# Patient Record
Sex: Male | Born: 1950 | Hispanic: No | Marital: Married | Smoking: Never smoker
Health system: Southern US, Community
[De-identification: ages and names within clinical notes are randomized; demographics above are authoritative.]

## PROBLEM LIST (undated history)

## (undated) DIAGNOSIS — Z86718 Personal history of other venous thrombosis and embolism: Secondary | ICD-10-CM

## (undated) DIAGNOSIS — M24561 Contracture, right knee: Secondary | ICD-10-CM

## (undated) DIAGNOSIS — R002 Palpitations: Secondary | ICD-10-CM

## (undated) DIAGNOSIS — M1990 Unspecified osteoarthritis, unspecified site: Secondary | ICD-10-CM

## (undated) DIAGNOSIS — K219 Gastro-esophageal reflux disease without esophagitis: Secondary | ICD-10-CM

## (undated) DIAGNOSIS — I10 Essential (primary) hypertension: Secondary | ICD-10-CM

## (undated) DIAGNOSIS — Z860100 Personal history of colon polyps, unspecified: Secondary | ICD-10-CM

## (undated) DIAGNOSIS — I739 Peripheral vascular disease, unspecified: Secondary | ICD-10-CM

## (undated) DIAGNOSIS — T8482XA Fibrosis due to internal orthopedic prosthetic devices, implants and grafts, initial encounter: Secondary | ICD-10-CM

## (undated) HISTORY — DX: Gastro-esophageal reflux disease without esophagitis: K21.9

## (undated) HISTORY — DX: Essential (primary) hypertension: I10

## (undated) HISTORY — PX: INGUINAL HERNIA REPAIR: SUR1180

## (undated) HISTORY — DX: Palpitations: R00.2

---

## 2014-07-09 ENCOUNTER — Ambulatory Visit (INDEPENDENT_AMBULATORY_CARE_PROVIDER_SITE_OTHER): Payer: BC Managed Care – PPO | Admitting: Internal Medicine

## 2014-07-09 ENCOUNTER — Encounter: Payer: Self-pay | Admitting: Internal Medicine

## 2014-07-09 ENCOUNTER — Other Ambulatory Visit (HOSPITAL_COMMUNITY): Payer: Self-pay | Admitting: *Deleted

## 2014-07-09 VITALS — BP 110/80 | HR 60 | Ht 68.0 in | Wt 213.0 lb

## 2014-07-09 DIAGNOSIS — Z139 Encounter for screening, unspecified: Secondary | ICD-10-CM

## 2014-07-09 DIAGNOSIS — I493 Ventricular premature depolarization: Secondary | ICD-10-CM

## 2014-07-09 NOTE — Patient Instructions (Signed)
**Note De-Identified Bryan Carroll Obfuscation** Your physician recommends that you continue on your current medications as directed. Please refer to the Current Medication list given to you today.  Your physician has recommended that you wear a 24 hour holter monitor. Holter monitors are medical devices that record the heart's electrical activity. Doctors most often use these monitors to diagnose arrhythmias. Arrhythmias are problems with the speed or rhythm of the heartbeat. The monitor is a small, portable device. You can wear one while you do your normal daily activities. This is usually used to diagnose what is causing palpitations/syncope (passing out).  Dr Tenny Crawoss recommends that you have a Vascuscreen, we will arrange.  Your physician recommends that you schedule a follow-up appointment in: depending on test results

## 2014-07-09 NOTE — Progress Notes (Signed)
HPI Patient is a 63 yo who is referred for evaluation of PVCs Pt has history of HTN  [facility]  Seen recently Pt feels palpitations.  Lately has been drinking 4 cups coffee per day Eating habits have changed No dizziness  Breathing OK   DOesn't exercise   No Known Allergies  Current Outpatient Prescriptions  Medication Sig Dispense Refill  . aspirin 81 MG tablet Take 81 mg by mouth daily.    Marland Kitchen. GARLIC PO Take by mouth 2 (two) times daily.    Marland Kitchen. lisinopril (PRINIVIL,ZESTRIL) 20 MG tablet Take 20 mg by mouth daily.    . Omega-3 Fatty Acids (FISH OIL) 500 MG CAPS Take by mouth.    . Red Yeast Rice 600 MG CAPS Take by mouth.     No current facility-administered medications for this visit.    Past Medical History  Diagnosis Date  . Hypertension   . Palpitations   . Trigeminy   . PVC (premature ventricular contraction)   . GERD (gastroesophageal reflux disease)   . Colon polyp     No past surgical history on file.  Family History  Problem Relation Age of Onset  . Hypertension Mother   . Heart disease Brother   . Heart disease Brother     History   Social History  . Marital Status: Married    Spouse Name: N/A    Number of Children: N/A  . Years of Education: N/A   Occupational History  . Not on file.   Social History Main Topics  . Smoking status: Never Smoker   . Smokeless tobacco: Not on file  . Alcohol Use: 0.6 - 1.2 oz/week    0 Not specified, 1-2 Glasses of wine per week  . Drug Use: No  . Sexual Activity: Not on file   Other Topics Concern  . Not on file   Social History Narrative    Review of Systems:  All systems reviewed.  They are negative to the above problem except as previously stated.  Vital Signs: BP 110/80 mmHg  Pulse 60  Ht 5\' 8"  (1.727 m)  Wt 213 lb (96.616 kg)  BMI 32.39 kg/m2  Physical Exam  HEENT:  Normocephalic, atraumatic. EOMI, PERRLA.  Neck: JVP is normal.  No bruits.  Lungs: clear to auscultation. No rales no  wheezes.  Heart: Regular rate and rhythm. Normal S1, S2. No S3.   No significant murmurs. PMI not displaced.  Abdomen:  Supple, nontender. Normal bowel sounds. No masses. No hepatomegaly.  Extremities:   Good distal pulses throughout. No lower extremity edema.  Musculoskeletal :moving all extremities.  Neuro:   alert and oriented x3.  CN II-XII grossly intact.  EKG 64 pbm  Occasional PAC/PVC  07/05/14 Assessment and Plan:  Palpitations.   Would set up for holter monitor to evaluate PVC burden  If high would set up echo  Dyslipidemia.  On red yeast rice.  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

## 2014-07-18 ENCOUNTER — Encounter: Payer: Self-pay | Admitting: *Deleted

## 2014-07-18 ENCOUNTER — Encounter (INDEPENDENT_AMBULATORY_CARE_PROVIDER_SITE_OTHER): Payer: BC Managed Care – PPO

## 2014-07-18 ENCOUNTER — Ambulatory Visit (HOSPITAL_COMMUNITY): Payer: BC Managed Care – PPO | Attending: Cardiology | Admitting: *Deleted

## 2014-07-18 DIAGNOSIS — Z139 Encounter for screening, unspecified: Secondary | ICD-10-CM

## 2014-07-18 DIAGNOSIS — Z0000 Encounter for general adult medical examination without abnormal findings: Secondary | ICD-10-CM

## 2014-07-18 DIAGNOSIS — I493 Ventricular premature depolarization: Secondary | ICD-10-CM

## 2014-07-18 NOTE — Progress Notes (Signed)
vascuscreen (aorta carotid abi) performed

## 2014-07-18 NOTE — Progress Notes (Signed)
Patient ID: Bryan Carroll, male   DOB: 08-23-1950, 63 y.o.   MRN: [id number] [employer] 24 hour holter monitor applied to patient.

## 2014-08-19 ENCOUNTER — Telehealth: Payer: Self-pay | Admitting: *Deleted

## 2014-08-19 DIAGNOSIS — I6522 Occlusion and stenosis of left carotid artery: Secondary | ICD-10-CM

## 2014-08-19 NOTE — Telephone Encounter (Signed)
Message     Patinet seen in December    I just now saw results of vascular screen he had     There appears to be mild cholesterol plaquing of L internal carotid artery    With this, patinet needs tighter control of cholesterol so it doesn't worsen    He is on red yeast rice I would stop that since not clear how much he is getting of active agent. Needs more Controlled amount    WOuld recomm lipitor 10 mg F/U lipid panel in 8 weeks with AST

## 2014-08-19 NOTE — Telephone Encounter (Signed)
24 hr holter reviewed by Dr. Tenny Crawoss: " Sinus rhythm - rates 47 to 108 bpm; average HR 61 bpm Rare PAC & rare PVC No sig pauses No diary entries Rec: no change; holter is normal."

## 2014-08-19 NOTE — Telephone Encounter (Signed)
Left message for patient to call back  

## 2014-09-19 MED ORDER — ATORVASTATIN CALCIUM 10 MG PO TABS: 10.0000 mg | ORAL_TABLET | Freq: Every day | ORAL | Status: DC

## 2014-09-19 NOTE — Telephone Encounter (Signed)
Spoke with patient who is out of the country until 10/15/14. He will pick up Lipitor when he returns. He will discontinue the red yeast rice at that time.  Setup up for blood work for May.  Pt unable to schedule the appointment at this time. Pt is appreciative for the call.

## 2016-04-08 DIAGNOSIS — M25561 Pain in right knee: Secondary | ICD-10-CM | POA: Diagnosis not present

## 2016-04-08 DIAGNOSIS — Z23 Encounter for immunization: Secondary | ICD-10-CM | POA: Diagnosis not present

## 2016-04-08 DIAGNOSIS — L309 Dermatitis, unspecified: Secondary | ICD-10-CM | POA: Diagnosis not present

## 2016-04-09 ENCOUNTER — Other Ambulatory Visit: Payer: Self-pay | Admitting: Family Medicine

## 2016-04-09 ENCOUNTER — Ambulatory Visit
Admission: RE | Admit: 2016-04-09 | Discharge: 2016-04-09 | Disposition: A | Discharge status: Home / Self Care | Payer: BLUE CROSS/BLUE SHIELD | Source: Ambulatory Visit | Attending: Family Medicine | Admitting: Family Medicine

## 2016-04-09 DIAGNOSIS — M179 Osteoarthritis of knee, unspecified: Secondary | ICD-10-CM | POA: Diagnosis not present

## 2016-04-09 DIAGNOSIS — M25561 Pain in right knee: Secondary | ICD-10-CM

## 2016-07-02 DIAGNOSIS — H1045 Other chronic allergic conjunctivitis: Secondary | ICD-10-CM | POA: Diagnosis not present

## 2016-11-18 DIAGNOSIS — M25561 Pain in right knee: Secondary | ICD-10-CM | POA: Diagnosis not present

## 2016-11-18 DIAGNOSIS — E785 Hyperlipidemia, unspecified: Secondary | ICD-10-CM | POA: Diagnosis not present

## 2016-11-25 DIAGNOSIS — M170 Bilateral primary osteoarthritis of knee: Secondary | ICD-10-CM | POA: Diagnosis not present

## 2016-11-25 DIAGNOSIS — M25561 Pain in right knee: Secondary | ICD-10-CM | POA: Diagnosis not present

## 2017-02-08 DIAGNOSIS — M25561 Pain in right knee: Secondary | ICD-10-CM | POA: Diagnosis not present

## 2017-03-30 DIAGNOSIS — R6882 Decreased libido: Secondary | ICD-10-CM | POA: Diagnosis not present

## 2017-03-30 DIAGNOSIS — E785 Hyperlipidemia, unspecified: Secondary | ICD-10-CM | POA: Diagnosis not present

## 2017-03-30 DIAGNOSIS — Z23 Encounter for immunization: Secondary | ICD-10-CM | POA: Diagnosis not present

## 2017-03-30 DIAGNOSIS — M179 Osteoarthritis of knee, unspecified: Secondary | ICD-10-CM | POA: Diagnosis not present

## 2017-03-30 DIAGNOSIS — Z01818 Encounter for other preprocedural examination: Secondary | ICD-10-CM | POA: Diagnosis not present

## 2017-05-02 ENCOUNTER — Ambulatory Visit: Payer: Self-pay | Admitting: Orthopedic Surgery

## 2017-05-17 ENCOUNTER — Other Ambulatory Visit (HOSPITAL_COMMUNITY): Payer: Self-pay | Admitting: Emergency Medicine

## 2017-05-17 NOTE — Patient Instructions (Addendum)
Gianlucca Szymborski  05/17/2017   Your procedure is scheduled on: 05-26-17  Report to [facility] Main  Entrance Take [place]  elevators to 3rd floor to  Short Stay Center at [phone].   Call this number if you have problems the morning of surgery [phone]    Remember: ONLY 1 PERSON MAY GO WITH YOU TO SHORT STAY TO GET  READY MORNING OF YOUR SURGERY.  Do not eat food or drink liquids :After Midnight.     Take these medicines the morning of surgery with A SIP OF WATER: NONE                                You may not have any metal on your body including hair pins and              piercings  Do not wear jewelry, make-up, lotions, powders or perfumes, deodorant                         Men may shave face and neck.   Do not bring valuables to the hospital. [facility] IS NOT             RESPONSIBLE   FOR VALUABLES.  Contacts, dentures or bridgework may not be worn into surgery.  Leave suitcase in the car. After surgery it may be brought to your room.                Please read over the following fact sheets you were given: _____________________________________________________________________            [facility] - Preparing for Surgery Before surgery, you can play an important role.  Because skin is not sterile, your skin needs to be as free of germs as possible.  You can reduce the number of germs on your skin by washing with CHG (chlorahexidine gluconate) soap before surgery.  CHG is an antiseptic cleaner which kills germs and bonds with the skin to continue killing germs even after washing. Please DO NOT use if you have an allergy to CHG or antibacterial soaps.  If your skin becomes reddened/irritated stop using the CHG and inform your nurse when you arrive at Short Stay. Do not shave (including legs and underarms) for at least 48 hours prior to the first CHG shower.  You may shave your face/neck. Please follow these instructions carefully:  1.  Shower  with CHG Soap the night before surgery and the  morning of Surgery.  2.  If you choose to wash your hair, wash your hair first as usual with your  normal  shampoo.  3.  After you shampoo, rinse your hair and body thoroughly to remove the  shampoo.                           4.  Use CHG as you would any other liquid soap.  You can apply chg directly  to the skin and wash                       Gently with a scrungie or clean washcloth.  5.  Apply the CHG Soap to your body ONLY FROM THE NECK DOWN.   Do not use on face/ open  Wound or open sores. Avoid contact with eyes, ears mouth and genitals (private parts).                       Wash face,  Genitals (private parts) with your normal soap.             6.  Wash thoroughly, paying special attention to the area where your surgery  will be performed.  7.  Thoroughly rinse your body with warm water from the neck down.  8.  DO NOT shower/wash with your normal soap after using and rinsing off  the CHG Soap.                9.  Pat yourself dry with a clean towel.            10.  Wear clean pajamas.            11.  Place clean sheets on your bed the night of your first shower and do not  sleep with pets. Day of Surgery : Do not apply any lotions/deodorants the morning of surgery.  Please wear clean clothes to the hospital/surgery center.  FAILURE TO FOLLOW THESE INSTRUCTIONS MAY RESULT IN THE CANCELLATION OF YOUR SURGERY PATIENT SIGNATURE_________________________________  NURSE SIGNATURE__________________________________  ________________________________________________________________________   Adam Phenix  An incentive spirometer is a tool that can help keep your lungs clear and active. This tool measures how well you are filling your lungs with each breath. Taking long deep breaths may help reverse or decrease the chance of developing breathing (pulmonary) problems (especially infection) following:  A long period  of time when you are unable to move or be active. BEFORE THE PROCEDURE   If the spirometer includes an indicator to show your best effort, your nurse or respiratory therapist will set it to a desired goal.  If possible, sit up straight or lean slightly forward. Try not to slouch.  Hold the incentive spirometer in an upright position. INSTRUCTIONS FOR USE  1. Sit on the edge of your bed if possible, or sit up as far as you can in bed or on a chair. 2. Hold the incentive spirometer in an upright position. 3. Breathe out normally. 4. Place the mouthpiece in your mouth and seal your lips tightly around it. 5. Breathe in slowly and as deeply as possible, raising the piston or the ball toward the top of the column. 6. Hold your breath for 3-5 seconds or for as long as possible. Allow the piston or ball to fall to the bottom of the column. 7. Remove the mouthpiece from your mouth and breathe out normally. 8. Rest for a few seconds and repeat Steps 1 through 7 at least 10 times every 1-2 hours when you are awake. Take your time and take a few normal breaths between deep breaths. 9. The spirometer may include an indicator to show your best effort. Use the indicator as a goal to work toward during each repetition. 10. After each set of 10 deep breaths, practice coughing to be sure your lungs are clear. If you have an incision (the cut made at the time of surgery), support your incision when coughing by placing a pillow or rolled up towels firmly against it. Once you are able to get out of bed, walk around indoors and cough well. You may stop using the incentive spirometer when instructed by your caregiver.  RISKS AND COMPLICATIONS  Take your time so you do not get  dizzy or light-headed.  If you are in pain, you may need to take or ask for pain medication before doing incentive spirometry. It is harder to take a deep breath if you are having pain. AFTER USE  Rest and breathe slowly and easily.  It  can be helpful to keep track of a log of your progress. Your caregiver can provide you with a simple table to help with this. If you are using the spirometer at home, follow these instructions: SEEK MEDICAL CARE IF:   You are having difficultly using the spirometer.  You have trouble using the spirometer as often as instructed.  Your pain medication is not giving enough relief while using the spirometer.  You develop fever of 100.5 F (38.1 C) or higher. SEEK IMMEDIATE MEDICAL CARE IF:   You cough up bloody sputum that had not been present before.  You develop fever of 102 F (38.9 C) or greater.  You develop worsening pain at or near the incision site. MAKE SURE YOU:   Understand these instructions.  Will watch your condition.  Will get help right away if you are not doing well or get worse. Document Released: 11/29/2006 Document Revised: 10/11/2011 Document Reviewed: 01/30/2007 [facility] Patient Information 2014 [place], [place].

## 2017-05-17 NOTE — Progress Notes (Signed)
LOV cardiology Dr Dietrich Pates 07-09-14 epic

## 2017-05-18 ENCOUNTER — Ambulatory Visit: Payer: Self-pay | Admitting: Orthopedic Surgery

## 2017-05-18 NOTE — H&P (Signed)
Bryan Carroll DOB: 06/06/1951 Married / Language: English / Race: White Male  H&P Date: 05/18/17  Chief Complaint: Right knee pain  History of Present Illness The patient is a 66 year old male who comes in today for a preoperative History and Physical. The patient is scheduled for a right total knee arthroplasty to be performed by Dr. Javier Docker, MD at [facility] on 05/26/2017. Bryan Carroll reports chronic progressively worsening R knee pain refractory to injection therapy, bracing, activity modifications, quad strengthening, medications, and relative rest. Pain is limiting ADLs and quality of life at this point and he desires to proceed with surgery.  Dr. Shelle Iron and the patient mutually agreed to proceed with a total knee replacement. Risks and benefits of the procedure were discussed including stiffness, suboptimal range of motion, persistent pain, infection requiring removal of prosthesis and reinsertion, need for prophylactic antibiotics in the future, for example, dental procedures, possible need for manipulation, revision in the future and also anesthetic complications including DVT, PE, etc. We discussed the perioperative course, time in the hospital, postoperative recovery and the need for elevation to control swelling. We also discussed the predicted range of motion and the probability that squatting and kneeling would be unobtainable in the future. In addition, postoperative anticoagulation was discussed. We have obtained preoperative medical clearance as necessary. Provided illustrated handout and discussed it in detail. They will enroll in the total joint replacement educational forum at the hospital.  Bryan Carroll.  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 Post-Surgical Plans  Home with HHPT. wife as caregiver  Medication History Fish Oil (1000MG  Capsule, Oral) Active. Lisinopril (20MG  Tablet, Oral) Active. Aspirin (81MG  Tablet, Oral) Active.  Past Surgical History Hernia Repair   Physical Exam General Mental Status -Alert, cooperative and good historian. General Appearance-pleasant, Not in acute distress. Orientation-Oriented X3. Build & Nutrition-Well nourished and Well developed.  Head and Neck Head-normocephalic, atraumatic . Neck Global Assessment - supple, no bruit auscultated on the right, no bruit auscultated on the left.  Eye Pupil - Bilateral-Regular and Round. Motion - Bilateral-EOMI.  Chest and Lung Exam Auscultation Breath sounds - clear at anterior chest wall and clear at posterior chest wall. Adventitious sounds - No Adventitious sounds.  Cardiovascular Auscultation Rhythm - Regular rate and rhythm. Heart Sounds - S1 WNL and S2 WNL. Murmurs & Other Heart Sounds - Auscultation of the heart reveals - No Murmurs.  Abdomen Palpation/Percussion Tenderness - Abdomen is non-tender to palpation. Rigidity (guarding) - Abdomen is soft. Auscultation Auscultation of the abdomen reveals - Bowel sounds normal.  Male Genitourinary Not done, not pertinent to present illness   Musculoskeletal On exam, he walks with a slightly antalgic gait. Mood and affect is appropriate. Slight varus thrust. Tender in the medial joint line. Patellofemoral pain with compression. Trace effusion. Patellofemoral crepitus.  Knee exam on  inspection reveals no evidence of soft tissue swelling, ecchymosis, deformity or erythema.  On palpation there is no tenderness in the lateral joint line. Nontender over the fibular head or the peroneal nerve. Nontender over the quadriceps insertion of the patellar ligament insertion. The range of motion was full. Provocative maneuvers revealed a negative Lachman, negative anterior and posterior drawer and a negative McMurray. No instability was noted with varus and valgus stressing at 0 or 30 degrees. On manual motor test the quadriceps and hamstrings were 5/5. Sensory exam was intact to light touch.  Imaging X-rays demonstrate end-stage osteoarthrosis, medial compartment, particularly of the knee. Slight varus deformity.  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 Shelle IronBeane on 05/26/17. We again discussed the procedure itself as well as risks, complications and alternatives, including but not limited to DVT, PE, infx, bleeding, failure of procedure, need for secondary procedure including manipulation, nerve injury, ongoing pain/symptoms, anesthesia risk, even stroke or death. Also discussed typical post-op protocols, activity restrictions, need for PT, flexion/extension exercises, time out of work. Discussed need for DVT ppx post-op per protocol. Discussed dental ppx and infx prevention. Also discussed limitations post-operatively such as kneeling and squatting. All questions were answered. Patient desires to proceed with surgery as scheduled. Will hold supplements, ASA and NSAIDs accordingly. Will remain NPO after MN night before surgery. [facility] as scheduled. Anticipate hospital stay to include at least 2 midnights given medical history and to ensure proper pain control. Plan ASA 325mg  BID for DVT ppx post-op. Plan pain medication,  Robaxin, Colace, Miralax. Plan home with HHPT post-op with family members at home for assistance. Will follow up 10-14 days post-op for staple removal and xrays.  Plan right total knee replacement  Signed electronically by Dorothy SparkJaclyn M Desirea Mizrahi, PA-C for Dr. Shelle IronBeane

## 2017-05-20 ENCOUNTER — Encounter (HOSPITAL_COMMUNITY): Payer: Self-pay

## 2017-05-20 ENCOUNTER — Encounter (HOSPITAL_COMMUNITY)
Admission: RE | Admit: 2017-05-20 | Discharge: 2017-05-20 | Disposition: A | Discharge status: Home / Self Care | Payer: BLUE CROSS/BLUE SHIELD | Source: Ambulatory Visit | Attending: Specialist | Admitting: Specialist

## 2017-05-20 DIAGNOSIS — Z01812 Encounter for preprocedural laboratory examination: Secondary | ICD-10-CM | POA: Diagnosis not present

## 2017-05-20 DIAGNOSIS — M1711 Unilateral primary osteoarthritis, right knee: Secondary | ICD-10-CM | POA: Diagnosis not present

## 2017-05-20 LAB — BASIC METABOLIC PANEL
Anion gap: 7 (ref 5–15)
BUN: 14 mg/dL (ref 6–20)
CO2: 28 mmol/L (ref 22–32)
Calcium: 9.4 mg/dL (ref 8.9–10.3)
Chloride: 104 mmol/L (ref 101–111)
Creatinine, Ser: 0.87 mg/dL (ref 0.61–1.24)
GFR calc Af Amer: >60 mL/min (ref >60)
GLUCOSE: 106 mg/dL — AB (ref 65–99)
POTASSIUM: 4.7 mmol/L (ref 3.5–5.1)
Sodium: 139 mmol/L (ref 135–145)

## 2017-05-20 LAB — URINALYSIS, ROUTINE W REFLEX MICROSCOPIC
Bilirubin Urine: NEGATIVE
GLUCOSE, UA: NEGATIVE mg/dL
Hgb urine dipstick: NEGATIVE
KETONES UR: NEGATIVE mg/dL
Leukocytes, UA: NEGATIVE
Nitrite: NEGATIVE
PROTEIN: NEGATIVE mg/dL
Specific Gravity, Urine: 1.003 — ABNORMAL LOW (ref 1.005–1.030)
pH: 6 (ref 5.0–8.0)

## 2017-05-20 LAB — CBC
HEMATOCRIT: 48.6 % (ref 39.0–52.0)
Hemoglobin: 16.5 g/dL (ref 13.0–17.0)
MCH: 29.9 pg (ref 26.0–34.0)
MCHC: 34 g/dL (ref 30.0–36.0)
MCV: 88 fL (ref 78.0–100.0)
Platelets: 163 10*3/uL (ref 150–400)
RBC: 5.52 MIL/uL (ref 4.22–5.81)
RDW: 13 % (ref 11.5–15.5)
WBC: 6.7 10*3/uL (ref 4.0–10.5)

## 2017-05-20 LAB — PROTIME-INR
INR: 0.91
PROTHROMBIN TIME: 12.1 s (ref 11.4–15.2)

## 2017-05-20 LAB — SURGICAL PCR SCREEN
MRSA, PCR: NEGATIVE
Staphylococcus aureus: NEGATIVE

## 2017-05-20 LAB — APTT: APTT: 33 s (ref 24–36)

## 2017-05-20 NOTE — Progress Notes (Signed)
   05/20/17 0953  OBSTRUCTIVE SLEEP APNEA  Have you ever been diagnosed with sleep apnea through a sleep study? No  Do you snore loudly (loud enough to be heard through closed doors)?  1  Do you often feel tired, fatigued, or sleepy during the daytime (such as falling asleep during driving or talking to someone)? 0  Has anyone observed you stop breathing during your sleep? 0  Do you have, or are you being treated for high blood pressure? 1  BMI more than 35 kg/m2? 0  Age > 50 (1-yes) 1  Neck circumference greater than:Male 16 inches or larger, Male 17inches or larger? 1  Male Gender (Yes=1) 1  Obstructive Sleep Apnea Score 5

## 2017-05-24 NOTE — Progress Notes (Signed)
EKG 03-30-17 [facility] [place]

## 2017-05-25 ENCOUNTER — Encounter (HOSPITAL_COMMUNITY): Payer: Self-pay | Admitting: Anesthesiology

## 2017-05-25 NOTE — Anesthesia Preprocedure Evaluation (Addendum)
Anesthesia Evaluation  Patient identified by MRN, date of birth, ID band Patient awake    Reviewed: Allergy & Precautions, NPO status , Patient's Chart, lab work & pertinent test results  Airway Mallampati: I  TM Distance: >3 FB Neck ROM: Full    Dental no notable dental hx. (+) Teeth Intact, Dental Advisory Given   Pulmonary neg pulmonary ROS,    Pulmonary exam normal breath sounds clear to auscultation       Cardiovascular hypertension, Pt. on medications Normal cardiovascular exam+ dysrhythmias  Rhythm:Regular Rate:Bradycardia     Neuro/Psych negative neurological ROS  negative psych ROS   GI/Hepatic hiatal hernia, GERD  Medicated and Controlled,  Endo/Other  negative endocrine ROS  Renal/GU negative Renal ROS  negative genitourinary   Musculoskeletal  (+) Arthritis , Osteoarthritis,  OA left knee   Abdominal (+) + obese,   Peds  Hematology negative hematology ROS (+)   Anesthesia Other Findings   Reproductive/Obstetrics                           Anesthesia Physical Anesthesia Plan  ASA: II  Anesthesia Plan: Spinal   Post-op Pain Management:  Regional for Post-op pain   Induction:   PONV Risk Score and Plan: 3 and Ondansetron, Dexamethasone, Midazolam, Propofol infusion and Treatment may vary due to age or medical condition  Airway Management Planned:   Additional Equipment:   Intra-op Plan:   Post-operative Plan:   Informed Consent: I have reviewed the patients History and Physical, chart, labs and discussed the procedure including the risks, benefits and alternatives for the proposed anesthesia with the patient or authorized representative who has indicated his/her understanding and acceptance.     Plan Discussed with:   Anesthesia Plan Comments:        Anesthesia Quick Evaluation

## 2017-05-26 ENCOUNTER — Encounter (HOSPITAL_COMMUNITY)
Admission: RE | Disposition: A | Discharge status: Home Health | Payer: Self-pay | Source: Ambulatory Visit | Attending: Specialist

## 2017-05-26 ENCOUNTER — Inpatient Hospital Stay (HOSPITAL_COMMUNITY): Payer: BLUE CROSS/BLUE SHIELD | Admitting: Anesthesiology

## 2017-05-26 ENCOUNTER — Inpatient Hospital Stay (HOSPITAL_COMMUNITY)
Admission: RE | Admit: 2017-05-26 | Discharge: 2017-05-29 | DRG: 470 | Disposition: A | Discharge status: Home Health | Payer: BLUE CROSS/BLUE SHIELD | Source: Ambulatory Visit | Attending: Specialist | Admitting: Specialist

## 2017-05-26 ENCOUNTER — Observation Stay (HOSPITAL_COMMUNITY): Payer: BLUE CROSS/BLUE SHIELD

## 2017-05-26 ENCOUNTER — Encounter (HOSPITAL_COMMUNITY): Payer: Self-pay

## 2017-05-26 DIAGNOSIS — K219 Gastro-esophageal reflux disease without esophagitis: Secondary | ICD-10-CM | POA: Diagnosis not present

## 2017-05-26 DIAGNOSIS — M21161 Varus deformity, not elsewhere classified, right knee: Secondary | ICD-10-CM | POA: Diagnosis present

## 2017-05-26 DIAGNOSIS — Z6832 Body mass index (BMI) 32.0-32.9, adult: Secondary | ICD-10-CM

## 2017-05-26 DIAGNOSIS — Z8249 Family history of ischemic heart disease and other diseases of the circulatory system: Secondary | ICD-10-CM

## 2017-05-26 DIAGNOSIS — Z96651 Presence of right artificial knee joint: Secondary | ICD-10-CM | POA: Diagnosis not present

## 2017-05-26 DIAGNOSIS — M25561 Pain in right knee: Secondary | ICD-10-CM | POA: Diagnosis not present

## 2017-05-26 DIAGNOSIS — Z471 Aftercare following joint replacement surgery: Secondary | ICD-10-CM | POA: Diagnosis not present

## 2017-05-26 DIAGNOSIS — Z96659 Presence of unspecified artificial knee joint: Secondary | ICD-10-CM

## 2017-05-26 DIAGNOSIS — E7800 Pure hypercholesterolemia, unspecified: Secondary | ICD-10-CM | POA: Diagnosis not present

## 2017-05-26 DIAGNOSIS — I10 Essential (primary) hypertension: Secondary | ICD-10-CM | POA: Diagnosis not present

## 2017-05-26 DIAGNOSIS — M1711 Unilateral primary osteoarthritis, right knee: Secondary | ICD-10-CM | POA: Diagnosis not present

## 2017-05-26 DIAGNOSIS — E669 Obesity, unspecified: Secondary | ICD-10-CM | POA: Diagnosis present

## 2017-05-26 DIAGNOSIS — K449 Diaphragmatic hernia without obstruction or gangrene: Secondary | ICD-10-CM | POA: Diagnosis not present

## 2017-05-26 DIAGNOSIS — G8918 Other acute postprocedural pain: Secondary | ICD-10-CM | POA: Diagnosis not present

## 2017-05-26 HISTORY — PX: TOTAL KNEE ARTHROPLASTY: SHX125

## 2017-05-26 SURGERY — ARTHROPLASTY, KNEE, TOTAL
Anesthesia: Spinal | Site: Knee | Laterality: Right

## 2017-05-26 MED ORDER — DOCUSATE SODIUM 100 MG PO CAPS: 100.0000 mg | ORAL_CAPSULE | Freq: Two times a day (BID) | ORAL | 1 refills | Status: DC | PRN

## 2017-05-26 MED ORDER — SODIUM CHLORIDE 0.9 % IR SOLN
Status: DC | PRN
  Administered 2017-05-26: 2000 mL

## 2017-05-26 MED ORDER — BUPIVACAINE IN DEXTROSE 0.75-8.25 % IT SOLN
INTRATHECAL | Status: DC | PRN
  Administered 2017-05-26: 2 mL via INTRATHECAL

## 2017-05-26 MED ORDER — ACETAMINOPHEN 500 MG PO TABS
1000.0000 mg | ORAL_TABLET | Freq: Four times a day (QID) | ORAL | Status: AC
  Administered 2017-05-26 – 2017-05-27 (×4): 1000 mg via ORAL
  Filled 2017-05-26 (×4): qty 2

## 2017-05-26 MED ORDER — MIDAZOLAM HCL 2 MG/2ML IJ SOLN
INTRAMUSCULAR | Status: DC | PRN
  Administered 2017-05-26: 2 mg via INTRAVENOUS

## 2017-05-26 MED ORDER — DOCUSATE SODIUM 100 MG PO CAPS
100.0000 mg | ORAL_CAPSULE | Freq: Two times a day (BID) | ORAL | Status: DC
  Administered 2017-05-26 – 2017-05-29 (×6): 100 mg via ORAL
  Filled 2017-05-26 (×6): qty 1

## 2017-05-26 MED ORDER — DEXAMETHASONE SODIUM PHOSPHATE 10 MG/ML IJ SOLN
INTRAMUSCULAR | Status: AC
  Filled 2017-05-26: qty 1

## 2017-05-26 MED ORDER — PROMETHAZINE HCL 25 MG/ML IJ SOLN: 6.2500 mg | INTRAMUSCULAR | Status: DC | PRN

## 2017-05-26 MED ORDER — FENTANYL CITRATE (PF) 100 MCG/2ML IJ SOLN
INTRAMUSCULAR | Status: AC
  Filled 2017-05-26: qty 2

## 2017-05-26 MED ORDER — METOCLOPRAMIDE HCL 5 MG/ML IJ SOLN: 5.0000 mg | Freq: Three times a day (TID) | INTRAMUSCULAR | Status: DC | PRN

## 2017-05-26 MED ORDER — EPHEDRINE SULFATE-NACL 50-0.9 MG/10ML-% IV SOSY
PREFILLED_SYRINGE | INTRAVENOUS | Status: DC | PRN
  Administered 2017-05-26: 5 mg via INTRAVENOUS

## 2017-05-26 MED ORDER — ONDANSETRON HCL 4 MG/2ML IJ SOLN
INTRAMUSCULAR | Status: DC | PRN
  Administered 2017-05-26: 4 mg via INTRAVENOUS

## 2017-05-26 MED ORDER — PROPOFOL 500 MG/50ML IV EMUL
INTRAVENOUS | Status: DC | PRN
Start: 2017-05-26 — End: 2017-05-26
  Administered 2017-05-26: 75 ug/kg/min via INTRAVENOUS

## 2017-05-26 MED ORDER — ROPIVACAINE HCL 7.5 MG/ML IJ SOLN
INTRAMUSCULAR | Status: DC | PRN
  Administered 2017-05-26: 20 mL via PERINEURAL

## 2017-05-26 MED ORDER — SODIUM CHLORIDE 0.9 % IV SOLN
1000.0000 mg | INTRAVENOUS | Status: AC
  Administered 2017-05-26: 1000 mg via INTRAVENOUS
  Filled 2017-05-26: qty 1100

## 2017-05-26 MED ORDER — PROPOFOL 10 MG/ML IV BOLUS
INTRAVENOUS | Status: AC
  Filled 2017-05-26: qty 20

## 2017-05-26 MED ORDER — SODIUM CHLORIDE 0.9 % IV SOLN
INTRAVENOUS | Status: AC
  Filled 2017-05-26: qty 500000

## 2017-05-26 MED ORDER — METOCLOPRAMIDE HCL 5 MG PO TABS: 5.0000 mg | ORAL_TABLET | Freq: Three times a day (TID) | ORAL | Status: DC | PRN

## 2017-05-26 MED ORDER — DIPHENHYDRAMINE HCL 12.5 MG/5ML PO ELIX: 12.5000 mg | ORAL_SOLUTION | ORAL | Status: DC | PRN

## 2017-05-26 MED ORDER — OXYCODONE HCL 5 MG PO TABS
5.0000 mg | ORAL_TABLET | ORAL | Status: DC | PRN
  Administered 2017-05-27 – 2017-05-29 (×7): 5 mg via ORAL
  Filled 2017-05-26 (×6): qty 1

## 2017-05-26 MED ORDER — ASPIRIN EC 325 MG PO TBEC: 325.0000 mg | DELAYED_RELEASE_TABLET | Freq: Two times a day (BID) | ORAL | 1 refills | Status: DC

## 2017-05-26 MED ORDER — LIDOCAINE 2% (20 MG/ML) 5 ML SYRINGE
INTRAMUSCULAR | Status: AC
  Filled 2017-05-26: qty 5

## 2017-05-26 MED ORDER — ASPIRIN EC 325 MG PO TBEC
325.0000 mg | DELAYED_RELEASE_TABLET | Freq: Two times a day (BID) | ORAL | Status: DC
  Administered 2017-05-26 – 2017-05-29 (×6): 325 mg via ORAL
  Filled 2017-05-26 (×6): qty 1

## 2017-05-26 MED ORDER — ONDANSETRON HCL 4 MG/2ML IJ SOLN
INTRAMUSCULAR | Status: AC
  Filled 2017-05-26: qty 2

## 2017-05-26 MED ORDER — PROPOFOL 10 MG/ML IV BOLUS
INTRAVENOUS | Status: AC
Start: 2017-05-26 — End: 2017-05-26
  Filled 2017-05-26: qty 40

## 2017-05-26 MED ORDER — FENTANYL CITRATE (PF) 100 MCG/2ML IJ SOLN
INTRAMUSCULAR | Status: DC | PRN
  Administered 2017-05-26 (×2): 50 ug via INTRAVENOUS

## 2017-05-26 MED ORDER — RISAQUAD PO CAPS
1.0000 | ORAL_CAPSULE | Freq: Every day | ORAL | Status: DC
  Administered 2017-05-26 – 2017-05-29 (×4): 1 via ORAL
  Filled 2017-05-26 (×4): qty 1

## 2017-05-26 MED ORDER — PROPOFOL 10 MG/ML IV BOLUS
INTRAVENOUS | Status: AC
  Filled 2017-05-26: qty 40

## 2017-05-26 MED ORDER — OXYCODONE-ACETAMINOPHEN 5-325 MG PO TABS: 1.0000 | ORAL_TABLET | ORAL | 0 refills | Status: DC | PRN

## 2017-05-26 MED ORDER — POLYETHYLENE GLYCOL 3350 17 G PO PACK: 17.0000 g | PACK | Freq: Every day | ORAL | 0 refills | Status: DC

## 2017-05-26 MED ORDER — BISACODYL 5 MG PO TBEC: 5.0000 mg | DELAYED_RELEASE_TABLET | Freq: Every day | ORAL | Status: DC | PRN

## 2017-05-26 MED ORDER — MAGNESIUM CITRATE PO SOLN: 1.0000 | Freq: Once | ORAL | Status: DC | PRN

## 2017-05-26 MED ORDER — METHOCARBAMOL 1000 MG/10ML IJ SOLN
500.0000 mg | Freq: Four times a day (QID) | INTRAVENOUS | Status: DC | PRN
  Filled 2017-05-26: qty 5

## 2017-05-26 MED ORDER — LACTATED RINGERS IV SOLN
INTRAVENOUS | Status: DC
  Administered 2017-05-26 (×2): via INTRAVENOUS

## 2017-05-26 MED ORDER — MEPERIDINE HCL 50 MG/ML IJ SOLN: 6.2500 mg | INTRAMUSCULAR | Status: DC | PRN

## 2017-05-26 MED ORDER — OXYCODONE HCL 5 MG PO TABS
10.0000 mg | ORAL_TABLET | ORAL | Status: DC | PRN
  Administered 2017-05-26 – 2017-05-27 (×3): 10 mg via ORAL
  Filled 2017-05-26 (×4): qty 2

## 2017-05-26 MED ORDER — METHOCARBAMOL 500 MG PO TABS
500.0000 mg | ORAL_TABLET | Freq: Four times a day (QID) | ORAL | Status: DC | PRN
  Administered 2017-05-27: 500 mg via ORAL
  Filled 2017-05-26 (×2): qty 1

## 2017-05-26 MED ORDER — CEFAZOLIN SODIUM-DEXTROSE 2-4 GM/100ML-% IV SOLN
2.0000 g | Freq: Four times a day (QID) | INTRAVENOUS | Status: AC
  Administered 2017-05-26 – 2017-05-27 (×3): 2 g via INTRAVENOUS
  Filled 2017-05-26 (×3): qty 100

## 2017-05-26 MED ORDER — PHENOL 1.4 % MT LIQD: 1.0000 | OROMUCOSAL | Status: DC | PRN

## 2017-05-26 MED ORDER — DEXAMETHASONE SODIUM PHOSPHATE 10 MG/ML IJ SOLN
INTRAMUSCULAR | Status: DC | PRN
  Administered 2017-05-26: 10 mg via INTRAVENOUS

## 2017-05-26 MED ORDER — ALUM & MAG HYDROXIDE-SIMETH 200-200-20 MG/5ML PO SUSP
30.0000 mL | ORAL | Status: DC | PRN
  Administered 2017-05-28 – 2017-05-29 (×2): 30 mL via ORAL
  Filled 2017-05-26 (×2): qty 30

## 2017-05-26 MED ORDER — CEFAZOLIN SODIUM-DEXTROSE 2-4 GM/100ML-% IV SOLN
2.0000 g | INTRAVENOUS | Status: AC
  Administered 2017-05-26: 2 g via INTRAVENOUS
  Filled 2017-05-26: qty 100

## 2017-05-26 MED ORDER — SODIUM CHLORIDE 0.9 % IV SOLN
INTRAVENOUS | Status: DC | PRN
  Administered 2017-05-26: 500 mL

## 2017-05-26 MED ORDER — LIDOCAINE 2% (20 MG/ML) 5 ML SYRINGE
INTRAMUSCULAR | Status: DC | PRN
  Administered 2017-05-26: 40 mg via INTRAVENOUS

## 2017-05-26 MED ORDER — ACETAMINOPHEN 10 MG/ML IV SOLN
1000.0000 mg | INTRAVENOUS | Status: AC
  Administered 2017-05-26: 1000 mg via INTRAVENOUS
  Filled 2017-05-26: qty 100

## 2017-05-26 MED ORDER — ONDANSETRON HCL 4 MG/2ML IJ SOLN: 4.0000 mg | Freq: Four times a day (QID) | INTRAMUSCULAR | Status: DC | PRN

## 2017-05-26 MED ORDER — ONDANSETRON HCL 4 MG PO TABS: 4.0000 mg | ORAL_TABLET | Freq: Four times a day (QID) | ORAL | Status: DC | PRN

## 2017-05-26 MED ORDER — EPHEDRINE 5 MG/ML INJ
INTRAVENOUS | Status: AC
  Filled 2017-05-26: qty 10

## 2017-05-26 MED ORDER — POLYETHYLENE GLYCOL 3350 17 G PO PACK: 17.0000 g | PACK | Freq: Every day | ORAL | Status: DC | PRN

## 2017-05-26 MED ORDER — ACETAMINOPHEN 325 MG PO TABS
650.0000 mg | ORAL_TABLET | ORAL | Status: DC | PRN
  Administered 2017-05-27 – 2017-05-28 (×2): 650 mg via ORAL
  Filled 2017-05-26 (×2): qty 2

## 2017-05-26 MED ORDER — MENTHOL 3 MG MT LOZG: 1.0000 | LOZENGE | OROMUCOSAL | Status: DC | PRN

## 2017-05-26 MED ORDER — KCL IN DEXTROSE-NACL 20-5-0.45 MEQ/L-%-% IV SOLN
INTRAVENOUS | Status: AC
  Administered 2017-05-26: 14:00:00 via INTRAVENOUS
  Filled 2017-05-26 (×2): qty 1000

## 2017-05-26 MED ORDER — BUPIVACAINE-EPINEPHRINE 0.25% -1:200000 IJ SOLN
INTRAMUSCULAR | Status: AC
  Filled 2017-05-26: qty 1

## 2017-05-26 MED ORDER — BUPIVACAINE-EPINEPHRINE 0.25% -1:200000 IJ SOLN
INTRAMUSCULAR | Status: DC | PRN
  Administered 2017-05-26: 40 mL

## 2017-05-26 MED ORDER — PROPOFOL 10 MG/ML IV BOLUS
INTRAVENOUS | Status: DC | PRN
  Administered 2017-05-26 (×3): 20 mg via INTRAVENOUS
  Administered 2017-05-26: 40 mg via INTRAVENOUS

## 2017-05-26 MED ORDER — LISINOPRIL 20 MG PO TABS
20.0000 mg | ORAL_TABLET | Freq: Every day | ORAL | Status: DC
  Administered 2017-05-27 – 2017-05-29 (×3): 20 mg via ORAL
  Filled 2017-05-26 (×3): qty 1

## 2017-05-26 MED ORDER — HYDROMORPHONE HCL 1 MG/ML IJ SOLN: 1.0000 mg | INTRAMUSCULAR | Status: DC | PRN

## 2017-05-26 MED ORDER — ACETAMINOPHEN 650 MG RE SUPP: 650.0000 mg | RECTAL | Status: DC | PRN

## 2017-05-26 MED ORDER — MIDAZOLAM HCL 2 MG/2ML IJ SOLN
INTRAMUSCULAR | Status: AC
  Filled 2017-05-26: qty 2

## 2017-05-26 MED ORDER — HYDROMORPHONE HCL 1 MG/ML IJ SOLN: 0.2500 mg | INTRAMUSCULAR | Status: DC | PRN

## 2017-05-26 SURGICAL SUPPLY — 62 items
AGENT HMST SPONGE THK3/8 (HEMOSTASIS)
BAG SPEC THK2 15X12 ZIP CLS (MISCELLANEOUS) ×1
BAG ZIPLOCK 12X15 (MISCELLANEOUS) ×1 IMPLANT
BANDAGE ACE 4X5 VEL STRL LF (GAUZE/BANDAGES/DRESSINGS) ×2 IMPLANT
BANDAGE ACE 6X5 VEL STRL LF (GAUZE/BANDAGES/DRESSINGS) ×2 IMPLANT
BLADE SAG 18X100X1.27 (BLADE) ×2 IMPLANT
BLADE SAW SGTL 11.0X1.19X90.0M (BLADE) ×2 IMPLANT
BLADE SAW SGTL 13.0X1.19X90.0M (BLADE) ×2 IMPLANT
CAPT KNEE TOTAL 3 ATTUNE ×1 IMPLANT
CEMENT HV SMART SET (Cement) ×4 IMPLANT
CLOTH 2% CHLOROHEXIDINE 3PK (PERSONAL CARE ITEMS) ×2 IMPLANT
COVER SURGICAL LIGHT HANDLE (MISCELLANEOUS) ×2 IMPLANT
CUFF TOURN SGL QUICK 34 (TOURNIQUET CUFF) ×2
CUFF TRNQT CYL 34X4X40X1 (TOURNIQUET CUFF) ×1 IMPLANT
DECANTER SPIKE VIAL GLASS SM (MISCELLANEOUS) ×2 IMPLANT
DRAPE INCISE IOBAN 66X45 STRL (DRAPES) IMPLANT
DRAPE ORTHO SPLIT 77X108 STRL (DRAPES) ×4
DRAPE SHEET LG 3/4 BI-LAMINATE (DRAPES) ×2 IMPLANT
DRAPE SURG ORHT 6 SPLT 77X108 (DRAPES) ×2 IMPLANT
DRAPE U-SHAPE 47X51 STRL (DRAPES) ×2 IMPLANT
DRSG AQUACEL AG ADV 3.5X10 (GAUZE/BANDAGES/DRESSINGS) ×2 IMPLANT
DRSG TEGADERM 4X4.75 (GAUZE/BANDAGES/DRESSINGS) IMPLANT
DURAPREP 26ML APPLICATOR (WOUND CARE) ×2 IMPLANT
ELECT REM PT RETURN 15FT ADLT (MISCELLANEOUS) ×2 IMPLANT
EVACUATOR 1/8 PVC DRAIN (DRAIN) IMPLANT
GAUZE SPONGE 2X2 8PLY STRL LF (GAUZE/BANDAGES/DRESSINGS) IMPLANT
GLOVE BIOGEL PI IND STRL 7.0 (GLOVE) ×1 IMPLANT
GLOVE BIOGEL PI IND STRL 8 (GLOVE) ×1 IMPLANT
GLOVE BIOGEL PI INDICATOR 7.0 (GLOVE) ×1
GLOVE BIOGEL PI INDICATOR 8 (GLOVE) ×1
GLOVE SURG SS PI 7.0 STRL IVOR (GLOVE) ×1 IMPLANT
GLOVE SURG SS PI 7.5 STRL IVOR (GLOVE) ×2 IMPLANT
GLOVE SURG SS PI 8.0 STRL IVOR (GLOVE) ×4 IMPLANT
GOWN STRL REUS W/TWL XL LVL3 (GOWN DISPOSABLE) ×4 IMPLANT
HANDPIECE INTERPULSE COAX TIP (DISPOSABLE) ×2
HEMOSTAT SPONGE AVITENE ULTRA (HEMOSTASIS) IMPLANT
IMMOBILIZER KNEE 20 (SOFTGOODS) ×1 IMPLANT
IMMOBILIZER KNEE 20 THIGH 36 (SOFTGOODS) ×1 IMPLANT
MANIFOLD NEPTUNE II (INSTRUMENTS) ×2 IMPLANT
NS IRRIG 1000ML POUR BTL (IV SOLUTION) IMPLANT
PACK TOTAL KNEE CUSTOM (KITS) ×2 IMPLANT
POSITIONER SURGICAL ARM (MISCELLANEOUS) ×2 IMPLANT
SET HNDPC FAN SPRY TIP SCT (DISPOSABLE) ×1 IMPLANT
SPONGE GAUZE 2X2 STER 10/PKG (GAUZE/BANDAGES/DRESSINGS)
SPONGE SURGIFOAM ABS GEL 100 (HEMOSTASIS) IMPLANT
STAPLER VISISTAT (STAPLE) ×1 IMPLANT
STRIP CLOSURE SKIN 1/2X4 (GAUZE/BANDAGES/DRESSINGS) IMPLANT
SUT BONE WAX W31G (SUTURE) IMPLANT
SUT MNCRL AB 4-0 PS2 18 (SUTURE) ×2 IMPLANT
SUT STRATAFIX 0 PDS 27 VIOLET (SUTURE) ×2
SUT VIC AB 1 CT1 27 (SUTURE) ×4
SUT VIC AB 1 CT1 27XBRD ANTBC (SUTURE) ×2 IMPLANT
SUT VIC AB 2-0 CT1 27 (SUTURE) ×6
SUT VIC AB 2-0 CT1 TAPERPNT 27 (SUTURE) ×3 IMPLANT
SUTURE STRATFX 0 PDS 27 VIOLET (SUTURE) ×1 IMPLANT
SYR 50ML LL SCALE MARK (SYRINGE) IMPLANT
TOWER CARTRIDGE SMART MIX (DISPOSABLE) ×2 IMPLANT
TRAY FOLEY W/METER SILVER 16FR (SET/KITS/TRAYS/PACK) ×2 IMPLANT
WATER STERILE IRR 1000ML POUR (IV SOLUTION) ×4 IMPLANT
WATER STERILE IRR 1500ML POUR (IV SOLUTION) ×1 IMPLANT
WRAP KNEE MAXI GEL POST OP (GAUZE/BANDAGES/DRESSINGS) ×2 IMPLANT
YANKAUER SUCT BULB TIP 10FT TU (MISCELLANEOUS) ×2 IMPLANT

## 2017-05-26 NOTE — Interval H&P Note (Signed)
History and Physical Interval Note:  05/26/2017 7:17 AM  Bryan Carroll  has presented today for surgery, with the diagnosis of Degenerative joint disease right knee  The various methods of treatment have been discussed with the patient and family. After consideration of risks, benefits and other options for treatment, the patient has consented to  Procedure(s) with comments: RIGHT TOTAL KNEE ARTHROPLASTY (Right) - 120 mins as a surgical intervention .  [name] history has been reviewed, patient examined, no change in status, stable for surgery.  I have reviewed the patient's chart and labs.  Questions were answered to the patient's satisfaction.     Edmund Rick C   

## 2017-05-26 NOTE — Anesthesia Procedure Notes (Signed)
Spinal  Patient location during procedure: OR Start time: 05/26/2017 7:32 AM Staffing Anesthesiologist: Mal AmabileFOSTER, Jazmen Lindenbaum Performed: anesthesiologist  Preanesthetic Checklist Completed: patient identified, site marked, surgical consent, pre-op evaluation, timeout performed, IV checked, risks and benefits discussed and monitors and equipment checked Spinal Block Patient position: sitting Prep: site prepped and draped and DuraPrep Patient monitoring: heart rate, cardiac monitor, continuous pulse ox and blood pressure Approach: midline Location: L3-4 Injection technique: single-shot Needle Needle type: Pencan  Needle gauge: 24 G Needle length: 9 cm Needle insertion depth: 5 cm Assessment Sensory level: T4 Additional Notes Attempt x 2. Poor position. Patient tolerated procedure well. Adequate sensory level.

## 2017-05-26 NOTE — H&P (View-Only) (Signed)
Bryan Carroll DOB: 06/06/1951 Married / Language: English / Race: White Male  H&P Date: 05/18/17  Chief Complaint: Right knee pain  History of Present Illness The patient is a 66 year old male who comes in today for a preoperative History and Physical. The patient is scheduled for a right total knee arthroplasty to be performed by Dr. Javier Docker, MD at [facility] on 05/26/2017. Bryan Carroll reports chronic progressively worsening R knee pain refractory to injection therapy, bracing, activity modifications, quad strengthening, medications, and relative rest. Pain is limiting ADLs and quality of life at this point and he desires to proceed with surgery.  Dr. Shelle Iron and the patient mutually agreed to proceed with a total knee replacement. Risks and benefits of the procedure were discussed including stiffness, suboptimal range of motion, persistent pain, infection requiring removal of prosthesis and reinsertion, need for prophylactic antibiotics in the future, for example, dental procedures, possible need for manipulation, revision in the future and also anesthetic complications including DVT, PE, etc. We discussed the perioperative course, time in the hospital, postoperative recovery and the need for elevation to control swelling. We also discussed the predicted range of motion and the probability that squatting and kneeling would be unobtainable in the future. In addition, postoperative anticoagulation was discussed. We have obtained preoperative medical clearance as necessary. Provided illustrated handout and discussed it in detail. They will enroll in the total joint replacement educational forum at the hospital.  [facility] pre-op appt is Friday 10/19.  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 Post-Surgical Plans  Home with HHPT. wife as caregiver  Medication History Fish Oil (1000MG  Capsule, Oral) Active. Lisinopril (20MG  Tablet, Oral) Active. Aspirin (81MG  Tablet, Oral) Active.  Past Surgical History Hernia Repair   Physical Exam General Mental Status -Alert, cooperative and good historian. General Appearance-pleasant, Not in acute distress. Orientation-Oriented X3. Build & Nutrition-Well nourished and Well developed.  Head and Neck Head-normocephalic, atraumatic . Neck Global Assessment - supple, no bruit auscultated on the right, no bruit auscultated on the left.  Eye Pupil - Bilateral-Regular and Round. Motion - Bilateral-EOMI.  Chest and Lung Exam Auscultation Breath sounds - clear at anterior chest wall and clear at posterior chest wall. Adventitious sounds - No Adventitious sounds.  Cardiovascular Auscultation Rhythm - Regular rate and rhythm. Heart Sounds - S1 WNL and S2 WNL. Murmurs & Other Heart Sounds - Auscultation of the heart reveals - No Murmurs.  Abdomen Palpation/Percussion Tenderness - Abdomen is non-tender to palpation. Rigidity (guarding) - Abdomen is soft. Auscultation Auscultation of the abdomen reveals - Bowel sounds normal.  Male Genitourinary Not done, not pertinent to present illness   Musculoskeletal On exam, he walks with a slightly antalgic gait. Mood and affect is appropriate. Slight varus thrust. Tender in the medial joint line. Patellofemoral pain with compression. Trace effusion. Patellofemoral crepitus.  Knee exam on  inspection reveals no evidence of soft tissue swelling, ecchymosis, deformity or erythema.  On palpation there is no tenderness in the lateral joint line. Nontender over the fibular head or the peroneal nerve. Nontender over the quadriceps insertion of the patellar ligament insertion. The range of motion was full. Provocative maneuvers revealed a negative Lachman, negative anterior and posterior drawer and a negative McMurray. No instability was noted with varus and valgus stressing at 0 or 30 degrees. On manual motor test the quadriceps and hamstrings were 5/5. Sensory exam was intact to light touch.  Imaging X-rays demonstrate end-stage osteoarthrosis, medial compartment, particularly of the knee. Slight varus deformity.  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 Shelle IronBeane on 05/26/17. We again discussed the procedure itself as well as risks, complications and alternatives, including but not limited to DVT, PE, infx, bleeding, failure of procedure, need for secondary procedure including manipulation, nerve injury, ongoing pain/symptoms, anesthesia risk, even stroke or death. Also discussed typical post-op protocols, activity restrictions, need for PT, flexion/extension exercises, time out of work. Discussed need for DVT ppx post-op per protocol. Discussed dental ppx and infx prevention. Also discussed limitations post-operatively such as kneeling and squatting. All questions were answered. Patient desires to proceed with surgery as scheduled. Will hold supplements, ASA and NSAIDs accordingly. Will remain NPO after MN night before surgery. [facility]. Anticipate hospital stay to include at least 2 midnights given medical history and to ensure proper pain control. Plan ASA 325mg  BID for DVT ppx post-op. Plan pain medication,  Robaxin, Colace, Miralax. Plan home with HHPT post-op with family members at home for assistance. Will follow up 10-14 days post-op for staple removal and xrays.  Plan right total knee replacement  Signed electronically by Dorothy SparkJaclyn M Jakorian Marengo, PA-C for Dr. Shelle IronBeane

## 2017-05-26 NOTE — Brief Op Note (Signed)
05/26/2017  9:46 AM  PATIENT:  Bryan Carroll  66 y.o. male  PRE-OPERATIVE DIAGNOSIS:  Degenerative joint disease right knee  POST-OPERATIVE DIAGNOSIS:  Degenerative joint disease right knee  PROCEDURE:  Procedure(s) with comments: RIGHT TOTAL KNEE ARTHROPLASTY (Right) - 120 mins  SURGEON:  Surgeon(s) and Role:    Jene Every* Kyndra Condron, MD - Primary  PHYSICIAN ASSISTANT:   ASSISTANTS: Bissell   ANESTHESIA:   spinal  EBL:  75 mL   BLOOD ADMINISTERED:none  DRAINS: none   LOCAL MEDICATIONS USED:  MARCAINE     SPECIMEN:  No Specimen  DISPOSITION OF SPECIMEN:  PATHOLOGY  COUNTS:  YES  TOURNIQUET:   Total Tourniquet Time Documented: Thigh (Right) - 60 minutes Total: Thigh (Right) - 60 minutes   DICTATION: .Other Dictation: Dictation Number [id number]  PLAN OF CARE: Admit to inpatient   PATIENT DISPOSITION:  PACU - hemodynamically stable.   Delay start of Pharmacological VTE agent (>24hrs) due to surgical blood loss or risk of bleeding: no

## 2017-05-26 NOTE — Progress Notes (Signed)
CSW following to assist with d/c if rehab at facility is recommended later. Per surgeon note, the plan is  home with HHPT post-op with family members at home for assistance.   Vivi BarrackNicole Rishon Thilges, Theresia MajorsLCSWA, MSW Clinical Social Worker  [phone] 05/26/2017  12:22 PM

## 2017-05-26 NOTE — Anesthesia Procedure Notes (Signed)
Anesthesia Regional Block: Adductor canal block   Pre-Anesthetic Checklist: ,, timeout performed, Correct Patient, Correct Site, Correct Laterality, Correct Procedure, Correct Position, site marked, Risks and benefits discussed,  Surgical consent,  Pre-op evaluation,  At surgeon's request and post-op pain management  Laterality: Right  Prep: chloraprep       Needles:  Injection technique: Single-shot  Needle Type: Echogenic Stimulator Needle     Needle Length: 9cm  Needle Gauge: 21   Needle insertion depth: 5 cm   Additional Needles:   Procedures:,,,, ultrasound used (permanent image in chart),,,,  Narrative:  Start time: 05/26/2017 7:18 AM End time: 05/26/2017 7:22 AM Injection made incrementally with aspirations every 5 mL.  Performed by: Personally  Anesthesiologist: Mal AmabileFOSTER, Jimy Gates  Additional Notes: Timeout performed. Patient sedated. Relevant anatomy ID'd using US. Incremental 2-355ml injection of LA with frequent aspiration. Patient tolerated procedure well.

## 2017-05-26 NOTE — Interval H&P Note (Signed)
History and Physical Interval Note:  05/26/2017 7:17 AM  Bryan Carroll  has presented today for surgery, with the diagnosis of Degenerative joint disease right knee  The various methods of treatment have been discussed with the patient and family. After consideration of risks, benefits and other options for treatment, the patient has consented to  Procedure(s) with comments: RIGHT TOTAL KNEE ARTHROPLASTY (Right) - 120 mins as a surgical intervention .  [name] history has been reviewed, patient examined, no change in status, stable for surgery.  I have reviewed the patient's chart and labs.  Questions were answered to the patient's satisfaction.     [name] C

## 2017-05-26 NOTE — Discharge Instructions (Signed)

## 2017-05-26 NOTE — Transfer of Care (Signed)
Immediate Anesthesia Transfer of Care Note  Patient: Makani Abu-Hashem  Procedure(s) Performed: RIGHT TOTAL KNEE ARTHROPLASTY (Right Knee)  Patient Location: PACU  Anesthesia Type:Spinal  Level of Consciousness: awake and alert   Airway & Oxygen Therapy: Patient Spontanous Breathing and Patient connected to nasal cannula oxygen  Post-op Assessment: Report given to RN and Post -op Vital signs reviewed and stable  Post vital signs: Reviewed and stable  Last Vitals:  Vitals:   05/26/17 0722 05/26/17 0723  BP:  (!) 141/83  Pulse: 68   Resp: 15   Temp:    SpO2: 99%     Last Pain:  Vitals:   05/26/17 0603  TempSrc: Oral      Patients Stated Pain Goal: 4 (05/26/17 0557)  Complications: No apparent anesthesia complications

## 2017-05-26 NOTE — Evaluation (Signed)
Physical Therapy Evaluation Patient Details Name: Bryan Carroll MRN: [id number] DOB: [birth year] Today's Date: 05/26/2017   History of Present Illness  Pt s/p R TKR  Clinical Impression  Pt s/p R TKR and presents with decreased R LE strength/ROM and post op pain limiting functional mobility.  Pt should progress to dc home with family assist.    Follow Up Recommendations DC plan and follow up therapy as arranged by surgeon;Home health PT    Equipment Recommendations  Rolling walker with 5" wheels    Recommendations for Other Services OT consult     Precautions / Restrictions Precautions Precautions: Knee;Fall Required Braces or Orthoses: Knee Immobilizer - Right Knee Immobilizer - Right: Discontinue once straight leg raise with < 10 degree lag Restrictions Weight Bearing Restrictions: No Other Position/Activity Restrictions: WBAT      Mobility  Bed Mobility Overal bed mobility: Needs Assistance Bed Mobility: Supine to Sit     Supine to sit: Min assist     General bed mobility comments: cues for sequence and use of L LE to self assist  Transfers Overall transfer level: Needs assistance Equipment used: Rolling walker (2 wheeled) Transfers: Sit to/from Stand Sit to Stand: Min assist         General transfer comment: cues for LE management and use of UEs to self assist  Ambulation/Gait Ambulation/Gait assistance: Min assist Ambulation Distance (Feet): 50 Feet Assistive device: Rolling walker (2 wheeled) Gait Pattern/deviations: Step-to pattern;Shuffle;Trunk flexed;Decreased step length - right;Decreased step length - left Gait velocity: decr Gait velocity interpretation: Below normal speed for age/gender General Gait Details: cues for sequence, posture and position from [employer]            Wheelchair Mobility    Modified Rankin (Stroke Patients Only)       Balance                                             Pertinent  Vitals/Pain Pain Assessment: Faces Faces Pain Scale: Hurts little more Pain Location: R hip Pain Descriptors / Indicators: Sore;Aching Pain Intervention(s): Limited activity within patient's tolerance;Monitored during session;Ice applied    Home Living Family/patient expects to be discharged to:: Private residence Living Arrangements: Spouse/significant other Available Help at Discharge: Family Type of Home: House Home Access: Stairs to enter Entrance Stairs-Rails: None Secretary/administrator of Steps: 3 Home Layout: One level Home Equipment: Cane - single point      Prior Function Level of Independence: Independent               Hand Dominance        Extremity/Trunk Assessment   Upper Extremity Assessment Upper Extremity Assessment: Overall WFL for tasks assessed    Lower Extremity Assessment Lower Extremity Assessment: RLE deficits/detail    Cervical / Trunk Assessment Cervical / Trunk Assessment: Kyphotic  Communication   Communication: No difficulties  Cognition Arousal/Alertness: Awake/alert Behavior During Therapy: WFL for tasks assessed/performed Overall Cognitive Status: Within Functional Limits for tasks assessed                                        General Comments      Exercises Total Joint Exercises Ankle Circles/Pumps: AROM;Both;20 reps;Supine   Assessment/Plan    PT Assessment Patient needs continued PT services  PT Problem List Decreased strength;Decreased range of motion;Decreased activity tolerance;Decreased mobility;Decreased knowledge of use of DME;Pain;Obesity       PT Treatment Interventions DME instruction;Gait training;Stair training;Functional mobility training;Therapeutic activities;Therapeutic exercise;Patient/family education    PT Goals (Current goals can be found in the Care Plan section)  Acute Rehab PT Goals Patient Stated Goal: Regain IND PT Goal Formulation: With patient Time For Goal  Achievement: 05/31/17 Potential to Achieve Goals: Good    Frequency 7X/week   Barriers to discharge        Co-evaluation               AM-PAC PT "6 Clicks" Daily Activity  Outcome Measure Difficulty turning over in bed (including adjusting bedclothes, sheets and blankets)?: Unable Difficulty moving from lying on back to sitting on the side of the bed? : Unable Difficulty sitting down on and standing up from a chair with arms (e.g., wheelchair, bedside commode, etc,.)?: Unable Help needed moving to and from a bed to chair (including a wheelchair)?: A Little Help needed walking in hospital room?: A Little Help needed climbing 3-5 steps with a railing? : A Little 6 Click Score: 12    End of Session Equipment Utilized During Treatment: Gait belt;Right knee immobilizer Activity Tolerance: Patient tolerated treatment well Patient left: in chair;with call bell/phone within reach;with family/visitor present Nurse Communication: Mobility status PT Visit Diagnosis: Difficulty in walking, not elsewhere classified (R26.2)    Time: [id number] PT Time Calculation (min) (ACUTE ONLY): 33 min   Charges:   PT Evaluation $PT Eval Low Complexity: 1 Low PT Treatments $Gait Training: 8-22 mins   PT G Codes:        Pg [phone]   Aiza Vollrath 05/26/2017, 5:49 PM

## 2017-05-26 NOTE — Anesthesia Procedure Notes (Signed)
Date/Time: 05/26/2017 6:50 AM Performed by: Minerva EndsMIRARCHI, Whittaker Lenis M Pre-anesthesia Checklist: Patient identified, Emergency Drugs available, Suction available, Patient being monitored and Timeout performed Oxygen Delivery Method: Nasal cannula Placement Confirmation: positive ETCO2 and breath sounds checked- equal and bilateral Comments: Twisp O2 for sedation/block

## 2017-05-26 NOTE — Progress Notes (Signed)
   05/26/17 1700  PT Time Calculation  PT Start Time (ACUTE ONLY) 1552  PT Stop Time (ACUTE ONLY) 1625  PT Time Calculation (min) (ACUTE ONLY) 33 min  PT G-Codes **NOT FOR INPATIENT CLASS**  Functional Assessment Tool Used Clinical judgement  Functional Limitation Mobility: Walking and moving around  Mobility: Walking and Moving Around Current Status (Z6109(G8978) CJ  Mobility: Walking and Moving Around Goal Status (U0454(G8979) CI  PT General Charges  $$ ACUTE PT VISIT 1 Visit  PT Evaluation  $PT Eval Low Complexity 1 Low  PT Treatments  $Gait Training 8-22 mins

## 2017-05-26 NOTE — Anesthesia Postprocedure Evaluation (Signed)
Anesthesia Post Note  Patient: Bryan Carroll  Procedure(s) Performed: RIGHT TOTAL KNEE ARTHROPLASTY (Right Knee)     Patient location during evaluation: PACU Anesthesia Type: Spinal Level of consciousness: oriented and awake and alert Pain management: pain level controlled Vital Signs Assessment: post-procedure vital signs reviewed and stable Respiratory status: spontaneous breathing, respiratory function stable and nonlabored ventilation Cardiovascular status: blood pressure returned to baseline and stable Postop Assessment: no headache, no backache, no apparent nausea or vomiting, patient able to bend at knees and spinal receding Anesthetic complications: no    Last Vitals:  Vitals:   05/26/17 1030 05/26/17 1045  BP: 136/80 134/79  Pulse: 61 (!) 53  Resp: 13 11  Temp: 36.9 C 36.9 C  SpO2: 98% 97%    Last Pain:  Vitals:   05/26/17 1045  TempSrc:   PainSc: (P) 0-No pain    LLE Motor Response: (P) Purposeful movement (05/26/17 1045) LLE Sensation: (P) Decreased (05/26/17 1045) RLE Motor Response: (P) Purposeful movement (05/26/17 1045) RLE Sensation: (P) Decreased (05/26/17 1045) L Sensory Level: (P) L2-Upper inner thigh, upper buttock (05/26/17 1045) R Sensory Level: (P) L2-Upper inner thigh, upper buttock (05/26/17 1045)  Jessia Kief A.

## 2017-05-27 DIAGNOSIS — I10 Essential (primary) hypertension: Secondary | ICD-10-CM | POA: Diagnosis present

## 2017-05-27 DIAGNOSIS — E669 Obesity, unspecified: Secondary | ICD-10-CM | POA: Diagnosis present

## 2017-05-27 DIAGNOSIS — M21161 Varus deformity, not elsewhere classified, right knee: Secondary | ICD-10-CM | POA: Diagnosis present

## 2017-05-27 DIAGNOSIS — M25561 Pain in right knee: Secondary | ICD-10-CM | POA: Diagnosis present

## 2017-05-27 DIAGNOSIS — Z8249 Family history of ischemic heart disease and other diseases of the circulatory system: Secondary | ICD-10-CM | POA: Diagnosis not present

## 2017-05-27 DIAGNOSIS — K449 Diaphragmatic hernia without obstruction or gangrene: Secondary | ICD-10-CM | POA: Diagnosis present

## 2017-05-27 DIAGNOSIS — E7800 Pure hypercholesterolemia, unspecified: Secondary | ICD-10-CM | POA: Diagnosis present

## 2017-05-27 DIAGNOSIS — K219 Gastro-esophageal reflux disease without esophagitis: Secondary | ICD-10-CM | POA: Diagnosis present

## 2017-05-27 DIAGNOSIS — M1711 Unilateral primary osteoarthritis, right knee: Secondary | ICD-10-CM | POA: Diagnosis not present

## 2017-05-27 DIAGNOSIS — Z6832 Body mass index (BMI) 32.0-32.9, adult: Secondary | ICD-10-CM | POA: Diagnosis not present

## 2017-05-27 LAB — BASIC METABOLIC PANEL
Anion gap: 8 (ref 5–15)
BUN: 8 mg/dL (ref 6–20)
CALCIUM: 8.6 mg/dL — AB (ref 8.9–10.3)
CHLORIDE: 106 mmol/L (ref 101–111)
CO2: 26 mmol/L (ref 22–32)
CREATININE: 0.74 mg/dL (ref 0.61–1.24)
Glucose, Bld: 121 mg/dL — ABNORMAL HIGH (ref 65–99)
Potassium: 3.9 mmol/L (ref 3.5–5.1)
SODIUM: 140 mmol/L (ref 135–145)

## 2017-05-27 LAB — CBC
HCT: 40.3 % (ref 39.0–52.0)
HEMOGLOBIN: 13.3 g/dL (ref 13.0–17.0)
MCH: 29.3 pg (ref 26.0–34.0)
MCHC: 33 g/dL (ref 30.0–36.0)
MCV: 88.8 fL (ref 78.0–100.0)
PLATELETS: 170 10*3/uL (ref 150–400)
RBC: 4.54 MIL/uL (ref 4.22–5.81)
RDW: 13.1 % (ref 11.5–15.5)
WBC: 12.8 10*3/uL — ABNORMAL HIGH (ref 4.0–10.5)

## 2017-05-27 MED ORDER — IBUPROFEN 200 MG PO TABS: 400.0000 mg | ORAL_TABLET | Freq: Four times a day (QID) | ORAL | 0 refills | Status: DC | PRN

## 2017-05-27 NOTE — Evaluation (Signed)
Occupational Therapy Evaluation Patient Details Name: Bryan Carroll MRN: [id number] DOB: Apr 07, 1951 Today's Date: 05/27/2017    History of Present Illness Pt s/p R TKR   Clinical Impression   Pt is s/p TKA resulting in the deficits listed below (see OT Problem List).  Pt will benefit from skilled OT to increase their safety and independence with ADL and functional mobility for ADL to facilitate discharge to venue listed below.        Follow Up Recommendations  No OT follow up    Equipment Recommendations  None recommended by OT    Recommendations for Other Services       Precautions / Restrictions Precautions Precautions: Knee;Fall Required Braces or Orthoses: Knee Immobilizer - Right Knee Immobilizer - Right: Discontinue once straight leg raise with < 10 degree lag Restrictions Weight Bearing Restrictions: No Other Position/Activity Restrictions: WBAT      Mobility Bed Mobility Overal bed mobility: Needs Assistance Bed Mobility: Sit to Supine       Sit to supine: Min guard   General bed mobility comments: cues for sequence and use of L LE to self assist  Transfers Overall transfer level: Needs assistance Equipment used: Rolling walker (2 wheeled) Transfers: Sit to/from Stand Sit to Stand: Min assist         General transfer comment: cues for LE management and use of UEs to self assist        ADL either performed or assessed with clinical judgement   ADL Overall ADL's : Needs assistance/impaired Eating/Feeding: Set up;Sitting   Grooming: Set up;Cueing for safety;Cueing for sequencing;Sitting   Upper Body Bathing: Set up;Sitting   Lower Body Bathing: Moderate assistance;Sit to/from stand;Cueing for safety;Cueing for sequencing   Upper Body Dressing : Set up;Sitting   Lower Body Dressing: Moderate assistance;Sit to/from stand;Cueing for safety;Cueing for sequencing       Toileting- Clothing Manipulation and Hygiene: Minimal assistance;Sit  to/from stand;Cueing for sequencing;Cueing for safety;Cueing for compensatory techniques     Tub/Shower Transfer Details (indicate cue type and reason): verbalized - will practice next day   General ADL Comments: pts wife not present.  will practice with her next day.      Vision Patient Visual Report: No change from baseline              Pertinent Vitals/Pain Pain Assessment: Faces Faces Pain Scale: Hurts even more Pain Location: R knee Pain Descriptors / Indicators: Sore;Aching Pain Intervention(s): Limited activity within patient's tolerance;Monitored during session;RN gave pain meds during session;Patient requesting pain meds-RN notified;Ice applied        Extremity/Trunk Assessment Upper Extremity Assessment Upper Extremity Assessment: Overall WFL for tasks assessed           Communication Communication Communication: No difficulties   Cognition Arousal/Alertness: Awake/alert Behavior During Therapy: WFL for tasks assessed/performed Overall Cognitive Status: Within Functional Limits for tasks assessed                                                Home Living Family/patient expects to be discharged to:: Private residence Living Arrangements: Spouse/significant other Available Help at Discharge: Family Type of Home: House Home Access: Stairs to enter Secretary/administratorntrance Stairs-Number of Steps: 3 Entrance Stairs-Rails: None Home Layout: One level               Home Equipment: Cane - single point  Prior Functioning/Environment Level of Independence: Independent                 OT Problem List: Decreased strength;Decreased activity tolerance;Decreased knowledge of use of DME or AE      OT Treatment/Interventions: Self-care/ADL training;DME and/or AE instruction;Patient/family education    OT Goals(Current goals can be found in the care plan section) Acute Rehab OT Goals Patient Stated Goal: Regain IND OT Goal Formulation:  With patient Time For Goal Achievement: 05/27/17  OT Frequency: Min 2X/week   Barriers to D/C:               AM-PAC PT "6 Clicks" Daily Activity     Outcome Measure Help from another person eating meals?: None Help from another person taking care of personal grooming?: None Help from another person toileting, which includes using toliet, bedpan, or urinal?: A Little Help from another person bathing (including washing, rinsing, drying)?: A Little Help from another person to put on and taking off regular upper body clothing?: None Help from another person to put on and taking off regular lower body clothing?: A Little 6 Click Score: 21   End of Session Equipment Utilized During Treatment: Rolling walker Nurse Communication: Mobility status  Activity Tolerance: Patient tolerated treatment well Patient left: in chair  OT Visit Diagnosis: Unsteadiness on feet (R26.81);Pain Pain - Right/Left: Right Pain - part of body: Knee                Time: 1010-1037 OT Time Calculation (min): 27 min Charges:  OT General Charges $OT Visit: 1 Visit OT Evaluation $OT Eval Low Complexity: 1 Low $OT Eval Moderate Complexity: 1 Mod OT Treatments $Self Care/Home Management : 8-22 mins G-Codes:     Lise Auer, OT [phone]  Einar Crow D 05/27/2017, 12:40 PM

## 2017-05-27 NOTE — Progress Notes (Signed)
Patient ID: Bryan Carroll, male   DOB: April 07, 1951, 66 y.o.   MRN: [id number] Subjective: 1 Day Post-Op Procedure(s) (LRB): RIGHT TOTAL KNEE ARTHROPLASTY (Right) Patient reports pain as mild.    Patient has complaints of R knee pain. No calf or thigh pain. No CP, SOB. No numbness or tingling. No N/V. No other c/o this AM.  We will start therapy today. Plan is to go home with HHPT after hospital stay.  Objective: Vital signs in last 24 hours: Temp:  [97.4 F (36.3 C)-98.4 F (36.9 C)] 98 F (36.7 C) (10/26 0502) Pulse Rate:  [53-76] 68 (10/26 0502) Resp:  [11-16] 15 (10/26 0502) BP: (105-142)/(63-89) 129/68 (10/26 0502) SpO2:  [96 %-99 %] 98 % (10/26 0502)  Intake/Output from previous day:  Intake/Output Summary (Last 24 hours) at 05/27/17 0843 Last data filed at 05/27/17 0502  Gross per 24 hour  Intake           2727.5 ml  Output             4225 ml  Net          -1497.5 ml    Intake/Output this shift: No intake/output data recorded.  Labs: Results for orders placed or performed during the hospital encounter of 05/26/17  CBC  Result Value Ref Range   WBC 12.8 (H) 4.0 - 10.5 K/uL   RBC 4.54 4.22 - 5.81 MIL/uL   Hemoglobin 13.3 13.0 - 17.0 g/dL   HCT 82.940.3 56.239.0 - 13.052.0 %   MCV 88.8 78.0 - 100.0 fL   MCH 29.3 26.0 - 34.0 pg   MCHC 33.0 30.0 - 36.0 g/dL   RDW 86.513.1 78.411.5 - 69.615.5 %   Platelets 170 150 - 400 K/uL  Basic metabolic panel  Result Value Ref Range   Sodium 140 135 - 145 mmol/L   Potassium 3.9 3.5 - 5.1 mmol/L   Chloride 106 101 - 111 mmol/L   CO2 26 22 - 32 mmol/L   Glucose, Bld 121 (H) 65 - 99 mg/dL   BUN 8 6 - 20 mg/dL   Creatinine, Ser 2.950.74 0.61 - 1.24 mg/dL   Calcium 8.6 (L) 8.9 - 10.3 mg/dL   GFR calc non Af Amer >60 >60 mL/min   GFR calc Af Amer >60 >60 mL/min   Anion gap 8 5 - 15    Exam - Neurologically intact ABD soft Neurovascular intact Sensation intact distally Intact pulses distally Dorsiflexion/Plantar flexion intact Incision:  dressing C/D/I and no drainage No cellulitis present Compartment soft no calf pain or sign of DVT Dressing - clean, dry, no drainage Motor function intact - moving foot and toes well on exam.   Assessment/Plan: 1 Day Post-Op Procedure(s) (LRB): RIGHT TOTAL KNEE ARTHROPLASTY (Right)  Advance diet Up with therapy D/C IV fluids Past Medical History:  Diagnosis Date  . Colon polyp   . GERD (gastroesophageal reflux disease)   . History of hiatal hernia    inguinal hernia right   . Hypertension   . Palpitations   . PVC (premature ventricular contraction)   . Trigeminy     DVT Prophylaxis - ASA 325mg  BID Protocol Weight-Bearing as tolerated to Right leg No vaccines Up with PT Plan home with HHPT when ready- anticipate tomorrow, otherwise Sunday, depending on pain control and progress with PT Will discuss with Dr. Elissa Carroll  Bryan Carroll, Bryan BarkerJACLYN M. 05/27/2017, 8:43 AM

## 2017-05-27 NOTE — Op Note (Signed)
NAMLangston Reusing:  ABU-HASHEM, Dshaun         ACCOUNT NO.:  [id number]  MEDICAL RECORD NO.:  [id number]  LOCATION:                                 FACILITY:  PHYSICIAN:  Jene EveryJeffrey Lockie Bothun, M.D.         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  PROCEDURE PERFORMED:  Right total knee arthroplasty utilizing Attune rotating platform, 8 femur, 9 tibia, 6 mm insert, 41 patella.  ANESTHESIA:  Spinal.  ASSISTANT:  Lanna PocheJacqueline Bissell, PA.  HISTORY:  A 66 year old with bone-on-bone, refractory conservative treatment right knee osteoarthritis indicated for replacement of degenerated joint with a significant negative affect of his activities of daily living.  Risks and benefits were discussed including bleeding, infection, damage to neurovascular structures, no change in symptoms, worsening symptoms, DVT, PE, anesthetic complications, etc.  TECHNIQUE:  With the patient in supine position after induction of adequate spinal with 2 g Kefzol, the right lower extremity was prepped, draped, and exsanguinated in usual sterile fashion.  Thigh tourniquet inflated to 250 mmHg.  A midline incision was made.  Full-thickness flaps developed.  Median parapatellar arthrotomy performed.  Patella everted and knee flexed.  Tricompartmental bone-on-bone arthrosis was noted particularly posterior medially.  Osteophytes were removed with a rongeur.  Elevated soft tissues medially protecting the MCL. Osteophytes were removed.  Remnants of the medial and lateral menisci were removed.  Geniculates cauterized.  Step drill was utilized into the femoral canal, was irrigated, 5-degree right was selected, 9 of the distal femur pinned, performed a distal femoral cut.  We  then sized the femur of the anterior cortex to an 8 just off the anterior cortex.  We then pinned in 3 degrees of external rotation, used our cutting block, protected the soft tissues posteriorly, performed anterior, posterior and chamfer cuts.  We then subluxed the tibia.  External alignment guide was used, two of the defects posterior medially which was 10 off the lateral side bisecting the tibiotalar joint parallel to the shaft, 3- degree slope.  We performed our cut and then placed drill holes posterior medially.  We sized the tibia to a 9 just to medial aspect of tibial tubercle, was pinned, drilled centrally after harvesting some bone for the femoral canal and used our punch guide with the trial of 9. We then turned our attention to the femur, performed our box cut bisecting the canal.  Placed a trial femur 8, drilling our lug holes and 7 insert was placed.  We reduced it, had slight flexion.  Attention was turned toward the patella, was measured at 25, plain, parallel to a 15, measured at 41, drilled our PEG holes medializing this as the patellar guide was trialed parallel to the joint.  We placed a trial patella, reduced it and had excellent patellofemoral tracking.  Good stability with varus and valgus stressing.  Negative anterior drawer.  We removed all components.  Checked posteriorly.  Small osteophytes removed with a rongeur.  Remnants of the menisci removed.  Popliteus was intact as was the capsule.  Copiously irrigated with pulsatile lavage.  Flexed the knee.  All surfaces were thoroughly dried.  Mixed cement on back table in appropriate fashion and injected into the tibia and digitally pressurizing cement on the tibial tray.  We impacted the tibial tray on the tibia, redundant cement removed.  We cemented and impacted the femur, redundant cement removed.  We placed a 6 insert, reduced it, held an axial load throughout the curing of the cement.  Redundant cement removed.   We cemented and clamped the patella.  0.25% Marcaine with epinephrine was infiltrated in the periosteum.  Antibiotic irrigation was placed in the wound while cement was curing.  After curing of the cement, the tourniquet was deflated at 58 minutes.  Any bleeding which was minor was cauterized.  I removed the insert, meticulously removed any redundant cement, subluxed the tibia, copiously irrigated with pulsatile lavage and antibiotic irrigation, placed a 6 permanent insert, reduced it and had excellent full extension, full flexion, good stability, varus and valgus stressing at 0 and 30 degrees.  Negative anterior drawer.  I then reapproximated the patellar arthrotomy with previous markings.  With #1 Vicryl interrupted figure-of-eight sutures that was oversewn with a running Stratafix.  Had flexion to gravity at 90 degrees and excellent patellofemoral tracking after this.  Copious irrigation of subcutaneous tissue, subcu was closed with 2-0, skin with staples.  Wound was dressed sterilely, had flexion to gravity at 90 degrees.  Placed in an immobilizer and then transported to the recovery room in satisfactory condition.  The patient tolerated the procedure well.  No complications.  Minimal blood loss.     Jene Every, M.D.   ______________________________ Jene Every, M.D.    Cordelia Pen  D:  05/26/2017  T:  05/26/2017  Job:  [id number]

## 2017-05-27 NOTE — Discharge Summary (Signed)
Physician Discharge Summary   Patient ID: Bryan Carroll MRN: [id number] DOB/AGE: 64-Nov-1952 64 y.o.  Admit date: 05/26/2017 Discharge date: 05/29/17  Primary Diagnosis: right knee primary osteoarthritis  Admission Diagnoses:  Past Medical History:  Diagnosis Date  . Colon polyp   . GERD (gastroesophageal reflux disease)   . History of hiatal hernia    inguinal hernia right   . Hypertension   . Palpitations   . PVC (premature ventricular contraction)   . Trigeminy    Discharge Diagnoses:   Principal Problem:   Primary osteoarthritis of right knee Active Problems:   Right knee DJD  Estimated body mass index is 32.49 kg/m as calculated from the following:   Height as of this encounter: _0  (1.753 m).   Weight as of this encounter: 99.8 kg (220 lb).  Procedure:  Procedure(s) (LRB): RIGHT TOTAL KNEE ARTHROPLASTY (Right)   Consults: None  HPI: see H&P Laboratory Data: Admission on 05/26/2017  Component Date Value Ref Range Status  . WBC 05/27/2017 12.8* 4.0 - 10.5 K/uL Final  . RBC 05/27/2017 4.54  4.22 - 5.81 MIL/uL Final  . Hemoglobin 05/27/2017 13.3  13.0 - 17.0 g/dL Final  . HCT 05/27/2017 40.3  39.0 - 52.0 % Final  . MCV 05/27/2017 88.8  78.0 - 100.0 fL Final  . MCH 05/27/2017 29.3  26.0 - 34.0 pg Final  . MCHC 05/27/2017 33.0  30.0 - 36.0 g/dL Final  . RDW 05/27/2017 13.1  11.5 - 15.5 % Final  . Platelets 05/27/2017 170  150 - 400 K/uL Final  . Sodium 05/27/2017 140  135 - 145 mmol/L Final  . Potassium 05/27/2017 3.9  3.5 - 5.1 mmol/L Final  . Chloride 05/27/2017 106  101 - 111 mmol/L Final  . CO2 05/27/2017 26  22 - 32 mmol/L Final  . Glucose, Bld 05/27/2017 121* 65 - 99 mg/dL Final  . BUN 05/27/2017 8  6 - 20 mg/dL Final  . Creatinine, Ser 05/27/2017 0.74  0.61 - 1.24 mg/dL Final  . Calcium 05/27/2017 8.6* 8.9 - 10.3 mg/dL Final  . GFR calc non Af Amer 05/27/2017 >60  >60 mL/min Final  . GFR calc Af Amer 05/27/2017 >60  >60 mL/min Final   Comment: (NOTE) The eGFR has been calculated using the CKD EPI equation. This calculation has not been validated in all clinical situations. eGFR's persistently <60 mL/min signify possible Chronic Kidney Disease.   [name] gap 05/27/2017 8  5 - 15 Final  Hospital Outpatient Visit on 05/20/2017  Component Date Value Ref Range Status  . MRSA, PCR 05/20/2017 NEGATIVE  NEGATIVE Final  . Staphylococcus aureus 05/20/2017 NEGATIVE  NEGATIVE Final   Comment: (NOTE) The Xpert SA Assay (FDA approved for NASAL specimens in patients 90 years of age and older), is one component of a comprehensive surveillance program. It is not intended to diagnose infection nor to guide or monitor treatment.   Marland Kitchen aPTT 05/20/2017 33  24 - 36 seconds Final  . Sodium 05/20/2017 139  135 - 145 mmol/L Final  . Potassium 05/20/2017 4.7  3.5 - 5.1 mmol/L Final  . Chloride 05/20/2017 104  101 - 111 mmol/L Final  . CO2 05/20/2017 28  22 - 32 mmol/L Final  . Glucose, Bld 05/20/2017 106* 65 - 99 mg/dL Final  . BUN 05/20/2017 14  6 - 20 mg/dL Final  . Creatinine, Ser 05/20/2017 0.87  0.61 - 1.24 mg/dL Final  . Calcium 05/20/2017 9.4  8.9 - 10.3 mg/dL Final  .  GFR calc non Af Amer 05/20/2017 >60  >60 mL/min Final  . GFR calc Af Amer 05/20/2017 >60  >60 mL/min Final   Comment: (NOTE) The eGFR has been calculated using the CKD EPI equation. This calculation has not been validated in all clinical situations. eGFR's persistently <60 mL/min signify possible Chronic Kidney Disease.   . Anion gap 05/20/2017 7  5 - 15 Final  . WBC 05/20/2017 6.7  4.0 - 10.5 K/uL Final  . RBC 05/20/2017 5.52  4.22 - 5.81 MIL/uL Final  . Hemoglobin 05/20/2017 16.5  13.0 - 17.0 g/dL Final  . HCT 17/47/1595 48.6  39.0 - 52.0 % Final  . MCV 05/20/2017 88.0  78.0 - 100.0 fL Final  . MCH 05/20/2017 29.9  26.0 - 34.0 pg Final  . MCHC 05/20/2017 34.0  30.0 - 36.0 g/dL Final  . RDW 39/67/2897 13.0  11.5 - 15.5 % Final  . Platelets 05/20/2017 163  150  - 400 K/uL Final  . Prothrombin Time 05/20/2017 12.1  11.4 - 15.2 seconds Final  . INR 05/20/2017 0.91   Final  . Color, Urine 05/20/2017 STRAW* YELLOW Final  . APPearance 05/20/2017 CLEAR  CLEAR Final  . Specific Gravity, Urine 05/20/2017 1.003* 1.005 - 1.030 Final  . pH 05/20/2017 6.0  5.0 - 8.0 Final  . Glucose, UA 05/20/2017 NEGATIVE  NEGATIVE mg/dL Final  . Hgb urine dipstick 05/20/2017 NEGATIVE  NEGATIVE Final  . Bilirubin Urine 05/20/2017 NEGATIVE  NEGATIVE Final  . Ketones, ur 05/20/2017 NEGATIVE  NEGATIVE mg/dL Final  . Protein, ur 91/50/4136 NEGATIVE  NEGATIVE mg/dL Final  . Nitrite 43/83/7793 NEGATIVE  NEGATIVE Final  . Leukocytes, UA 05/20/2017 NEGATIVE  NEGATIVE Final     X-Rays:Dg Knee 1-2 Views Right  Result Date: 05/26/2017 CLINICAL DATA:  Right total knee replacement. EXAM: RIGHT KNEE - 1-2 VIEW COMPARISON:  Right knee x-rays dated April 09, 2016. FINDINGS: Postsurgical changes related to interval right total knee arthroplasty. Components are well aligned. No acute fracture. Expected postsurgical changes including subcutaneous and intra-articular emphysema, as well as skin staples. Bone mineralization is normal. IMPRESSION: Interval right total knee arthroplasty without evidence of acute postoperative complication. Electronically Signed   By: Obie Dredge M.D.   On: 05/26/2017 10:26    EKG: Orders placed or performed in visit on 07/18/14  . Holter monitor - 24 hour     Hospital Course: Bryan Carroll is a 66 y.o. who was admitted to [facility]. They were brought to the operating room on 05/26/2017 and underwent Procedure(s): RIGHT TOTAL KNEE ARTHROPLASTY.  Patient tolerated the procedure well and was later transferred to the recovery room and then to the orthopaedic floor for postoperative care.  They were given PO and IV analgesics for pain control following their surgery.  They were given 24 hours of postoperative antibiotics of  Anti-infectives      Start     Dose/Rate Route Frequency Ordered Stop   05/26/17 1400  ceFAZolin (ANCEF) IVPB 2g/100 mL premix     2 g 200 mL/hr over 30 Minutes Intravenous Every 6 hours 05/26/17 1056 05/27/17 0200   05/26/17 0836  polymyxin B 500,000 Units, bacitracin 50,000 Units in sodium chloride 0.9 % 500 mL irrigation  Status:  Discontinued       As needed 05/26/17 0836 05/26/17 0940   05/26/17 0532  ceFAZolin (ANCEF) IVPB 2g/100 mL premix     2 g 200 mL/hr over 30 Minutes Intravenous On call to O.R. 05/26/17 0532 05/26/17  0808     and started on DVT prophylaxis in the form of Aspirin, TED hose and SCDs.   PT and OT were ordered for total joint protocol.  Discharge planning consulted to help with postop disposition and equipment needs.  Patient had a good night on the evening of surgery.  They started to get up OOB with therapy on day one. Continued to work with therapy into day two.  By day three, the patient had progressed with therapy and meeting their goals.  Incision was healing well.  Patient was seen in rounds and was ready to go home.   Diet: Regular diet Activity:WBAT Follow-up:in 10-14 days Disposition - Home with HHPT Discharged Condition: good    Allergies as of 05/27/2017   No Known Allergies     Medication List    STOP taking these medications   aspirin 81 MG tablet Replaced by:  aspirin EC 325 MG tablet   atorvastatin 10 MG tablet Commonly known as:  LIPITOR     TAKE these medications   aspirin EC 325 MG tablet Take 1 tablet (325 mg total) by mouth 2 (two) times daily. Replaces:  aspirin 81 MG tablet   docusate sodium 100 MG capsule Commonly known as:  COLACE Take 1 capsule (100 mg total) by mouth 2 (two) times daily as needed for mild constipation.   FISH OIL PO Take 1 capsule by mouth daily.   ibuprofen 200 MG tablet Commonly known as:  ADVIL,MOTRIN Take 2 tablets (400 mg total) by mouth every 6 (six) hours as needed for mild pain. May resume 1 week post-op as  needed What changed:  additional instructions   lisinopril 20 MG tablet Commonly known as:  PRINIVIL,ZESTRIL Take 20 mg by mouth daily.   oxyCODONE-acetaminophen 5-325 MG tablet Commonly known as:  PERCOCET Take 1-2 tablets by mouth every 4 (four) hours as needed for severe pain.   polyethylene glycol packet Commonly known as:  MIRALAX / GLYCOLAX Take 17 g by mouth daily.      Follow-up Information    Susa Day, MD Follow up in 2 week(s).   Specialty:  Orthopedic Surgery Contact information: [address] [place] [address] [phone]           Signed: Lacie Draft, PA-C Orthopaedic Surgery 05/27/2017, 8:47 AM

## 2017-05-27 NOTE — Progress Notes (Signed)
Physical Therapy Treatment Patient Details Name: Bryan Carroll MRN: [id number] DOB: 1951/01/03 Today's Date: 05/27/2017    History of Present Illness Pt s/p R TKR    PT Comments    Pt motivated and progressing well but with increased time required for all tasks.   Follow Up Recommendations  DC plan and follow up therapy as arranged by surgeon;Home health PT     Equipment Recommendations  Rolling walker with 5" wheels    Recommendations for Other Services OT consult     Precautions / Restrictions Precautions Precautions: Knee;Fall Required Braces or Orthoses: Knee Immobilizer - Right Knee Immobilizer - Right: Discontinue once straight leg raise with < 10 degree lag Restrictions Weight Bearing Restrictions: No Other Position/Activity Restrictions: WBAT    Mobility  Bed Mobility Overal bed mobility: Needs Assistance Bed Mobility: Sit to Supine       Sit to supine: Min guard   General bed mobility comments: cues for sequence and use of L LE to self assist  Transfers Overall transfer level: Needs assistance Equipment used: Rolling walker (2 wheeled) Transfers: Sit to/from Stand Sit to Stand: Min assist;Min guard         General transfer comment: cues for LE management and use of UEs to self assist  Ambulation/Gait Ambulation/Gait assistance: Min assist;Min guard Ambulation Distance (Feet): 90 Feet Assistive device: Rolling walker (2 wheeled) Gait Pattern/deviations: Step-to pattern;Shuffle;Trunk flexed;Decreased step length - right;Decreased step length - left Gait velocity: decr Gait velocity interpretation: Below normal speed for age/gender General Gait Details: cues for sequence, posture and position from [employer]   Stairs            Wheelchair Mobility    Modified Rankin (Stroke Patients Only)       Balance                                            Cognition Arousal/Alertness: Awake/alert Behavior During Therapy:  WFL for tasks assessed/performed Overall Cognitive Status: Within Functional Limits for tasks assessed                                        Exercises Total Joint Exercises Ankle Circles/Pumps: AROM;Both;20 reps;Supine Quad Sets: AROM;Both;10 reps;Supine Heel Slides: AAROM;Right;10 reps;Supine Straight Leg Raises: AAROM;Right;10 reps;Supine Goniometric ROM: AAROM R knee -10 - 40    General Comments        Pertinent Vitals/Pain Pain Assessment: Faces Faces Pain Scale: Hurts even more Pain Location: R hip Pain Descriptors / Indicators: Sore;Aching Pain Intervention(s): Limited activity within patient's tolerance;Monitored during session;Premedicated before session;Ice applied    Home Living                      Prior Function            PT Goals (current goals can now be found in the care plan section) Acute Rehab PT Goals Patient Stated Goal: Regain IND PT Goal Formulation: With patient Time For Goal Achievement: 05/31/17 Potential to Achieve Goals: Good Progress towards PT goals: Progressing toward goals    Frequency    7X/week      PT Plan Current plan remains appropriate    Co-evaluation              AM-PAC PT "6 Clicks" Daily  Activity  Outcome Measure  Difficulty turning over in bed (including adjusting bedclothes, sheets and blankets)?: Unable Difficulty moving from lying on back to sitting on the side of the bed? : Unable Difficulty sitting down on and standing up from a chair with arms (e.g., wheelchair, bedside commode, etc,.)?: Unable Help needed moving to and from a bed to chair (including a wheelchair)?: A Little Help needed walking in hospital room?: A Little Help needed climbing 3-5 steps with a railing? : A Little 6 Click Score: 12    End of Session Equipment Utilized During Treatment: Gait belt;Right knee immobilizer Activity Tolerance: Patient tolerated treatment well Patient left: in bed;with call  bell/phone within reach;with family/visitor present Nurse Communication: Mobility status PT Visit Diagnosis: Difficulty in walking, not elsewhere classified (R26.2)     Time: 1610-9604 PT Time Calculation (min) (ACUTE ONLY): 38 min  Charges:  $Gait Training: 8-22 mins $Therapeutic Exercise: 8-22 mins $Therapeutic Activity: 8-22 mins                    G Codes:       Pg [phone]    Jovanna Hodges 05/27/2017, 12:21 PM

## 2017-05-27 NOTE — Progress Notes (Signed)
Discharge planning, spoke with patient and spouse at bedside. Have chosen [facility] for HH PT. Contacted [facility] for referral. Needs RW and 3n1, contacted [facility] to deliver to room. [phone] 

## 2017-05-27 NOTE — Progress Notes (Signed)
Physical Therapy Treatment Patient Details Name: Bryan Carroll MRN: [id number] DOB: 12/09/1950 Today's Date: 05/27/2017    History of Present Illness Pt s/p R TKR    PT Comments    Pt cooperative but ltd this pm by dizziness with move to standing - BP 79/48 - RN aware.  Reviewed don/doff KI with pt and spouse prior to attempt at OOB activity.   Follow Up Recommendations  DC plan and follow up therapy as arranged by surgeon;Home health PT     Equipment Recommendations  Rolling walker with 5" wheels    Recommendations for Other Services OT consult     Precautions / Restrictions Precautions Precautions: Knee;Fall Required Braces or Orthoses: Knee Immobilizer - Right Knee Immobilizer - Right: Discontinue once straight leg raise with < 10 degree lag Restrictions Weight Bearing Restrictions: No Other Position/Activity Restrictions: WBAT    Mobility  Bed Mobility Overal bed mobility: Needs Assistance Bed Mobility: Supine to Sit;Sit to Supine     Supine to sit: Min guard Sit to supine: Min assist   General bed mobility comments: cues for sequence and use of L LE to self assist  Transfers Overall transfer level: Needs assistance Equipment used: Rolling walker (2 wheeled) Transfers: Sit to/from Stand Sit to Stand: Min assist         General transfer comment: cues for LE management and use of UEs to self assist  Ambulation/Gait             General Gait Details: Pt stood only but ambulation deferred 2* c/o increasing dizziness.  Pt returned to sitting and BP 79/48 - RN present   Information systems managertairs            Wheelchair Mobility    Modified Rankin (Stroke Patients Only)       Balance                                            Cognition Arousal/Alertness: Awake/alert Behavior During Therapy: WFL for tasks assessed/performed Overall Cognitive Status: Within Functional Limits for tasks assessed                                         Exercises      General Comments        Pertinent Vitals/Pain Pain Assessment: Faces Faces Pain Scale: Hurts even more Pain Location: R knee Pain Descriptors / Indicators: Sore;Aching;Grimacing;Guarding Pain Intervention(s): Limited activity within patient's tolerance;Monitored during session;Premedicated before session;Ice applied    Home Living                      Prior Function            PT Goals (current goals can now be found in the care plan section) Acute Rehab PT Goals Patient Stated Goal: Regain IND PT Goal Formulation: With patient Time For Goal Achievement: 05/31/17 Potential to Achieve Goals: Good Progress towards PT goals: Progressing toward goals    Frequency    7X/week      PT Plan Current plan remains appropriate    Co-evaluation              AM-PAC PT "6 Clicks" Daily Activity  Outcome Measure  Difficulty turning over in bed (including adjusting bedclothes, sheets and blankets)?: Unable Difficulty  moving from lying on back to sitting on the side of the bed? : A Lot Difficulty sitting down on and standing up from a chair with arms (e.g., wheelchair, bedside commode, etc,.)?: A Lot Help needed moving to and from a bed to chair (including a wheelchair)?: A Little Help needed walking in hospital room?: A Little Help needed climbing 3-5 steps with a railing? : A Lot 6 Click Score: 13    End of Session Equipment Utilized During Treatment: Gait belt;Right knee immobilizer Activity Tolerance: Patient tolerated treatment well Patient left: in bed;with call bell/phone within reach;with family/visitor present Nurse Communication: Mobility status PT Visit Diagnosis: Difficulty in walking, not elsewhere classified (R26.2)     Time: 4098-1191 PT Time Calculation (min) (ACUTE ONLY): 20 min  Charges:  $Therapeutic Activity: 8-22 mins                    G Codes:       Pg [phone]  [phone]    Chaeli Judy 05/27/2017, 5:44 PM

## 2017-05-27 NOTE — Progress Notes (Signed)
Pt up with PT to side of bed @ 1522, became clammy, c/o dizzy & light-headed. BP @ 1525 79/48 w/HR 59. Pt assisted to lie back down. BP @ 1532 150/54 w/HR 61. Notified Irish EldersJackie Bissell, [place]. Discussed possible causes & noted pt had 10 mg Oxycodone about one hour prior to this. She said to try 5 mg first. Betheny Suchecki, [employer] Bath [employer] Beyond[employer]

## 2017-05-28 LAB — CBC
HEMATOCRIT: 42 % (ref 39.0–52.0)
HEMOGLOBIN: 13.9 g/dL (ref 13.0–17.0)
MCH: 29.4 pg (ref 26.0–34.0)
MCHC: 33.1 g/dL (ref 30.0–36.0)
MCV: 88.8 fL (ref 78.0–100.0)
Platelets: 166 10*3/uL (ref 150–400)
RBC: 4.73 MIL/uL (ref 4.22–5.81)
RDW: 13.4 % (ref 11.5–15.5)
WBC: 13.7 10*3/uL — ABNORMAL HIGH (ref 4.0–10.5)

## 2017-05-28 MED ORDER — ZOLPIDEM TARTRATE 10 MG PO TABS: 10.0000 mg | ORAL_TABLET | Freq: Every evening | ORAL | Status: DC | PRN

## 2017-05-28 NOTE — Progress Notes (Signed)
Physical Therapy Treatment Patient Details Name: Bryan Carroll MRN: [id number] DOB: [birth year] Today's Date: 05/28/2017    History of Present Illness Pt s/p R TKR    PT Comments    Pt progressing slowly - requiring increased time with all tasks and with increased assist needed for therex program.  Follow Up Recommendations  DC plan and follow up therapy as arranged by surgeon;Home health PT     Equipment Recommendations  Rolling walker with 5" wheels    Recommendations for Other Services OT consult     Precautions / Restrictions Precautions Precautions: Knee;Fall Required Braces or Orthoses: Knee Immobilizer - Right Knee Immobilizer - Right: Discontinue once straight leg raise with < 10 degree lag Restrictions Weight Bearing Restrictions: No Other Position/Activity Restrictions: WBAT    Mobility  Bed Mobility                  Transfers                    Ambulation/Gait                 Stairs            Wheelchair Mobility    Modified Rankin (Stroke Patients Only)       Balance                                            Cognition Arousal/Alertness: Awake/alert Behavior During Therapy: WFL for tasks assessed/performed Overall Cognitive Status: Within Functional Limits for tasks assessed                                        Exercises Total Joint Exercises Ankle Circles/Pumps: AROM;Both;20 reps;Supine Quad Sets: AROM;Both;10 reps;Supine Heel Slides: AAROM;Right;10 reps;Supine Straight Leg Raises: AAROM;Right;10 reps;Supine Goniometric ROM: AAROM R knee -12 - 30    General Comments        Pertinent Vitals/Pain Pain Assessment: Faces Faces Pain Scale: Hurts whole lot (with therex) Pain Location: R knee Pain Descriptors / Indicators: Sore;Aching;Grimacing;Guarding Pain Intervention(s): Limited activity within patient's tolerance;Monitored during session;Premedicated before  session;Ice applied    Home Living                      Prior Function            PT Goals (current goals can now be found in the care plan section) Acute Rehab PT Goals Patient Stated Goal: Regain IND PT Goal Formulation: With patient Time For Goal Achievement: 05/31/17 Potential to Achieve Goals: Good Progress towards PT goals: Progressing toward goals    Frequency    7X/week      PT Plan Current plan remains appropriate    Co-evaluation              AM-PAC PT "6 Clicks" Daily Activity  Outcome Measure  Difficulty turning over in bed (including adjusting bedclothes, sheets and blankets)?: Unable Difficulty moving from lying on back to sitting on the side of the bed? : A Lot Difficulty sitting down on and standing up from a chair with arms (e.g., wheelchair, bedside commode, etc,.)?: A Lot Help needed moving to and from a bed to chair (including a wheelchair)?: A Little Help needed walking in hospital room?: A Little Help needed  climbing 3-5 steps with a railing? : A Lot 6 Click Score: 13    End of Session   Activity Tolerance: Patient tolerated treatment well Patient left: in bed;with call bell/phone within reach;with family/visitor present Nurse Communication: Mobility status PT Visit Diagnosis: Difficulty in walking, not elsewhere classified (R26.2)     Time: 1610-9604 PT Time Calculation (min) (ACUTE ONLY): 22 min  Charges:  $Therapeutic Exercise: 8-22 mins                    G Codes:       Pg [phone]    Bryan Carroll 05/28/2017, 12:37 PM

## 2017-05-28 NOTE — Progress Notes (Signed)
Physical Therapy Treatment Patient Details Name: Bryan Carroll MRN: [id number] DOB: 1951-03-08 Today's Date: 05/28/2017    History of Present Illness Pt s/p R TKR    PT Comments    Pt progressing slowly 2* c/o pain and fatigue.   Follow Up Recommendations  DC plan and follow up therapy as arranged by surgeon;Home health PT     Equipment Recommendations  Rolling walker with 5" wheels    Recommendations for Other Services OT consult     Precautions / Restrictions Precautions Precautions: Knee;Fall Required Braces or Orthoses: Knee Immobilizer - Right Knee Immobilizer - Right: Discontinue once straight leg raise with < 10 degree lag Restrictions Weight Bearing Restrictions: No Other Position/Activity Restrictions: WBAT    Mobility  Bed Mobility Overal bed mobility: Needs Assistance Bed Mobility: Supine to Sit     Supine to sit: Min assist     General bed mobility comments: needed A for RLE with KI on  Transfers Overall transfer level: Needs assistance Equipment used: Rolling walker (2 wheeled) Transfers: Sit to/from Stand Sit to Stand: Min assist         General transfer comment: cues for RLE management and safe hand placement  Ambulation/Gait Ambulation/Gait assistance: Min assist;Min guard Ambulation Distance (Feet): 78 Feet Assistive device: Rolling walker (2 wheeled) Gait Pattern/deviations: Step-to pattern;Shuffle;Trunk flexed;Decreased step length - right;Decreased step length - left Gait velocity: decr Gait velocity interpretation: Below normal speed for age/gender General Gait Details: Increased time with cues for posture, position from RW and sequence.  No c/o dizziness   Stairs            Wheelchair Mobility    Modified Rankin (Stroke Patients Only)       Balance Overall balance assessment: Needs assistance Sitting-balance support: Feet supported;No upper extremity supported Sitting balance-Leahy Scale: Good     Standing  balance support: Bilateral upper extremity supported Standing balance-Leahy Scale: Poor Standing balance comment: reliant on RW                            Cognition Arousal/Alertness: Awake/alert Behavior During Therapy: WFL for tasks assessed/performed Overall Cognitive Status: Within Functional Limits for tasks assessed                                        Exercises Total Joint Exercises Ankle Circles/Pumps: AROM;Both;20 reps;Supine Quad Sets: AROM;Both;10 reps;Supine Heel Slides: AAROM;Right;10 reps;Supine Straight Leg Raises: AAROM;Right;10 reps;Supine Goniometric ROM: AAROM R knee -12 - 30    General Comments        Pertinent Vitals/Pain Pain Assessment: 0-10 Pain Score: 5  Faces Pain Scale: Hurts whole lot (with therex) Pain Location: R knee Pain Descriptors / Indicators: Sore;Aching;Grimacing;Guarding Pain Intervention(s): Limited activity within patient's tolerance;Monitored during session;Premedicated before session    Home Living                      Prior Function            PT Goals (current goals can now be found in the care plan section) Acute Rehab PT Goals Patient Stated Goal: Regain IND PT Goal Formulation: With patient Time For Goal Achievement: 05/31/17 Potential to Achieve Goals: Good Progress towards PT goals: Progressing toward goals    Frequency    7X/week      PT Plan Current plan remains appropriate  Co-evaluation PT/OT/SLP Co-Evaluation/Treatment: Yes Reason for Co-Treatment: For patient/therapist safety PT goals addressed during session: Mobility/safety with mobility OT goals addressed during session: ADL's and self-care;Strengthening/ROM      AM-PAC PT "6 Clicks" Daily Activity  Outcome Measure  Difficulty turning over in bed (including adjusting bedclothes, sheets and blankets)?: Unable Difficulty moving from lying on back to sitting on the side of the bed? : A Lot Difficulty  sitting down on and standing up from a chair with arms (e.g., wheelchair, bedside commode, etc,.)?: A Lot Help needed moving to and from a bed to chair (including a wheelchair)?: A Little Help needed walking in hospital room?: A Little Help needed climbing 3-5 steps with a railing? : A Lot 6 Click Score: 13    End of Session Equipment Utilized During Treatment: Gait belt;Right knee immobilizer Activity Tolerance: Patient tolerated treatment well Patient left: Other (comment) (standing with OT in room) Nurse Communication: Mobility status PT Visit Diagnosis: Difficulty in walking, not elsewhere classified (R26.2)     Time: 1610-96041054-1117 PT Time Calculation (min) (ACUTE ONLY): 23 min  Charges:  $Gait Training: 8-22 mins $Therapeutic Exercise: 8-22 mins                    G Codes:       Pg [phone]    Bryan Carroll 05/28/2017, 12:47 PM

## 2017-05-28 NOTE — Progress Notes (Signed)
Physical Therapy Treatment Patient Details Name: Bryan Carroll MRN: [id number] DOB: 1951-05-08 Today's Date: 05/28/2017    History of Present Illness Pt s/p R TKR    PT Comments    Pt continues cooperative but ltd by fatigue.     Follow Up Recommendations  DC plan and follow up therapy as arranged by surgeon;Home health PT     Equipment Recommendations  Rolling walker with 5" wheels    Recommendations for Other Services OT consult     Precautions / Restrictions Precautions Precautions: Knee;Fall Required Braces or Orthoses: Knee Immobilizer - Right Knee Immobilizer - Right: Discontinue once straight leg raise with < 10 degree lag Restrictions Weight Bearing Restrictions: No Other Position/Activity Restrictions: WBAT    Mobility  Bed Mobility Overal bed mobility: Needs Assistance Bed Mobility: Supine to Sit;Sit to Supine     Supine to sit: Min assist Sit to supine: Min assist   General bed mobility comments: needed A for RLE with KI on  Transfers Overall transfer level: Needs assistance Equipment used: Rolling walker (2 wheeled) Transfers: Sit to/from Stand Sit to Stand: Min guard         General transfer comment: cues for RLE management and safe hand placement  Ambulation/Gait Ambulation/Gait assistance: Min assist;Min guard Ambulation Distance (Feet): 90 Feet Assistive device: Rolling walker (2 wheeled) Gait Pattern/deviations: Step-to pattern;Shuffle;Trunk flexed;Decreased step length - right;Decreased step length - left Gait velocity: decr Gait velocity interpretation: Below normal speed for age/gender General Gait Details: Increased time with cues for posture, position from RW and sequence.  No c/o dizziness   Stairs            Wheelchair Mobility    Modified Rankin (Stroke Patients Only)       Balance Overall balance assessment: Needs assistance Sitting-balance support: Feet supported;No upper extremity supported Sitting  balance-Leahy Scale: Good     Standing balance support: Bilateral upper extremity supported Standing balance-Leahy Scale: Poor Standing balance comment: reliant on RW                            Cognition Arousal/Alertness: Awake/alert Behavior During Therapy: WFL for tasks assessed/performed Overall Cognitive Status: Within Functional Limits for tasks assessed                                        Exercises      General Comments        Pertinent Vitals/Pain Pain Assessment: 0-10 Pain Score: 5  Pain Location: R knee Pain Descriptors / Indicators: Sore;Aching;Grimacing;Guarding Pain Intervention(s): Limited activity within patient's tolerance;Monitored during session;Premedicated before session;Ice applied    Home Living                      Prior Function            PT Goals (current goals can now be found in the care plan section) Acute Rehab PT Goals Patient Stated Goal: Regain IND PT Goal Formulation: With patient Time For Goal Achievement: 05/31/17 Potential to Achieve Goals: Good Progress towards PT goals: Progressing toward goals    Frequency    7X/week      PT Plan Current plan remains appropriate    Co-evaluation PT/OT/SLP Co-Evaluation/Treatment: Yes Reason for Co-Treatment: For patient/therapist safety PT goals addressed during session: Mobility/safety with mobility OT goals addressed during session: ADL's and self-care;Strengthening/ROM  AM-PAC PT "6 Clicks" Daily Activity  Outcome Measure  Difficulty turning over in bed (including adjusting bedclothes, sheets and blankets)?: Unable Difficulty moving from lying on back to sitting on the side of the bed? : A Lot Difficulty sitting down on and standing up from a chair with arms (e.g., wheelchair, bedside commode, etc,.)?: A Lot Help needed moving to and from a bed to chair (including a wheelchair)?: A Little Help needed walking in hospital room?: A  Little Help needed climbing 3-5 steps with a railing? : A Lot 6 Click Score: 13    End of Session Equipment Utilized During Treatment: Gait belt;Right knee immobilizer Activity Tolerance: Patient tolerated treatment well Patient left: Other (comment) Nurse Communication: Mobility status PT Visit Diagnosis: Difficulty in walking, not elsewhere classified (R26.2)     Time: 1610-96041530-1553 PT Time Calculation (min) (ACUTE ONLY): 23 min  Charges:  $Gait Training: 23-37 mins                    G Codes:       Pg [phone]    Bryan Carroll 05/28/2017, 4:17 PM

## 2017-05-28 NOTE — Progress Notes (Signed)
Occupational Therapy Treatment and Discharge Patient Details Name: Bryan Carroll MRN: [id number] DOB: [birth year] Today's Date: 05/28/2017    History of present illness Pt s/p R TKR   OT comments  This 66 yo male admitted and underwent above seen today partially in conjunction with PT yesterday due to BP issues yesterday, but so far today doing better. Focus of today's session was shower stall and toilet tranfers. Pt and wife feel confident in these and no other concerns about basic ADLs, we will sign off.  Follow Up Recommendations  No OT follow up;Supervision/Assistance - 24 hour    Equipment Recommendations  3 in 1 bedside commode       Precautions / Restrictions Precautions Precautions: Knee;Fall Required Braces or Orthoses: Knee Immobilizer - Right Knee Immobilizer - Right: Discontinue once straight leg raise with < 10 degree lag Restrictions Weight Bearing Restrictions: No Other Position/Activity Restrictions: WBAT       Mobility Bed Mobility Overal bed mobility: Needs Assistance Bed Mobility: Supine to Sit     Supine to sit: Min assist     General bed mobility comments: needed A for RLE with KI on  Transfers Overall transfer level: Needs assistance Equipment used: Rolling walker (2 wheeled) Transfers: Sit to/from Stand Sit to Stand: Min assist         General transfer comment: cues for RLE management and safe hand placement    Balance Overall balance assessment: Needs assistance Sitting-balance support: Feet supported;No upper extremity supported Sitting balance-Leahy Scale: Good     Standing balance support: Bilateral upper extremity supported Standing balance-Leahy Scale: Poor Standing balance comment: reliant on RW                           ADL either performed or assessed with clinical judgement   ADL Overall ADL's : Needs assistance/impaired                         Toilet Transfer: Minimal  assistance;Ambulation;RW;BSC Toilet Transfer Details (indicate cue type and reason): over toilet with A for safe hand placement and to bring RLE out     Tub/ Shower Transfer: Walk-in shower;Minimal assistance;Ambulation;Rolling walker Tub/Shower Transfer Details (indicate cue type and reason): backing into shower stall with cues for "in with the good and out with the bad"(wife present)         Vision Patient Visual Report: No change from baseline            Cognition Arousal/Alertness: Awake/alert Behavior During Therapy: WFL for tasks assessed/performed Overall Cognitive Status: Within Functional Limits for tasks assessed                                                     Pertinent Vitals/ Pain       Pain Assessment: 0-10 Pain Score: 5  Faces Pain Scale: Hurts whole lot (with therex) Pain Location: R knee Pain Descriptors / Indicators: Sore;Aching;Grimacing;Guarding Pain Intervention(s): Limited activity within patient's tolerance;Monitored during session;Repositioned;Ice applied         Frequency  Min 2X/week        Progress Toward Goals    Progress towards OT goals:  (All education completed)    Plan Discharge plan remains appropriate    Co-evaluation    PT/OT/SLP Co-Evaluation/Treatment: Yes (partial)  Reason for Co-Treatment: For patient/therapist safety   OT goals addressed during session: ADL's and self-care;Strengthening/ROM      AM-PAC PT "6 Clicks" Daily Activity     Outcome Measure   Help from another person eating meals?: None Help from another person taking care of personal grooming?: A Little Help from another person toileting, which includes using toliet, bedpan, or urinal?: A Little Help from another person bathing (including washing, rinsing, drying)?: A Little Help from another person to put on and taking off regular upper body clothing?: A Little Help from another person to put on and taking off regular lower  body clothing?: A Lot 6 Click Score: 18    End of Session Equipment Utilized During Treatment: Gait belt;Rolling walker  OT Visit Diagnosis: Unsteadiness on feet (R26.81);Pain Pain - Right/Left: Right Pain - part of body: Knee   Activity Tolerance Patient tolerated treatment well   Patient Left in bed;with call bell/phone within reach;with family/visitor present   Nurse Communication  (pt asking about BP meds due to BP high when we were working with him)        Time: 1610-9604 OT Time Calculation (min): 35 min  Charges: OT General Charges $OT Visit: 1 Visit OT Treatments $Self Care/Home Management : 8-22 mins  Ignacia Palma, OTR/L [phone] 05/28/2017

## 2017-05-28 NOTE — Care Management Note (Signed)
Case Management Note  Patient Details  Name: Bryan Carroll MRN: [id number] Date of Birth: [birth year]  Subjective/Objective:    S/p R TKR                  Action/Plan: Discharge Planning: NCM spoke to pt's wife at bedside. Offered choice for HH/list provided. Pt and wife agreeable to [facility]. Contacted [facility] for RW and 3n1 to be delivered to room prior to dc.   PCP Farris HasMORROW, AARON MD  Expected Discharge Date:  05/28/17               Expected Discharge Plan:  Home w Home Health Services  In-House Referral:  NA  Discharge planning Services  CM Consult  Post Acute Care Choice:  Durable Medical Equipment, Home Health Choice offered to:  Patient, Spouse  DME Arranged:  3-N-1, Walker rolling DME Agency:  [facility]  HH Arranged:  PT HH Agency[facility]  [facility]  Status of Service:  Completed, signed off  If discussed at [employer] Length of Stay Meetings, dates discussed:    Additional Comments:  Bryan Carroll, Bryan Aloi [name], RN 05/28/2017, 11:12 AM

## 2017-05-28 NOTE — Progress Notes (Signed)
   Subjective: 2 Days Post-Op Procedure(s) (LRB): RIGHT TOTAL KNEE ARTHROPLASTY (Right)  C/o continued moderate pain to the knee Also c/o swelling to right calf Unable to sleep last night and c/o dry mouth due to narcotics Therapy is tolerable currently Patient reports pain as moderate.  Objective:   VITALS:   Vitals:   05/27/17 1532 05/27/17 2200  BP: (!) 150/74 (!) 141/72  Pulse: 61 72  Resp:    Temp:  98.3 F (36.8 C)  SpO2:  95%    Right knee ace removed aquacel in place nv intact distally Minimal edema to right calf with no tenderness  LABS  Recent Labs  05/27/17 0519 05/28/17 0533  HGB 13.3 13.9  HCT 40.3 42.0  WBC 12.8* 13.7*  PLT 170 166     Recent Labs  05/27/17 0519  NA 140  K 3.9  BUN 8  CREATININE 0.74  GLUCOSE 121*     Assessment/Plan: 2 Days Post-Op Procedure(s) (LRB): RIGHT TOTAL KNEE ARTHROPLASTY (Right) Continue PT/OT ambien tonight for sleep Expect d/c tomorrow after therapy Pain management as needed    Alphonsa OverallBrad Chianne Byrns, MPAS, PA-C  05/28/2017, 8:12 AM

## 2017-05-29 LAB — CBC
HEMATOCRIT: 41 % (ref 39.0–52.0)
Hemoglobin: 13.4 g/dL (ref 13.0–17.0)
MCH: 29.1 pg (ref 26.0–34.0)
MCHC: 32.7 g/dL (ref 30.0–36.0)
MCV: 88.9 fL (ref 78.0–100.0)
PLATELETS: 171 10*3/uL (ref 150–400)
RBC: 4.61 MIL/uL (ref 4.22–5.81)
RDW: 13.5 % (ref 11.5–15.5)
WBC: 13.8 10*3/uL — ABNORMAL HIGH (ref 4.0–10.5)

## 2017-05-29 NOTE — Progress Notes (Signed)
Subjective: 3 Days Post-Op Procedure(s) (LRB): RIGHT TOTAL KNEE ARTHROPLASTY (Right) Patient reports pain as mild to left knee.  tolerating PO's. Positive flatus.Denies SOB,CP, or calf pain.  Objective: Vital signs in last 24 hours: Temp:  [97.6 F (36.4 C)-98.3 F (36.8 C)] 97.6 F (36.4 C) (10/28 0603) Pulse Rate:  [78-95] 95 (10/28 0603) Resp:  [14-17] 17 (10/28 0603) BP: (146-151)/(70-80) 146/80 (10/28 0603) SpO2:  [94 %-98 %] 94 % (10/28 0603)  Intake/Output from previous day: 10/27 0701 - 10/28 0700 In: 1440 [P.O.:1440] Out: 1800 [Urine:1800] Intake/Output this shift: Total I/O In: 240 [P.O.:240] Out: -    Recent Labs  05/27/17 0519 05/28/17 0533 05/29/17 0525  HGB 13.3 13.9 13.4    Recent Labs  05/28/17 0533 05/29/17 0525  WBC 13.7* 13.8*  RBC 4.73 4.61  HCT 42.0 41.0  PLT 166 171    Recent Labs  05/27/17 0519  NA 140  K 3.9  CL 106  CO2 26  BUN 8  CREATININE 0.74  GLUCOSE 121*  CALCIUM 8.6*   No results for input(s): LABPT, INR in the last 72 hours.  Alert and oriented x3. RRR, Lungs clear, BS x4. Left Calf soft and non tender. L knee dressing C/D/I. No DVT signs. No signs of infection or compartment syndrome. LLE grossly neurovascularly intact.   Assessment/Plan: 3 Days Post-Op Procedure(s) (LRB): RIGHT TOTAL KNEE ARTHROPLASTY (Right) D/c home today after PT Pain management F/u in office Follow instructions Take medicine as direcetd ASA  STILWELL, BRYSON L 05/29/2017, 11:29 AM

## 2017-05-29 NOTE — Progress Notes (Signed)
Physical Therapy Treatment Patient Details Name: Bryan Carroll MRN: [id number] DOB: 09-Apr-1951 Today's Date: 05/29/2017    History of Present Illness Pt s/p R TKR    PT Comments    Pt cooperative but continues to fatigue easily and ltd in ROM by pain.   Follow Up Recommendations  DC plan and follow up therapy as arranged by surgeon;Home health PT     Equipment Recommendations  Rolling walker with 5" wheels    Recommendations for Other Services OT consult     Precautions / Restrictions Precautions Precautions: Knee;Fall Required Braces or Orthoses: Knee Immobilizer - Right Knee Immobilizer - Right: Discontinue once straight leg raise with < 10 degree lag Restrictions Weight Bearing Restrictions: No Other Position/Activity Restrictions: WBAT    Mobility  Bed Mobility Overal bed mobility: Needs Assistance Bed Mobility: Supine to Sit     Supine to sit: Min assist     General bed mobility comments: needed A for RLE with KI on  Transfers Overall transfer level: Needs assistance Equipment used: Rolling walker (2 wheeled) Transfers: Sit to/from Stand Sit to Stand: Min guard         General transfer comment: cues for RLE management and safe hand placement  Ambulation/Gait Ambulation/Gait assistance: Min assist;Min guard Ambulation Distance (Feet): 50 Feet Assistive device: Rolling walker (2 wheeled) Gait Pattern/deviations: Step-to pattern;Shuffle;Trunk flexed;Decreased step length - right;Decreased step length - left Gait velocity: decr Gait velocity interpretation: Below normal speed for age/gender General Gait Details: Increased time with cues for posture, position from RW and sequence.  No c/o dizziness   Stairs            Wheelchair Mobility    Modified Rankin (Stroke Patients Only)       Balance Overall balance assessment: Needs assistance Sitting-balance support: Feet supported;No upper extremity supported Sitting balance-Leahy  Scale: Good     Standing balance support: Bilateral upper extremity supported Standing balance-Leahy Scale: Poor Standing balance comment: reliant on RW                            Cognition Arousal/Alertness: Awake/alert Behavior During Therapy: WFL for tasks assessed/performed Overall Cognitive Status: Within Functional Limits for tasks assessed                                        Exercises Total Joint Exercises Ankle Circles/Pumps: AROM;Both;20 reps;Supine Quad Sets: AROM;Both;10 reps;Supine Heel Slides: AAROM;Right;10 reps;Supine Straight Leg Raises: AAROM;Right;10 reps;Supine Goniometric ROM: AAROM R knee -10- 35    General Comments        Pertinent Vitals/Pain Pain Assessment: 0-10 Pain Score: 6  Pain Location: R knee Pain Descriptors / Indicators: Sore;Aching;Grimacing;Guarding Pain Intervention(s): Limited activity within patient's tolerance;Monitored during session;Premedicated before session;Ice applied    Home Living                      Prior Function            PT Goals (current goals can now be found in the care plan section) Acute Rehab PT Goals Patient Stated Goal: Regain IND PT Goal Formulation: With patient Time For Goal Achievement: 05/31/17 Potential to Achieve Goals: Good Progress towards PT goals: Progressing toward goals    Frequency    7X/week      PT Plan Current plan remains appropriate    Co-evaluation  AM-PAC PT "6 Clicks" Daily Activity  Outcome Measure  Difficulty turning over in bed (including adjusting bedclothes, sheets and blankets)?: Unable Difficulty moving from lying on back to sitting on the side of the bed? : A Lot Difficulty sitting down on and standing up from a chair with arms (e.g., wheelchair, bedside commode, etc,.)?: A Lot Help needed moving to and from a bed to chair (including a wheelchair)?: A Little Help needed walking in hospital room?: A  Little Help needed climbing 3-5 steps with a railing? : A Lot 6 Click Score: 13    End of Session Equipment Utilized During Treatment: Gait belt;Right knee immobilizer Activity Tolerance: Patient tolerated treatment well;Patient limited by fatigue;Patient limited by pain Patient left: in chair;with call bell/phone within reach;with family/visitor present Nurse Communication: Mobility status PT Visit Diagnosis: Difficulty in walking, not elsewhere classified (R26.2)     Time: 1003-1100 PT Time Calculation (min) (ACUTE ONLY): 57 min  Charges:  $Gait Training: 8-22 mins $Therapeutic Exercise: 8-22 mins $Therapeutic Activity: 8-22 mins                    G Codes:       Pg [phone]    Elspeth Blucher 05/29/2017, 3:22 PM

## 2017-05-29 NOTE — Progress Notes (Signed)
Discharged from floor via w/c for transport home by car. Family & belongings with pt. No changes in assessment. Bryan Carroll  

## 2017-05-29 NOTE — Progress Notes (Signed)
Physical Therapy Treatment Patient Details Name: Bryan Carroll MRN: [id number] DOB: 04-Nov-1950 Today's Date: 05/29/2017    History of Present Illness Pt s/p R TKR    PT Comments    Pt motivated to dc home this date.  Pt negotiated stairs with family present and written instruction provided.   Follow Up Recommendations  DC plan and follow up therapy as arranged by surgeon;Home health PT     Equipment Recommendations  Rolling walker with 5" wheels    Recommendations for Other Services OT consult     Precautions / Restrictions Precautions Precautions: Knee;Fall Required Braces or Orthoses: Knee Immobilizer - Right Knee Immobilizer - Right: Discontinue once straight leg raise with < 10 degree lag Restrictions Weight Bearing Restrictions: No Other Position/Activity Restrictions: WBAT    Mobility  Bed Mobility Overal bed mobility: Needs Assistance Bed Mobility: Supine to Sit;Sit to Supine     Supine to sit: Min assist Sit to supine: Min assist   General bed mobility comments: needed A for RLE with KI on  Transfers Overall transfer level: Needs assistance Equipment used: Rolling walker (2 wheeled) Transfers: Sit to/from Stand Sit to Stand: Min guard;Supervision         General transfer comment: cues for RLE management and safe hand placement  Ambulation/Gait Ambulation/Gait assistance: Min guard Ambulation Distance (Feet): 40 Feet Assistive device: Rolling walker (2 wheeled) Gait Pattern/deviations: Step-to pattern;Shuffle;Trunk flexed;Decreased step length - right;Decreased step length - left Gait velocity: decr Gait velocity interpretation: Below normal speed for age/gender General Gait Details: Increased time with cues for posture, position from RW and sequence.  No c/o dizziness   Stairs Stairs: Yes   Stair Management: No rails;Step to pattern;Backwards;With walker Number of Stairs: 2 General stair comments: cues for sequence and foot/RW  placement.  Reveiwed with spouse and son and written instruction provided  Wheelchair Mobility    Modified Rankin (Stroke Patients Only)       Balance Overall balance assessment: Needs assistance Sitting-balance support: Feet supported;No upper extremity supported Sitting balance-Leahy Scale: Good     Standing balance support: Bilateral upper extremity supported Standing balance-Leahy Scale: Poor Standing balance comment: reliant on RW                            Cognition Arousal/Alertness: Awake/alert Behavior During Therapy: WFL for tasks assessed/performed Overall Cognitive Status: Within Functional Limits for tasks assessed                                        Exercises Total Joint Exercises Ankle Circles/Pumps: AROM;Both;20 reps;Supine Quad Sets: AROM;Both;10 reps;Supine Heel Slides: AAROM;Right;10 reps;Supine Straight Leg Raises: AAROM;Right;10 reps;Supine Goniometric ROM: AAROM R knee -10- 35    General Comments        Pertinent Vitals/Pain Pain Assessment: 0-10 Pain Score: 4  Pain Location: R knee Pain Descriptors / Indicators: Sore;Aching;Grimacing;Guarding Pain Intervention(s): Limited activity within patient's tolerance;Monitored during session;Premedicated before session;Ice applied    Home Living                      Prior Function            PT Goals (current goals can now be found in the care plan section) Acute Rehab PT Goals Patient Stated Goal: Regain IND PT Goal Formulation: With patient Time For Goal Achievement: 05/31/17 Potential to Achieve  Goals: Good Progress towards PT goals: Progressing toward goals    Frequency    7X/week      PT Plan Current plan remains appropriate    Co-evaluation              AM-PAC PT "6 Clicks" Daily Activity  Outcome Measure  Difficulty turning over in bed (including adjusting bedclothes, sheets and blankets)?: Unable Difficulty moving from lying  on back to sitting on the side of the bed? : A Lot Difficulty sitting down on and standing up from a chair with arms (e.g., wheelchair, bedside commode, etc,.)?: A Lot Help needed moving to and from a bed to chair (including a wheelchair)?: A Little Help needed walking in hospital room?: A Little Help needed climbing 3-5 steps with a railing? : A Lot 6 Click Score: 13    End of Session Equipment Utilized During Treatment: Gait belt;Right knee immobilizer Activity Tolerance: Patient tolerated treatment well;Patient limited by fatigue;Patient limited by pain Patient left: with call bell/phone within reach;with family/visitor present;in bed Nurse Communication: Mobility status PT Visit Diagnosis: Difficulty in walking, not elsewhere classified (R26.2)     Time: 1410-1433 PT Time Calculation (min) (ACUTE ONLY): 23 min  Charges:  $Gait Training: 23-37 mins $Therapeutic Exercise: 8-22 mins $Therapeutic Activity: 8-22 mins                    G Codes:       Pg [phone]    Lorraine Terriquez 05/29/2017, 3:28 PM

## 2017-05-30 DIAGNOSIS — K219 Gastro-esophageal reflux disease without esophagitis: Secondary | ICD-10-CM | POA: Diagnosis not present

## 2017-05-30 DIAGNOSIS — Z96651 Presence of right artificial knee joint: Secondary | ICD-10-CM | POA: Diagnosis not present

## 2017-05-30 DIAGNOSIS — Z9181 History of falling: Secondary | ICD-10-CM | POA: Diagnosis not present

## 2017-05-30 DIAGNOSIS — I10 Essential (primary) hypertension: Secondary | ICD-10-CM | POA: Diagnosis not present

## 2017-05-30 DIAGNOSIS — M1712 Unilateral primary osteoarthritis, left knee: Secondary | ICD-10-CM | POA: Diagnosis not present

## 2017-05-30 DIAGNOSIS — Z471 Aftercare following joint replacement surgery: Secondary | ICD-10-CM | POA: Diagnosis not present

## 2017-05-30 DIAGNOSIS — K635 Polyp of colon: Secondary | ICD-10-CM | POA: Diagnosis not present

## 2017-06-01 DIAGNOSIS — M1712 Unilateral primary osteoarthritis, left knee: Secondary | ICD-10-CM | POA: Diagnosis not present

## 2017-06-01 DIAGNOSIS — K219 Gastro-esophageal reflux disease without esophagitis: Secondary | ICD-10-CM | POA: Diagnosis not present

## 2017-06-01 DIAGNOSIS — I10 Essential (primary) hypertension: Secondary | ICD-10-CM | POA: Diagnosis not present

## 2017-06-01 DIAGNOSIS — Z471 Aftercare following joint replacement surgery: Secondary | ICD-10-CM | POA: Diagnosis not present

## 2017-06-01 DIAGNOSIS — Z9181 History of falling: Secondary | ICD-10-CM | POA: Diagnosis not present

## 2017-06-01 DIAGNOSIS — K635 Polyp of colon: Secondary | ICD-10-CM | POA: Diagnosis not present

## 2017-06-01 DIAGNOSIS — Z96651 Presence of right artificial knee joint: Secondary | ICD-10-CM | POA: Diagnosis not present

## 2017-06-03 DIAGNOSIS — Z9181 History of falling: Secondary | ICD-10-CM | POA: Diagnosis not present

## 2017-06-03 DIAGNOSIS — K635 Polyp of colon: Secondary | ICD-10-CM | POA: Diagnosis not present

## 2017-06-03 DIAGNOSIS — K219 Gastro-esophageal reflux disease without esophagitis: Secondary | ICD-10-CM | POA: Diagnosis not present

## 2017-06-03 DIAGNOSIS — Z471 Aftercare following joint replacement surgery: Secondary | ICD-10-CM | POA: Diagnosis not present

## 2017-06-03 DIAGNOSIS — Z96651 Presence of right artificial knee joint: Secondary | ICD-10-CM | POA: Diagnosis not present

## 2017-06-03 DIAGNOSIS — I10 Essential (primary) hypertension: Secondary | ICD-10-CM | POA: Diagnosis not present

## 2017-06-03 DIAGNOSIS — M1712 Unilateral primary osteoarthritis, left knee: Secondary | ICD-10-CM | POA: Diagnosis not present

## 2017-06-06 DIAGNOSIS — I10 Essential (primary) hypertension: Secondary | ICD-10-CM | POA: Diagnosis not present

## 2017-06-06 DIAGNOSIS — Z9181 History of falling: Secondary | ICD-10-CM | POA: Diagnosis not present

## 2017-06-06 DIAGNOSIS — Z96651 Presence of right artificial knee joint: Secondary | ICD-10-CM | POA: Diagnosis not present

## 2017-06-06 DIAGNOSIS — Z471 Aftercare following joint replacement surgery: Secondary | ICD-10-CM | POA: Diagnosis not present

## 2017-06-06 DIAGNOSIS — M1712 Unilateral primary osteoarthritis, left knee: Secondary | ICD-10-CM | POA: Diagnosis not present

## 2017-06-06 DIAGNOSIS — K635 Polyp of colon: Secondary | ICD-10-CM | POA: Diagnosis not present

## 2017-06-06 DIAGNOSIS — K219 Gastro-esophageal reflux disease without esophagitis: Secondary | ICD-10-CM | POA: Diagnosis not present

## 2017-06-08 DIAGNOSIS — M1712 Unilateral primary osteoarthritis, left knee: Secondary | ICD-10-CM | POA: Diagnosis not present

## 2017-06-08 DIAGNOSIS — Z96651 Presence of right artificial knee joint: Secondary | ICD-10-CM | POA: Diagnosis not present

## 2017-06-08 DIAGNOSIS — Z471 Aftercare following joint replacement surgery: Secondary | ICD-10-CM | POA: Diagnosis not present

## 2017-06-08 DIAGNOSIS — K635 Polyp of colon: Secondary | ICD-10-CM | POA: Diagnosis not present

## 2017-06-08 DIAGNOSIS — Z9181 History of falling: Secondary | ICD-10-CM | POA: Diagnosis not present

## 2017-06-08 DIAGNOSIS — I10 Essential (primary) hypertension: Secondary | ICD-10-CM | POA: Diagnosis not present

## 2017-06-08 DIAGNOSIS — K219 Gastro-esophageal reflux disease without esophagitis: Secondary | ICD-10-CM | POA: Diagnosis not present

## 2017-06-09 DIAGNOSIS — Z471 Aftercare following joint replacement surgery: Secondary | ICD-10-CM | POA: Diagnosis not present

## 2017-06-09 DIAGNOSIS — I10 Essential (primary) hypertension: Secondary | ICD-10-CM | POA: Diagnosis not present

## 2017-06-09 DIAGNOSIS — M1712 Unilateral primary osteoarthritis, left knee: Secondary | ICD-10-CM | POA: Diagnosis not present

## 2017-06-09 DIAGNOSIS — K219 Gastro-esophageal reflux disease without esophagitis: Secondary | ICD-10-CM | POA: Diagnosis not present

## 2017-06-09 DIAGNOSIS — K635 Polyp of colon: Secondary | ICD-10-CM | POA: Diagnosis not present

## 2017-06-09 DIAGNOSIS — Z9181 History of falling: Secondary | ICD-10-CM | POA: Diagnosis not present

## 2017-06-09 DIAGNOSIS — Z96651 Presence of right artificial knee joint: Secondary | ICD-10-CM | POA: Diagnosis not present

## 2017-06-13 ENCOUNTER — Ambulatory Visit (HOSPITAL_BASED_OUTPATIENT_CLINIC_OR_DEPARTMENT_OTHER)
Admission: RE | Admit: 2017-06-13 | Discharge: 2017-06-13 | Disposition: A | Discharge status: Home / Self Care | Payer: BLUE CROSS/BLUE SHIELD | Source: Ambulatory Visit | Attending: Cardiology | Admitting: Cardiology

## 2017-06-13 ENCOUNTER — Other Ambulatory Visit (HOSPITAL_COMMUNITY): Payer: Self-pay | Admitting: Orthopedic Surgery

## 2017-06-13 ENCOUNTER — Emergency Department (HOSPITAL_COMMUNITY)
Admission: EM | Admit: 2017-06-13 | Discharge: 2017-06-13 | Disposition: A | Discharge status: Home / Self Care | Payer: BLUE CROSS/BLUE SHIELD | Attending: Emergency Medicine | Admitting: Emergency Medicine

## 2017-06-13 DIAGNOSIS — M79604 Pain in right leg: Secondary | ICD-10-CM

## 2017-06-13 DIAGNOSIS — M7989 Other specified soft tissue disorders: Secondary | ICD-10-CM

## 2017-06-13 DIAGNOSIS — Z7982 Long term (current) use of aspirin: Secondary | ICD-10-CM | POA: Insufficient documentation

## 2017-06-13 DIAGNOSIS — Z96651 Presence of right artificial knee joint: Secondary | ICD-10-CM | POA: Insufficient documentation

## 2017-06-13 DIAGNOSIS — I10 Essential (primary) hypertension: Secondary | ICD-10-CM | POA: Insufficient documentation

## 2017-06-13 DIAGNOSIS — Z79899 Other long term (current) drug therapy: Secondary | ICD-10-CM | POA: Diagnosis not present

## 2017-06-13 DIAGNOSIS — R590 Localized enlarged lymph nodes: Secondary | ICD-10-CM

## 2017-06-13 DIAGNOSIS — Z9889 Other specified postprocedural states: Secondary | ICD-10-CM

## 2017-06-13 DIAGNOSIS — R799 Abnormal finding of blood chemistry, unspecified: Secondary | ICD-10-CM | POA: Diagnosis not present

## 2017-06-13 DIAGNOSIS — Z86718 Personal history of other venous thrombosis and embolism: Secondary | ICD-10-CM

## 2017-06-13 DIAGNOSIS — I82441 Acute embolism and thrombosis of right tibial vein: Secondary | ICD-10-CM | POA: Diagnosis not present

## 2017-06-13 DIAGNOSIS — Z471 Aftercare following joint replacement surgery: Secondary | ICD-10-CM | POA: Diagnosis not present

## 2017-06-13 HISTORY — DX: Personal history of other venous thrombosis and embolism: Z86.718

## 2017-06-13 MED ORDER — RIVAROXABAN 20 MG PO TABS: 20.0000 mg | ORAL_TABLET | Freq: Every day | ORAL | Status: DC

## 2017-06-13 MED ORDER — RIVAROXABAN (XARELTO) EDUCATION KIT FOR DVT/PE PATIENTS
PACK | Freq: Once | Status: AC
  Administered 2017-06-13: 17:00:00
  Filled 2017-06-13: qty 1

## 2017-06-13 MED ORDER — RIVAROXABAN 15 MG PO TABS: 15.0000 mg | ORAL_TABLET | Freq: Once | ORAL | Status: DC

## 2017-06-13 MED ORDER — RIVAROXABAN 15 MG PO TABS
15.0000 mg | ORAL_TABLET | Freq: Two times a day (BID) | ORAL | Status: DC
  Administered 2017-06-13: 15 mg via ORAL
  Filled 2017-06-13: qty 1

## 2017-06-13 MED ORDER — RIVAROXABAN (XARELTO) VTE STARTER PACK (15 & 20 MG): ORAL_TABLET | ORAL | 0 refills | Status: DC

## 2017-06-13 NOTE — Discharge Instructions (Addendum)
Xarelto is a medication used to treat a blood clots in the legs. The major side effect of this medication is that it can increased your risk of bleeding. If you have a fall while you are taking this medication, it is important you come to the Emergency Department for evaluation.  Start by taking one 15 mg tablet of Xarelto by mouth two times per day with food. Today, 11/12 is day one of your medication. On day 22, switch to the 20 mg tablet once a day with food.   Typically this medication is taken for 3 months for the type of blood clot in your leg. Please follow up with your primary care physician in the next week for re-evaluation.   If you develop new symptoms such as chest pain or shortness of breath while taking this medication, please return to the Emergency Department for re-evaluation.

## 2017-06-13 NOTE — ED Triage Notes (Signed)
Per Pt: Pt reports that he recently had a knee surgery. [facility] done of his leg where they found a blood clot. The MD called the pt's primary care doctor and they were unable to see him today, but the physician told the pt that he needed "to be placed on a blood thinner today".  Pt presents to the ED requesting treatment for the blood clot in his leg. Pt denies any other symptoms at this time.  Pt AxO x4 and ambulatory to room

## 2017-06-13 NOTE — ED Notes (Addendum)
I brought patient from lobby to room 21(19 was just mopped, didn't want him to slip). Patient or family member did not ask for a wheelchair. Patient has personal walker. Patient walked to our resus rooms, family asked "how much further? He just had surgery and cannot walk so far" I offered to patient and family, I could go get a wheelchair, patient stated "we can get one when I am leaving, I am okay." Patient and family taken to room 21. RN Waldo LaineKaitlin aware that patient did not ask for wheelchair.

## 2017-06-13 NOTE — Progress Notes (Signed)
ANTICOAGULATION CONSULT NOTE - Initial Consult  Pharmacy Consult for xarelto Indication: DVT  No Known Allergies  Patient Measurements: Height: 5' 9"  (175.3 cm) Weight: 220 lb (99.8 kg) IBW/kg (Calculated) : 70.7   Vital Signs: Temp: 98.9 F (37.2 C) (11/12 1507) Temp Source: Oral (11/12 1507) BP: 127/76 (11/12 1507) Pulse Rate: 98 (11/12 1507)  Labs: No results for input(s): HGB, HCT, PLT, APTT, LABPROT, INR, HEPARINUNFRC, HEPRLOWMOCWT, CREATININE, CKTOTAL, CKMB, TROPONINI in the last 72 hours.  Estimated Creatinine Clearance: 108.6 mL/min (by C-G formula based on SCr of 0.74 mg/dL).   Medical History: Past Medical History:  Diagnosis Date  . Colon polyp   . GERD (gastroesophageal reflux disease)   . History of hiatal hernia    inguinal hernia right   . Hypertension   . Palpitations   . PVC (premature ventricular contraction)   . Trigeminy     Assessment: 66 yo M s/p R TKR 10/25 who presents to the ED with an acute occlusive DVT of R leg. Pharmacy consulted to dose xarelto.   10/26 Cr 0.74, 10/28 CBC OK. Wt 99.8 kg. Pt was dc'd on asa 325 mg po BID but pt taking differently, 81 mg daily.    Plan:  xarelto 15 mg po bid x 21 days then xarelto 20 mg qsupper xarelto DC kit with 30 day free card ordered for pt Will educate patient in ED  Eudelia Bunch, Pharm.D. [phone] 06/13/2017 4:14 PM

## 2017-06-13 NOTE — ED Provider Notes (Signed)
[facility] DEPT Provider Note   CSN: [id number] Arrival date & time: 06/13/17  1439     History   Chief Complaint Chief Complaint  Patient presents with  . Blood Clot    HPI Philander Ake is a 66 y.o. male with a h/o of right TKR on 05/26/17 who presents to the Emergency Department with a chief complaint of abnormal test result.  He reports he had a DVT study performed earlier today, which was positive, and was told to follow up with his PCP. He called his PCP, but he was out of town so he was told to come to the ED for evaluation.  He denies chest pain, shortness of breath, palpitations, back pain, fever, chills, groin or thigh pain. No treatment PTA.   The history is provided by the patient. No language interpreter was used.    Past Medical History:  Diagnosis Date  . Colon polyp   . GERD (gastroesophageal reflux disease)   . History of hiatal hernia    inguinal hernia right   . Hypertension   . Palpitations   . PVC (premature ventricular contraction)   . Trigeminy     Patient Active Problem List   Diagnosis Date Noted  . Primary osteoarthritis of right knee 05/26/2017  . Right knee DJD 05/26/2017    Past Surgical History:  Procedure Laterality Date  . HERNIA REPAIR     right inguinal hernia        Home Medications    Prior to Admission medications   Medication Sig Start Date End Date Taking? Authorizing Provider  aspirin EC 325 MG tablet Take 1 tablet (325 mg total) by mouth 2 (two) times daily. Patient taking differently: Take 81 mg once by mouth.  05/26/17  Yes Susa Day, MD  ibuprofen (ADVIL,MOTRIN) 200 MG tablet Take 2 tablets (400 mg total) by mouth every 6 (six) hours as needed for mild pain. May resume 1 week post-op as needed 05/27/17  Yes Bissell, Ellison Hughs M, PA-C  lisinopril (PRINIVIL,ZESTRIL) 20 MG tablet Take 20 mg by mouth daily.   Yes [provider]  oxyCODONE-acetaminophen (PERCOCET) 5-325 MG  tablet Take 1-2 tablets by mouth every 4 (four) hours as needed for severe pain. 05/26/17  Yes Susa Day, MD  Rivaroxaban 15 & 20 MG TBPK Take as directed on package: Start with one 34m tablet by mouth twice a day with food. On Day 22, switch to one 262mtablet once a day with food. 06/13/17   McDonald, MiLaymond PurserPA-C    Family History Family History  Problem Relation Age of Onset  . Hypertension Mother   . Heart disease Brother   . Heart disease Brother     Social History Social History   Tobacco Use  . Smoking status: Never Smoker  . Smokeless tobacco: Never Used  Substance Use Topics  . Alcohol use: Yes    Alcohol/week: 0.6 - 1.2 oz    Types: 1 - 2 Glasses of wine per week    Comment: occasionally   . Drug use: No     Allergies   Patient has no known allergies.   Review of Systems Review of Systems  Constitutional: Negative for activity change, chills and fever.  Respiratory: Negative for apnea, cough, choking, chest tightness, shortness of breath, wheezing and stridor.   Cardiovascular: Positive for leg swelling. Negative for chest pain and palpitations.  Gastrointestinal: Negative for abdominal pain.  Musculoskeletal: Positive for gait problem and myalgias. Negative  for arthralgias and back pain.  Skin: Negative for rash.  Neurological: Negative for dizziness, weakness and numbness.     Physical Exam Updated Vital Signs BP 129/78   Pulse 99   Temp 98.9 F (37.2 C) (Oral)   Resp 17   Ht 5' 9"  (1.753 m)   Wt 99.8 kg (220 lb)   SpO2 99%   BMI 32.49 kg/m   Physical Exam  Constitutional: He appears well-developed.  HENT:  Head: Normocephalic.  Eyes: Conjunctivae are normal.  Neck: Neck supple.  Cardiovascular: Normal rate and regular rhythm.  No murmur heard. Pulmonary/Chest: Effort normal.  Abdominal: Soft. He exhibits no distension.  Musculoskeletal:  Well-healing midline scar over the right knee.  5 out of 5 strength with dorsiflexion  plantarflexion against resistance.  DP and PT pulses are 2+ and symmetric.  There is moderate edema and mild warmth over the right knee.  Minimal erythema.  Tender to palpation over the posterior aspect of the right knee.  Minimal tenderness to palpation over the medial and lateral aspects of the right knee.  Decreased range of motion secondary to pain.   Neurological: He is alert.  Skin: Skin is warm and dry.  Psychiatric: His behavior is normal.  Nursing note and vitals reviewed.    ED Treatments / Results  Labs (all labs ordered are listed, but only abnormal results are displayed) Labs Reviewed - No data to display  EKG  EKG Interpretation None       Radiology No results found.  Procedures Procedures (including critical care time)  Medications Ordered in ED Medications  rivaroxaban [facility] Reasons) Education Kit for DVT/PE patients ( Does not apply Given 06/13/17 1645)     Initial Impression / Assessment and Plan / ED Course  I have reviewed the triage vital signs and the nursing notes.  Pertinent labs & imaging results that were available during my care of the patient were reviewed by me and considered in my medical decision making (see chart for details).     66 year old male with a h/o of a right TKA on 10/28 presenting after a DVT started this morning that was positive.  DVT noted in 1 of the paired right proximal posterior tibial veins in the right leg.  He denies chest pain, shortness of breath, palpitations.  Doubt pulmonary embolism.  At this time, I do not feel the patient warrants inpatient admission.  Will give the patient has first dose of Xarelto in the emergency department.  Consulted pharmacy for Xarelto dosing and information.  Educated the patient on initiating anticoagulation.  We will discharge the patient to home with Xarelto and encouraged follow-up with his PCP within the next week.  Strict return precautions given.  No acute distress.  Patient is safe for  discharge at this time.  Final Clinical Impressions(s) / ED Diagnoses   Final diagnoses:  Acute deep vein thrombosis (DVT) of tibial vein of right lower extremity [facility])    ED Discharge Orders        Ordered    Rivaroxaban 15 & 20 MG TBPK     06/13/17 1608       McDonald, Mia A, PA-C 06/13/17 2334    Margette Fast, MD 06/13/17 2357

## 2017-06-20 DIAGNOSIS — R319 Hematuria, unspecified: Secondary | ICD-10-CM | POA: Diagnosis not present

## 2017-06-20 DIAGNOSIS — N390 Urinary tract infection, site not specified: Secondary | ICD-10-CM | POA: Diagnosis not present

## 2017-06-27 DIAGNOSIS — I82409 Acute embolism and thrombosis of unspecified deep veins of unspecified lower extremity: Secondary | ICD-10-CM | POA: Diagnosis not present

## 2017-06-27 DIAGNOSIS — R319 Hematuria, unspecified: Secondary | ICD-10-CM | POA: Diagnosis not present

## 2017-06-28 DIAGNOSIS — M25661 Stiffness of right knee, not elsewhere classified: Secondary | ICD-10-CM | POA: Diagnosis not present

## 2017-06-30 DIAGNOSIS — M25661 Stiffness of right knee, not elsewhere classified: Secondary | ICD-10-CM | POA: Diagnosis not present

## 2017-07-04 DIAGNOSIS — Z471 Aftercare following joint replacement surgery: Secondary | ICD-10-CM | POA: Diagnosis not present

## 2017-07-04 DIAGNOSIS — M25661 Stiffness of right knee, not elsewhere classified: Secondary | ICD-10-CM | POA: Diagnosis not present

## 2017-07-04 DIAGNOSIS — Z96651 Presence of right artificial knee joint: Secondary | ICD-10-CM | POA: Diagnosis not present

## 2017-07-06 DIAGNOSIS — M25661 Stiffness of right knee, not elsewhere classified: Secondary | ICD-10-CM | POA: Diagnosis not present

## 2017-07-08 DIAGNOSIS — M25661 Stiffness of right knee, not elsewhere classified: Secondary | ICD-10-CM | POA: Diagnosis not present

## 2017-07-13 DIAGNOSIS — M25661 Stiffness of right knee, not elsewhere classified: Secondary | ICD-10-CM | POA: Diagnosis not present

## 2017-07-15 DIAGNOSIS — M25661 Stiffness of right knee, not elsewhere classified: Secondary | ICD-10-CM | POA: Diagnosis not present

## 2017-07-18 ENCOUNTER — Ambulatory Visit: Payer: Self-pay | Admitting: Orthopedic Surgery

## 2017-07-18 DIAGNOSIS — I10 Essential (primary) hypertension: Secondary | ICD-10-CM | POA: Diagnosis not present

## 2017-07-18 DIAGNOSIS — I82409 Acute embolism and thrombosis of unspecified deep veins of unspecified lower extremity: Secondary | ICD-10-CM | POA: Diagnosis not present

## 2017-07-18 DIAGNOSIS — R3129 Other microscopic hematuria: Secondary | ICD-10-CM | POA: Diagnosis not present

## 2017-07-18 NOTE — H&P (Signed)
Bryan Carroll is an 66 y.o. male.   Chief Complaint: R knee stiffness and pain HPI: The patient is a 66 year old male presenting for a post-operative visit. Patient is 7 wees, 4 days postop following their right total knee arthroplasty. The patient does report pain and swelling The patient does indicate that these symptoms are gradually improving. Pain medications include: Percocet . Post operative complications include DVT (currently taking Eliquis). The patient has started physical therapy. Note for "Post op": The patient is scheduled for a right knee EUA/MUA this Friday 07/22/2017.  Note:He follows up today for his right knee, 7 weeks and 4 days out from total knee replacement. Dr. Beane talk to his therapist last week, he does not seem to be making any further progress on flexion and they had discussed at his last visit potentially needing a manipulation. He is really not icing and elevating as much as he has been advised, admits that he has been up and moving around more and icing less. He is taking Eliquis for DVT, states that he has 5 mg tabs but is only taking half of what he is supposed to, his PCP is not aware of that. He is taking Percocet prior to physical therapy for pain, also on iron right now. He is already scheduled for a knee manipulation on Friday to secure a spot for him.  Past Medical History:  Diagnosis Date  . Colon polyp   . GERD (gastroesophageal reflux disease)   . History of hiatal hernia    inguinal hernia right   . Hypertension   . Palpitations   . PVC (premature ventricular contraction)   . Trigeminy     Past Surgical History:  Procedure Laterality Date  . HERNIA REPAIR     right inguinal hernia   . TOTAL KNEE ARTHROPLASTY Right 05/26/2017   Procedure: RIGHT TOTAL KNEE ARTHROPLASTY;  Surgeon: Beane, Jeffrey, MD;  Location: [facility];  Service: Orthopedics;  Laterality: Right;  120 mins    Family History  Problem Relation Age of Onset  .  Hypertension Mother   . Heart disease Brother   . Heart disease Brother    Social History:  reports that  has never smoked. he has never used smokeless tobacco. He reports that he drinks about 0.6 - 1.2 oz of alcohol per week. He reports that he does not use drugs.  Allergies: No Known Allergies   (Not in a hospital admission)  No results found for this or any previous visit (from the past 48 hour(s)). No results found.  Review of Systems  Constitutional: Negative.   HENT: Negative.   Eyes: Negative.   Respiratory: Negative.   Cardiovascular: Negative.   Gastrointestinal: Negative.   Genitourinary: Negative.   Musculoskeletal: Positive for joint pain.  Skin: Negative.   Neurological: Negative.   Psychiatric/Behavioral: Negative.     There were no vitals taken for this visit. Physical Exam  Constitutional: He is oriented to person, place, and time. He appears well-developed.  HENT:  Head: Normocephalic.  Eyes: Pupils are equal, round, and reactive to light.  Neck: Normal range of motion.  Cardiovascular: Normal rate.  Respiratory: Effort normal.  GI: Soft.  Musculoskeletal:  Awake, alert, oriented 3. No acute distress. Well-nourished and well-developed. Ambulates with a cane, antalgic gait.  Right knee moderate soft tissue swelling, as well as moderate lower extremity edema, pitting. No calf pain. He is able to passively extend to 0, flexion however is limited to approximately 85 passively and   actively. He reports still quad tightness and pain with this. No instability, no pain or laxity with varus or valgus stress.  Neurological: He is alert and oriented to person, place, and time.  Skin: Skin is warm and dry.    Prior x-rays from the last visit reviewed today with total knee prosthesis in excellent alignment with no signs of osteolysis or loosening.  Assessment/Plan Seven and a half weeks status post right total knee replacement, arthrofibrosis, flexion  contracture  Plan: We discussed treatment options. He has failed to progress with physical therapy, flexion contracture with flexion of 85 passively and actively at this point. This is limiting his progress as well as his function and ability to ambulate properly. Given all of this, we discussed proceeding with right knee exam and manipulation under anesthesia. We discussed the procedure itself as well as risks, complications and alternatives. Patient does desire to proceed with surgery as scheduled for this Friday the 21st. He will restart physical therapy ASAP, Artie has an appointment scheduled here for the 26th, he will need to keep up with home exercises on his own in the interim. He informed us today he is only taking half the dose of Eliquis as he does not want to be on a blood thinner and is hoping to go back on his aspirin. We had advised him to let his PCP know about this. We will hold off on PT this week and save those visits for postoperatively, he can do exercises on his own still to work on motion and strength. Patient was seen in conjunction with Dr. Shelle IronBeane today. All questions were answered and he desires to proceed. We will see him back 1 week postop for range of motion recheck.  Plan R knee exam and manip under anesthesia  Dorothy SparkBISSELL, Loukisha Gunnerson M., PA-C for Dr. Shelle IronBeane 07/18/2017, 2:22 PM

## 2017-07-18 NOTE — H&P (View-Only) (Signed)
Bryan Carroll is anLangston Carroll 66 y.o. male.   Chief Complaint: R knee stiffness and pain HPI: The patient is a 66 year old male presenting for a post-operative visit. Patient is 7 wees, 4 days postop following their right total knee arthroplasty. The patient does report pain and swelling The patient does indicate that these symptoms are gradually improving. Pain medications include: Percocet . Post operative complications include DVT (currently taking Eliquis). The patient has started physical therapy. Note for "Post op": The patient is scheduled for a right knee EUA/MUA this Friday 07/22/2017.  Note:He follows up today for his right knee, 7 weeks and 4 days out from total knee replacement. Dr. Shelle Carroll talk to his therapist last week, he does not seem to be making any further progress on flexion and they had discussed at his last visit potentially needing a manipulation. He is really not icing and elevating as much as he has been advised, admits that he has been up and moving around more and icing less. He is taking Eliquis for DVT, states that he has 5 mg tabs but is only taking half of what he is supposed to, his PCP is not aware of that. He is taking Percocet prior to physical therapy for pain, also on iron right now. He is already scheduled for a knee manipulation on Friday to secure a spot for him.  Past Medical History:  Diagnosis Date  . Colon polyp   . GERD (gastroesophageal reflux disease)   . History of hiatal hernia    inguinal hernia right   . Hypertension   . Palpitations   . PVC (premature ventricular contraction)   . Trigeminy     Past Surgical History:  Procedure Laterality Date  . HERNIA REPAIR     right inguinal hernia   . TOTAL KNEE ARTHROPLASTY Right 05/26/2017   Procedure: RIGHT TOTAL KNEE ARTHROPLASTY;  Surgeon: Bryan EveryBeane, Jeffrey, MD;  Location: [facility];  Service: Orthopedics;  Laterality: Right;  120 mins    Family History  Problem Relation Age of Onset  .  Hypertension Mother   . Heart disease Brother   . Heart disease Brother    Social History:  reports that  has never smoked. he has never used smokeless tobacco. He reports that he drinks about 0.6 - 1.2 oz of alcohol per week. He reports that he does not use drugs.  Allergies: No Known Allergies   (Not in a hospital admission)  No results found for this or any previous visit (from the past 48 hour(s)). No results found.  Review of Systems  Constitutional: Negative.   HENT: Negative.   Eyes: Negative.   Respiratory: Negative.   Cardiovascular: Negative.   Gastrointestinal: Negative.   Genitourinary: Negative.   Musculoskeletal: Positive for joint pain.  Skin: Negative.   Neurological: Negative.   Psychiatric/Behavioral: Negative.     There were no vitals taken for this visit. Physical Exam  Constitutional: He is oriented to person, place, and time. He appears well-developed.  HENT:  Head: Normocephalic.  Eyes: Pupils are equal, round, and reactive to light.  Neck: Normal range of motion.  Cardiovascular: Normal rate.  Respiratory: Effort normal.  GI: Soft.  Musculoskeletal:  Awake, alert, oriented 3. No acute distress. Well-nourished and well-developed. Ambulates with a cane, antalgic gait.  Right knee moderate soft tissue swelling, as well as moderate lower extremity edema, pitting. No calf pain. He is able to passively extend to 0, flexion however is limited to approximately 85 passively and  actively. He reports still quad tightness and pain with this. No instability, no pain or laxity with varus or valgus stress.  Neurological: He is alert and oriented to person, place, and time.  Skin: Skin is warm and dry.    Prior x-rays from the last visit reviewed today with total knee prosthesis in excellent alignment with no signs of osteolysis or loosening.  Assessment/Plan Seven and a half weeks status post right total knee replacement, arthrofibrosis, flexion  contracture  Plan: We discussed treatment options. He has failed to progress with physical therapy, flexion contracture with flexion of 85 passively and actively at this point. This is limiting his progress as well as his function and ability to ambulate properly. Given all of this, we discussed proceeding with right knee exam and manipulation under anesthesia. We discussed the procedure itself as well as risks, complications and alternatives. Patient does desire to proceed with surgery as scheduled for this Friday the 21st. He will restart physical therapy ASAP, Artie has an appointment scheduled here for the 26th, he will need to keep up with home exercises on his own in the interim. He informed us today he is only taking half the dose of Eliquis as he does not want to be on a blood thinner and is hoping to go back on his aspirin. We had advised him to let his PCP know about this. We will hold off on PT this week and save those visits for postoperatively, he can do exercises on his own still to work on motion and strength. Patient was seen in conjunction with Dr. Shelle Carroll today. All questions were answered and he desires to proceed. We will see him back 1 week postop for range of motion recheck.  Plan R knee exam and manip under anesthesia  Dorothy SparkBISSELL,  M., PA-C for Dr. Shelle Carroll 07/18/2017, 2:22 PM

## 2017-07-20 ENCOUNTER — Other Ambulatory Visit: Payer: Self-pay

## 2017-07-20 ENCOUNTER — Encounter (HOSPITAL_BASED_OUTPATIENT_CLINIC_OR_DEPARTMENT_OTHER): Payer: Self-pay | Admitting: *Deleted

## 2017-07-20 NOTE — Progress Notes (Signed)
SPOKE W/ PT VIA PHONE FOR PER-OP INTERVIEW.  NPO AFTER MN W/ EXCEPTION CLEAR LIQUIDS UNTIL 0930 (NO CREAM/MILK PRODUCTS).  NEEDS ISTAT . CURRENT EKG IN CHART.  MAY TAKE PERCOCET IF NEEDED AM DOS W/ SIPS OF WATER.

## 2017-07-22 ENCOUNTER — Ambulatory Visit (HOSPITAL_BASED_OUTPATIENT_CLINIC_OR_DEPARTMENT_OTHER): Payer: BLUE CROSS/BLUE SHIELD | Admitting: Anesthesiology

## 2017-07-22 ENCOUNTER — Encounter (HOSPITAL_BASED_OUTPATIENT_CLINIC_OR_DEPARTMENT_OTHER): Payer: Self-pay | Admitting: *Deleted

## 2017-07-22 ENCOUNTER — Other Ambulatory Visit: Payer: Self-pay

## 2017-07-22 ENCOUNTER — Ambulatory Visit (HOSPITAL_BASED_OUTPATIENT_CLINIC_OR_DEPARTMENT_OTHER)
Admission: RE | Admit: 2017-07-22 | Discharge: 2017-07-22 | Disposition: A | Discharge status: Home / Self Care | Payer: BLUE CROSS/BLUE SHIELD | Source: Ambulatory Visit | Attending: Specialist | Admitting: Specialist

## 2017-07-22 ENCOUNTER — Encounter (HOSPITAL_BASED_OUTPATIENT_CLINIC_OR_DEPARTMENT_OTHER)
Admission: RE | Disposition: A | Discharge status: Home / Self Care | Payer: Self-pay | Source: Ambulatory Visit | Attending: Specialist

## 2017-07-22 DIAGNOSIS — Z96651 Presence of right artificial knee joint: Secondary | ICD-10-CM | POA: Diagnosis not present

## 2017-07-22 DIAGNOSIS — M24561 Contracture, right knee: Secondary | ICD-10-CM | POA: Diagnosis not present

## 2017-07-22 DIAGNOSIS — I10 Essential (primary) hypertension: Secondary | ICD-10-CM | POA: Insufficient documentation

## 2017-07-22 DIAGNOSIS — K219 Gastro-esophageal reflux disease without esophagitis: Secondary | ICD-10-CM | POA: Insufficient documentation

## 2017-07-22 DIAGNOSIS — Z86718 Personal history of other venous thrombosis and embolism: Secondary | ICD-10-CM | POA: Diagnosis not present

## 2017-07-22 DIAGNOSIS — I493 Ventricular premature depolarization: Secondary | ICD-10-CM | POA: Diagnosis not present

## 2017-07-22 DIAGNOSIS — M1990 Unspecified osteoarthritis, unspecified site: Secondary | ICD-10-CM | POA: Insufficient documentation

## 2017-07-22 DIAGNOSIS — G8918 Other acute postprocedural pain: Secondary | ICD-10-CM | POA: Diagnosis not present

## 2017-07-22 DIAGNOSIS — Z7901 Long term (current) use of anticoagulants: Secondary | ICD-10-CM | POA: Diagnosis not present

## 2017-07-22 DIAGNOSIS — M24661 Ankylosis, right knee: Secondary | ICD-10-CM | POA: Diagnosis not present

## 2017-07-22 DIAGNOSIS — Z79899 Other long term (current) drug therapy: Secondary | ICD-10-CM | POA: Diagnosis not present

## 2017-07-22 HISTORY — PX: EXAM UNDER ANESTHESIA WITH MANIPULATION OF KNEE: SHX5816

## 2017-07-22 HISTORY — DX: Fibrosis due to internal orthopedic prosthetic devices, implants and grafts, initial encounter: T84.82XA

## 2017-07-22 HISTORY — DX: Personal history of other venous thrombosis and embolism: Z86.718

## 2017-07-22 HISTORY — DX: Personal history of colon polyps, unspecified: Z86.0100

## 2017-07-22 HISTORY — DX: Contracture, right knee: M24.561

## 2017-07-22 HISTORY — DX: Personal history of colonic polyps: Z86.010

## 2017-07-22 LAB — POCT I-STAT, CHEM 8
BUN: 10 mg/dL (ref 6–20)
CALCIUM ION: 1.24 mmol/L (ref 1.15–1.40)
CHLORIDE: 100 mmol/L — AB (ref 101–111)
CREATININE: 0.7 mg/dL (ref 0.61–1.24)
GLUCOSE: 97 mg/dL (ref 65–99)
HCT: 43 % (ref 39.0–52.0)
Hemoglobin: 14.6 g/dL (ref 13.0–17.0)
POTASSIUM: 3.8 mmol/L (ref 3.5–5.1)
Sodium: 139 mmol/L (ref 135–145)
TCO2: 26 mmol/L (ref 22–32)

## 2017-07-22 SURGERY — MANIPULATION, JOINT, KNEE, WITH ANESTHESIA
Anesthesia: General | Laterality: Right

## 2017-07-22 MED ORDER — OXYCODONE-ACETAMINOPHEN 5-325 MG PO TABS: 1.0000 | ORAL_TABLET | ORAL | 0 refills | Status: DC | PRN

## 2017-07-22 MED ORDER — OXYCODONE HCL 5 MG/5ML PO SOLN
5.0000 mg | Freq: Once | ORAL | Status: DC | PRN
  Filled 2017-07-22: qty 5

## 2017-07-22 MED ORDER — ACETAMINOPHEN 325 MG PO TABS
325.0000 mg | ORAL_TABLET | ORAL | Status: DC | PRN
  Filled 2017-07-22: qty 2

## 2017-07-22 MED ORDER — ROPIVACAINE HCL 7.5 MG/ML IJ SOLN
INTRAMUSCULAR | Status: DC | PRN
  Administered 2017-07-22 (×6): 5 mL via PERINEURAL

## 2017-07-22 MED ORDER — MEPERIDINE HCL 25 MG/ML IJ SOLN
6.2500 mg | INTRAMUSCULAR | Status: DC | PRN
  Filled 2017-07-22: qty 1

## 2017-07-22 MED ORDER — FENTANYL CITRATE (PF) 100 MCG/2ML IJ SOLN
100.0000 ug | Freq: Once | INTRAMUSCULAR | Status: AC
  Administered 2017-07-22: 100 ug via INTRAVENOUS
  Filled 2017-07-22: qty 2

## 2017-07-22 MED ORDER — PROPOFOL 10 MG/ML IV BOLUS
INTRAVENOUS | Status: DC | PRN
  Administered 2017-07-22 (×2): 100 mg via INTRAVENOUS

## 2017-07-22 MED ORDER — FENTANYL CITRATE (PF) 100 MCG/2ML IJ SOLN
INTRAMUSCULAR | Status: AC
  Filled 2017-07-22: qty 2

## 2017-07-22 MED ORDER — LACTATED RINGERS IV SOLN
INTRAVENOUS | Status: DC
  Filled 2017-07-22: qty 1000

## 2017-07-22 MED ORDER — LIDOCAINE 2% (20 MG/ML) 5 ML SYRINGE
INTRAMUSCULAR | Status: AC
Start: 2017-07-22 — End: 2017-07-22
  Filled 2017-07-22: qty 5

## 2017-07-22 MED ORDER — FENTANYL CITRATE (PF) 100 MCG/2ML IJ SOLN
INTRAMUSCULAR | Status: DC | PRN
  Administered 2017-07-22: 50 ug via INTRAVENOUS

## 2017-07-22 MED ORDER — OXYCODONE HCL 5 MG PO TABS
5.0000 mg | ORAL_TABLET | Freq: Once | ORAL | Status: DC | PRN
  Filled 2017-07-22: qty 1

## 2017-07-22 MED ORDER — ACETAMINOPHEN 160 MG/5ML PO SOLN
325.0000 mg | ORAL | Status: DC | PRN
  Filled 2017-07-22: qty 20.3

## 2017-07-22 MED ORDER — KETOROLAC TROMETHAMINE 30 MG/ML IJ SOLN
30.0000 mg | Freq: Once | INTRAMUSCULAR | Status: DC | PRN
  Filled 2017-07-22: qty 1

## 2017-07-22 MED ORDER — MIDAZOLAM HCL 2 MG/2ML IJ SOLN
2.0000 mg | Freq: Once | INTRAMUSCULAR | Status: AC
  Administered 2017-07-22: 2 mg via INTRAVENOUS
  Filled 2017-07-22: qty 2

## 2017-07-22 MED ORDER — LACTATED RINGERS IV SOLN
INTRAVENOUS | Status: DC
Start: 2017-07-22 — End: 2017-07-22
  Administered 2017-07-22 (×2): via INTRAVENOUS
  Filled 2017-07-22: qty 1000

## 2017-07-22 MED ORDER — MIDAZOLAM HCL 2 MG/2ML IJ SOLN
INTRAMUSCULAR | Status: AC
  Filled 2017-07-22: qty 2

## 2017-07-22 MED ORDER — FENTANYL CITRATE (PF) 100 MCG/2ML IJ SOLN
25.0000 ug | INTRAMUSCULAR | Status: DC | PRN
  Filled 2017-07-22: qty 1

## 2017-07-22 MED ORDER — ONDANSETRON HCL 4 MG/2ML IJ SOLN
4.0000 mg | Freq: Once | INTRAMUSCULAR | Status: DC | PRN
  Filled 2017-07-22: qty 2

## 2017-07-22 MED ORDER — LIDOCAINE 2% (20 MG/ML) 5 ML SYRINGE
INTRAMUSCULAR | Status: DC | PRN
Start: 2017-07-22 — End: 2017-07-22
  Administered 2017-07-22: 100 mg via INTRAVENOUS

## 2017-07-22 SURGICAL SUPPLY — 18 items
BANDAGE ADH SHEER 1  50/CT (GAUZE/BANDAGES/DRESSINGS) IMPLANT
CHLORAPREP W/TINT 26ML (MISCELLANEOUS) IMPLANT
CLOTH 2% CHLOROHEXIDINE 3PK (PERSONAL CARE ITEMS) ×1 IMPLANT
COVER SURGICAL LIGHT HANDLE (MISCELLANEOUS) ×1 IMPLANT
GAUZE SPONGE 4X4 12PLY STRL (GAUZE/BANDAGES/DRESSINGS) IMPLANT
GLOVE BIOGEL PI IND STRL 7.0 (GLOVE) ×1 IMPLANT
GLOVE BIOGEL PI IND STRL 8 (GLOVE) ×1 IMPLANT
GLOVE BIOGEL PI INDICATOR 7.0 (GLOVE)
GLOVE BIOGEL PI INDICATOR 8 (GLOVE)
GLOVE SURG SS PI 7.0 STRL IVOR (GLOVE) ×1 IMPLANT
GLOVE SURG SS PI 7.5 STRL IVOR (GLOVE) ×1 IMPLANT
GLOVE SURG SS PI 8.0 STRL IVOR (GLOVE) ×2 IMPLANT
GOWN STRL REUS W/TWL LRG LVL3 (GOWN DISPOSABLE) ×1 IMPLANT
GOWN STRL REUS W/TWL XL LVL3 (GOWN DISPOSABLE) ×2 IMPLANT
MANIFOLD NEPTUNE II (INSTRUMENTS) ×1 IMPLANT
NDL SAFETY ECLIPSE 18X1.5 (NEEDLE) IMPLANT
NEEDLE HYPO 18GX1.5 SHARP (NEEDLE)
SYR CONTROL 10ML LL (SYRINGE) IMPLANT

## 2017-07-22 NOTE — Brief Op Note (Signed)
07/22/2017  4:34 PM  PATIENT:  Langston ReusingAbdallah Abu-Hashem  66 y.o. male  PRE-OPERATIVE DIAGNOSIS:  Flexion contracture right knee, arthrofibrosis, status post total knee arthroplasty  POST-OPERATIVE DIAGNOSIS:  ion contracture right knee, arthrofibrosis, status post total knee arthroplasty  PROCEDURE:  Procedure(s) with comments: EXAM UNDER ANESTHESIA WITH MANIPULATION OF KNEE (Right) - 30 mins  SURGEON:  Surgeon(s) and Role:    * Jene EveryBeane, Juandiego Kolenovic, MD - Primary  PHYSICIAN ASSISTANT:   ASSISTANTS: none   ANESTHESIA:   none  EBL:  None    BLOOD ADMINISTERED:none  DRAINS: none   LOCAL MEDICATIONS USED:  MARCAINE     SPECIMEN:  No Specimen  DISPOSITION OF SPECIMEN:  N/A  COUNTS:  YES  TOURNIQUET:  * No tourniquets in log *  DICTATION: .Other Dictation: Dictation Number [id number]  PLAN OF CARE: Discharge to home after PACU  PATIENT DISPOSITION:  PACU - hemodynamically stable.   Delay start of Pharmacological VTE agent (>24hrs) due to surgical blood loss or risk of bleeding: no

## 2017-07-22 NOTE — Anesthesia Preprocedure Evaluation (Signed)
Anesthesia Evaluation  Patient identified by MRN, date of birth, ID band Patient awake    Reviewed: Allergy & Precautions, NPO status , Patient's Chart, lab work & pertinent test results  Airway Mallampati: I  TM Distance: >3 FB Neck ROM: Full    Dental no notable dental hx. (+) Teeth Intact, Dental Advisory Given   Pulmonary neg pulmonary ROS,    Pulmonary exam normal breath sounds clear to auscultation       Cardiovascular hypertension, Pt. on medications Normal cardiovascular exam+ dysrhythmias  Rhythm:Regular Rate:Bradycardia     Neuro/Psych negative neurological ROS  negative psych ROS   GI/Hepatic hiatal hernia, GERD  Medicated and Controlled,  Endo/Other  negative endocrine ROS  Renal/GU negative Renal ROS  negative genitourinary   Musculoskeletal  (+) Arthritis , Osteoarthritis,  OA left knee   Abdominal (+) + obese,   Peds  Hematology negative hematology ROS (+)   Anesthesia Other Findings   Reproductive/Obstetrics                             Anesthesia Physical  Anesthesia Plan  ASA: II  Anesthesia Plan: General   Post-op Pain Management:  Regional for Post-op pain   Induction: Intravenous  PONV Risk Score and Plan: 3 and Ondansetron, Dexamethasone, Midazolam, Propofol infusion and Treatment may vary due to age or medical condition  Airway Management Planned: LMA and Mask  Additional Equipment:   Intra-op Plan:   Post-operative Plan: Extubation in OR  Informed Consent: I have reviewed the patients History and Physical, chart, labs and discussed the procedure including the risks, benefits and alternatives for the proposed anesthesia with the patient or authorized representative who has indicated his/her understanding and acceptance.     Plan Discussed with: CRNA, Surgeon and Anesthesiologist  Anesthesia Plan Comments: ( )        Anesthesia Quick  Evaluation

## 2017-07-22 NOTE — Anesthesia Procedure Notes (Addendum)
Anesthesia Regional Block: Adductor canal block   Pre-Anesthetic Checklist: ,, timeout performed, Correct Patient, Correct Site, Correct Laterality, Correct Procedure, Correct Position, site marked, Risks and benefits discussed,  Surgical consent,  Pre-op evaluation,  At surgeon's request and post-op pain management  Laterality: Right and Lower  Prep: chloraprep       Needles:  Injection technique: Single-shot  Needle Type: Echogenic Stimulator Needle     Needle Length: 9cm  Needle Gauge: 21   Needle insertion depth: 3 cm   Additional Needles:   Procedures:,,,, ultrasound used (permanent image in chart),,,,  Narrative:  Start time: 07/22/2017 2:51 PM End time: 07/22/2017 3:01 PM Injection made incrementally with aspirations every 5 mL. Anesthesiologist: Leilani AbleHatchett, Keigen Caddell, MD

## 2017-07-22 NOTE — Anesthesia Postprocedure Evaluation (Signed)
Anesthesia Post Note  Patient: Bryan Carroll  Procedure(s) Performed: EXAM UNDER ANESTHESIA WITH MANIPULATION OF KNEE (Right )     Patient location during evaluation: PACU Anesthesia Type: General Level of consciousness: awake, awake and alert and oriented Pain management: pain level controlled Vital Signs Assessment: post-procedure vital signs reviewed and stable Respiratory status: spontaneous breathing, nonlabored ventilation and respiratory function stable Cardiovascular status: blood pressure returned to baseline Anesthetic complications: no    Last Vitals:  Vitals:   07/22/17 1715 07/22/17 1737  BP: (!) 174/87 (!) 161/75  Pulse: 72 69  Resp: (!) 22 20  Temp:    SpO2: 100% 100%    Last Pain:  Vitals:   07/22/17 1346  TempSrc: Oral                 Toa Mia COKER

## 2017-07-22 NOTE — Interval H&P Note (Signed)
History and Physical Interval Note:  07/22/2017 4:17 PM  Bryan Carroll  has presented today for surgery, with the diagnosis of Flexion contracture right knee, arthrofibrosis, status post total knee arthroplasty  The various methods of treatment have been discussed with the patient and family. After consideration of risks, benefits and other options for treatment, the patient has consented to  Procedure(s) with comments: EXAM UNDER ANESTHESIA WITH MANIPULATION OF KNEE (Right) - 30 mins as a surgical intervention .  [name] history has been reviewed, patient examined, no change in status, stable for surgery.  I have reviewed the patient's chart and labs.  Questions were answered to the patient's satisfaction.     [name] C

## 2017-07-22 NOTE — Transfer of Care (Signed)
Immediate Anesthesia Transfer of Care Note  Patient: Bryan Carroll  Procedure(s) Performed: Procedure(s) (LRB): EXAM UNDER ANESTHESIA WITH MANIPULATION OF KNEE (Right)  Patient Location: PACU  Anesthesia Type: General  Level of Consciousness: awake, oriented, sedated and patient cooperative  Airway & Oxygen Therapy: Patient Spontanous Breathing and Patient connected to face mask oxygen  Post-op Assessment: Report given to PACU RN and Post -op Vital signs reviewed and stable  Post vital signs: Reviewed and stable  Complications: No apparent anesthesia complications Last Vitals:  Vitals:   07/22/17 1510 07/22/17 1644  BP: (!) 168/92   Pulse:  (P) 72  Resp:    Temp:  (P) 36.4 C  SpO2:  (P) 100%    Last Pain:  Vitals:   07/22/17 1346  TempSrc: Oral      Patients Stated Pain Goal: 4 (07/22/17 1403)

## 2017-07-22 NOTE — Anesthesia Postprocedure Evaluation (Signed)
Anesthesia Post Note  Patient: Bryan Carroll  Procedure(s) Performed: EXAM UNDER ANESTHESIA WITH MANIPULATION OF KNEE (Right )     Patient location during evaluation: PACU Anesthesia Type: General Level of consciousness: awake Pain management: pain level controlled Vital Signs Assessment: post-procedure vital signs reviewed and stable Respiratory status: spontaneous breathing Cardiovascular status: stable Postop Assessment: no apparent nausea or vomiting Anesthetic complications: no    Last Vitals:  Vitals:   07/22/17 1715 07/22/17 1737  BP: (!) 174/87 (!) 161/75  Pulse: 72 69  Resp: (!) 22 20  Temp:    SpO2: 100% 100%    Last Pain:  Vitals:   07/22/17 1346  TempSrc: Oral   Pain Goal: Patients Stated Pain Goal: 4 (07/22/17 1403)               [name],[name]

## 2017-07-22 NOTE — Addendum Note (Signed)
Addendum  created 07/22/17 1918 by Kipp BroodJoslin, Feliz Lincoln, MD   Intraprocedure Attestations filed, Sign clinical note

## 2017-07-22 NOTE — Discharge Instructions (Signed)
ARTHROSCOPIC KNEE SURGERY HOME CARE INSTRUCTIONS ° ° °PAIN °You will be expected to have a moderate amount of pain in the affected knee for approximately two weeks.  However, the first two to four days will be the most severe in terms of the pain you will experience.  Prescriptions have been provided for you to take as needed for the pain.  The pain can be markedly reduced by using the ice/compressive bandage given.  Exchange the ice packs whenever they thaw.  During the night, keep the bandage on because it will still provide some compression for the swelling.  Also, keep the leg elevated on pillows above your heart, and this will help alleviate the pain and swelling. ° °MEDICATION °Prescriptions have been provided to take as needed for pain. To prevent blood clots, take Aspirin 325mg daily with a meal if not on a blood thinner and if no history of stomach ulcers. ° °ACTIVITY °It is preferred that you stay on bedrest for approximately 24 hours.  However, you may go to the bathroom with help.  After this, you can start to be up and about progressively more.  Remember that the swelling may still increase after three to four days if you are up and doing too much.  You may put as much weight on the affected leg as pain will allow.  Use your crutches for comfort and safety.  However, as soon as you are able, you may discard the crutches and go without them.  ° °DRESSING °Keep the current dressing as dry as possible.  Two days after your surgery, you may remove the ice/compressive wrap, and surgical dressing.  You may now take a shower, but do not scrub the sounds directly with soap.  Let water rinse over these and gently wipe with your hand.  Reapply band-aids over the puncture wounds and more gauze if needed.  A slight amount of thin drainage can be normal at this time, and do not let it frighten you.  Reapply the ice/compressive wrap.  You may now repeat this every day each time you shower. ° °SYMPTOMS TO REPORT TO  YOUR DOCTOR ° -Extreme pain. ° -Extreme swelling. ° -Temperature above 101 degrees that does not come down with acetaminophen     (Tylenol). ° -Any changes in the feeling, color or movement of your toes. ° -Extreme redness, heat, swelling or drainage at your incision ° °EXERCISE °It is preferred that you begin to exercise on the day of your surgery.  Straight leg raises and short arc quads should be begun the afternoon or evening of surgery and continued until you come back for your follow-up appointment.   Attached is an instruction sheet on how to perform these two simple exercises.  Do these at least three times per day if not more.  You may bend your knee as much as is comfortable.  The puncture wounds may occasionally be slightly uncomfortable with bending of the knee.  Do not let this frighten you.  It is important to keep your knee motion, but do not overdo it.  If you have significant pain, simply do not bend the knee as far.   You will be given more exercises to perform at your first return visit.   ° °RETURN APPOINTMENT °Please make an appointment to be seen by your doctor in 14 days from your surgery. ° ° °Post Anesthesia Home Care Instructions ° °Activity: °Get plenty of rest for the remainder of the day. A responsible individual   must stay with you for 24 hours following the procedure.  For the next 24 hours, DO NOT: -Drive a car -Advertising copywriterperate machinery -Drink alcoholic beverages -Take any medication unless instructed by your physician -Make any legal decisions or sign important papers.  Meals: Start with liquid foods such as gelatin or soup. Progress to regular foods as tolerated. Avoid greasy, spicy, heavy foods. If nausea and/or vomiting occur, drink only clear liquids until the nausea and/or vomiting subsides. Call your physician if vomiting continues.  Special Instructions/Symptoms: Your throat may feel dry or sore from the anesthesia or the breathing tube placed in your throat during  surgery. If this causes discomfort, gargle with warm salt water. The discomfort should disappear within 24 hours.

## 2017-07-27 ENCOUNTER — Encounter (HOSPITAL_BASED_OUTPATIENT_CLINIC_OR_DEPARTMENT_OTHER): Payer: Self-pay | Admitting: Specialist

## 2017-07-27 DIAGNOSIS — M25661 Stiffness of right knee, not elsewhere classified: Secondary | ICD-10-CM | POA: Diagnosis not present

## 2017-07-27 NOTE — Op Note (Signed)
NAME:  Langston ReusingBU-HASHEM, Neco              ACCOUNT NO.:  MEDICAL RECORD NO.:  [id number]  LOCATION:                                 FACILITY:  PHYSICIAN:  Jene EveryJeffrey Daziyah Cogan, M.D.    DATE OF BIRTH:  Dec 23, 1950  DATE OF PROCEDURE:  07/22/2017 DATE OF DISCHARGE:                              OPERATIVE REPORT   PREOPERATIVE DIAGNOSIS:  Arthrofibrosis, status post total knee replacement.  POSTOPERATIVE DIAGNOSIS:  Arthrofibrosis, status post total knee replacement.  PROCEDURE PERFORMED:  Manipulation under anesthesia, right knee.  ANESTHESIA:  General.  ASSISTANT:  None.  HISTORY:  A 66 year old, status post knee replacement, had 75 degrees of flexion maximal at 6 weeks, 7 weeks, not making progress, indicated for manipulation.  He also had DVT, on Eliquis.  We discussed the manipulation under anesthesia including risks and benefits of bleeding, infection, fracture, inability to obtain additional motion, etc.  TECHNIQUE:  With the patient in supine position after induction of adequate general anesthesia and relaxation, he was ranged to 75 degrees. I applied gentle flexion pressure to the knee with the knee flexed grabbing approximately of the tibia and had some appreciation of gentle lysis of adhesions.  I was able to obtain 100 degrees of flexion only. Extension, he had -3 degrees.  We applied an extension pressure as well, did not affect extension.  This was applied over a 5-minute period of time twice to the knee and the maximal flexion was 100 degrees.  Following this, I felt that it was the maximum that we could receive with manipulation.  I placed him in a knee immobilizer, woken him from anesthesia, transported to the recovery room in satisfactory condition. The patient tolerated the procedure well, no complication, no fracture appreciated.     Jene EveryJeffrey Sai Moura, M.D.     Cordelia PenJB/MEDQ  D:  07/22/2017  T:  07/23/2017  Job:  [id number]

## 2017-07-28 DIAGNOSIS — M25661 Stiffness of right knee, not elsewhere classified: Secondary | ICD-10-CM | POA: Diagnosis not present

## 2017-07-29 DIAGNOSIS — M25661 Stiffness of right knee, not elsewhere classified: Secondary | ICD-10-CM | POA: Diagnosis not present

## 2017-08-01 DIAGNOSIS — M25661 Stiffness of right knee, not elsewhere classified: Secondary | ICD-10-CM | POA: Diagnosis not present

## 2017-08-03 DIAGNOSIS — M25661 Stiffness of right knee, not elsewhere classified: Secondary | ICD-10-CM | POA: Diagnosis not present

## 2017-08-04 DIAGNOSIS — M25661 Stiffness of right knee, not elsewhere classified: Secondary | ICD-10-CM | POA: Diagnosis not present

## 2017-08-05 DIAGNOSIS — M25661 Stiffness of right knee, not elsewhere classified: Secondary | ICD-10-CM | POA: Diagnosis not present

## 2017-08-09 DIAGNOSIS — M25661 Stiffness of right knee, not elsewhere classified: Secondary | ICD-10-CM | POA: Diagnosis not present

## 2017-08-12 DIAGNOSIS — M25661 Stiffness of right knee, not elsewhere classified: Secondary | ICD-10-CM | POA: Diagnosis not present

## 2017-08-15 DIAGNOSIS — M25661 Stiffness of right knee, not elsewhere classified: Secondary | ICD-10-CM | POA: Diagnosis not present

## 2017-08-15 DIAGNOSIS — R310 Gross hematuria: Secondary | ICD-10-CM | POA: Diagnosis not present

## 2017-08-17 DIAGNOSIS — M25661 Stiffness of right knee, not elsewhere classified: Secondary | ICD-10-CM | POA: Diagnosis not present

## 2017-08-19 DIAGNOSIS — M25661 Stiffness of right knee, not elsewhere classified: Secondary | ICD-10-CM | POA: Diagnosis not present

## 2017-08-23 DIAGNOSIS — M25661 Stiffness of right knee, not elsewhere classified: Secondary | ICD-10-CM | POA: Diagnosis not present

## 2017-08-26 DIAGNOSIS — M25661 Stiffness of right knee, not elsewhere classified: Secondary | ICD-10-CM | POA: Diagnosis not present

## 2017-08-29 DIAGNOSIS — M25661 Stiffness of right knee, not elsewhere classified: Secondary | ICD-10-CM | POA: Diagnosis not present

## 2017-08-31 DIAGNOSIS — M25661 Stiffness of right knee, not elsewhere classified: Secondary | ICD-10-CM | POA: Diagnosis not present

## 2017-09-02 DIAGNOSIS — M25661 Stiffness of right knee, not elsewhere classified: Secondary | ICD-10-CM | POA: Diagnosis not present

## 2017-09-05 DIAGNOSIS — M25661 Stiffness of right knee, not elsewhere classified: Secondary | ICD-10-CM | POA: Diagnosis not present

## 2017-09-07 DIAGNOSIS — M25661 Stiffness of right knee, not elsewhere classified: Secondary | ICD-10-CM | POA: Diagnosis not present

## 2017-09-08 DIAGNOSIS — Z4889 Encounter for other specified surgical aftercare: Secondary | ICD-10-CM | POA: Diagnosis not present

## 2017-09-08 DIAGNOSIS — Z96651 Presence of right artificial knee joint: Secondary | ICD-10-CM | POA: Diagnosis not present

## 2017-09-08 DIAGNOSIS — M75101 Unspecified rotator cuff tear or rupture of right shoulder, not specified as traumatic: Secondary | ICD-10-CM | POA: Diagnosis not present

## 2017-09-08 DIAGNOSIS — M7541 Impingement syndrome of right shoulder: Secondary | ICD-10-CM | POA: Diagnosis not present

## 2017-09-12 DIAGNOSIS — M25661 Stiffness of right knee, not elsewhere classified: Secondary | ICD-10-CM | POA: Diagnosis not present

## 2017-09-14 DIAGNOSIS — M25661 Stiffness of right knee, not elsewhere classified: Secondary | ICD-10-CM | POA: Diagnosis not present

## 2017-09-16 DIAGNOSIS — M25661 Stiffness of right knee, not elsewhere classified: Secondary | ICD-10-CM | POA: Diagnosis not present

## 2017-09-19 DIAGNOSIS — M25661 Stiffness of right knee, not elsewhere classified: Secondary | ICD-10-CM | POA: Diagnosis not present

## 2017-09-21 DIAGNOSIS — M25661 Stiffness of right knee, not elsewhere classified: Secondary | ICD-10-CM | POA: Diagnosis not present

## 2017-09-23 DIAGNOSIS — M25661 Stiffness of right knee, not elsewhere classified: Secondary | ICD-10-CM | POA: Diagnosis not present

## 2017-11-25 DIAGNOSIS — E785 Hyperlipidemia, unspecified: Secondary | ICD-10-CM | POA: Diagnosis not present

## 2017-11-25 DIAGNOSIS — Z125 Encounter for screening for malignant neoplasm of prostate: Secondary | ICD-10-CM | POA: Diagnosis not present

## 2017-11-25 DIAGNOSIS — Z0000 Encounter for general adult medical examination without abnormal findings: Secondary | ICD-10-CM | POA: Diagnosis not present

## 2017-11-25 DIAGNOSIS — R6882 Decreased libido: Secondary | ICD-10-CM | POA: Diagnosis not present

## 2017-11-25 DIAGNOSIS — R310 Gross hematuria: Secondary | ICD-10-CM | POA: Diagnosis not present

## 2017-11-25 DIAGNOSIS — I10 Essential (primary) hypertension: Secondary | ICD-10-CM | POA: Diagnosis not present

## 2017-11-25 DIAGNOSIS — I82409 Acute embolism and thrombosis of unspecified deep veins of unspecified lower extremity: Secondary | ICD-10-CM | POA: Diagnosis not present

## 2017-12-06 DIAGNOSIS — Z4889 Encounter for other specified surgical aftercare: Secondary | ICD-10-CM | POA: Diagnosis not present

## 2017-12-06 DIAGNOSIS — Z96651 Presence of right artificial knee joint: Secondary | ICD-10-CM | POA: Diagnosis not present

## 2018-07-17 DIAGNOSIS — Z23 Encounter for immunization: Secondary | ICD-10-CM | POA: Diagnosis not present

## 2018-07-17 DIAGNOSIS — S60552A Superficial foreign body of left hand, initial encounter: Secondary | ICD-10-CM | POA: Diagnosis not present

## 2018-07-17 DIAGNOSIS — L819 Disorder of pigmentation, unspecified: Secondary | ICD-10-CM | POA: Diagnosis not present

## 2018-07-17 DIAGNOSIS — L0390 Cellulitis, unspecified: Secondary | ICD-10-CM | POA: Diagnosis not present

## 2018-08-28 DIAGNOSIS — D122 Benign neoplasm of ascending colon: Secondary | ICD-10-CM | POA: Diagnosis not present

## 2018-08-28 DIAGNOSIS — D125 Benign neoplasm of sigmoid colon: Secondary | ICD-10-CM | POA: Diagnosis not present

## 2018-08-28 DIAGNOSIS — Z86010 Personal history of colonic polyps: Secondary | ICD-10-CM | POA: Diagnosis not present

## 2018-08-30 DIAGNOSIS — D122 Benign neoplasm of ascending colon: Secondary | ICD-10-CM | POA: Diagnosis not present

## 2018-08-30 DIAGNOSIS — D125 Benign neoplasm of sigmoid colon: Secondary | ICD-10-CM | POA: Diagnosis not present

## 2018-09-06 DIAGNOSIS — L820 Inflamed seborrheic keratosis: Secondary | ICD-10-CM | POA: Diagnosis not present

## 2018-09-06 DIAGNOSIS — L821 Other seborrheic keratosis: Secondary | ICD-10-CM | POA: Diagnosis not present

## 2018-09-06 DIAGNOSIS — D2239 Melanocytic nevi of other parts of face: Secondary | ICD-10-CM | POA: Diagnosis not present

## 2018-09-13 DIAGNOSIS — H2513 Age-related nuclear cataract, bilateral: Secondary | ICD-10-CM | POA: Diagnosis not present

## 2018-09-13 DIAGNOSIS — H52223 Regular astigmatism, bilateral: Secondary | ICD-10-CM | POA: Diagnosis not present

## 2018-09-13 DIAGNOSIS — H524 Presbyopia: Secondary | ICD-10-CM | POA: Diagnosis not present

## 2018-09-13 DIAGNOSIS — H5203 Hypermetropia, bilateral: Secondary | ICD-10-CM | POA: Diagnosis not present

## 2018-12-28 DIAGNOSIS — Z0000 Encounter for general adult medical examination without abnormal findings: Secondary | ICD-10-CM | POA: Diagnosis not present

## 2018-12-28 DIAGNOSIS — E785 Hyperlipidemia, unspecified: Secondary | ICD-10-CM | POA: Diagnosis not present

## 2018-12-28 DIAGNOSIS — Z125 Encounter for screening for malignant neoplasm of prostate: Secondary | ICD-10-CM | POA: Diagnosis not present

## 2018-12-28 DIAGNOSIS — R6882 Decreased libido: Secondary | ICD-10-CM | POA: Diagnosis not present

## 2018-12-29 DIAGNOSIS — Z0000 Encounter for general adult medical examination without abnormal findings: Secondary | ICD-10-CM | POA: Diagnosis not present

## 2018-12-29 DIAGNOSIS — R6882 Decreased libido: Secondary | ICD-10-CM | POA: Diagnosis not present

## 2018-12-29 DIAGNOSIS — E785 Hyperlipidemia, unspecified: Secondary | ICD-10-CM | POA: Diagnosis not present

## 2018-12-29 DIAGNOSIS — I10 Essential (primary) hypertension: Secondary | ICD-10-CM | POA: Diagnosis not present

## 2019-05-02 DIAGNOSIS — Z23 Encounter for immunization: Secondary | ICD-10-CM | POA: Diagnosis not present

## 2019-06-01 DIAGNOSIS — M7989 Other specified soft tissue disorders: Secondary | ICD-10-CM | POA: Diagnosis not present

## 2019-06-01 DIAGNOSIS — M79671 Pain in right foot: Secondary | ICD-10-CM | POA: Diagnosis not present

## 2019-06-25 DIAGNOSIS — M25571 Pain in right ankle and joints of right foot: Secondary | ICD-10-CM | POA: Diagnosis not present

## 2019-06-25 DIAGNOSIS — Z96651 Presence of right artificial knee joint: Secondary | ICD-10-CM | POA: Diagnosis not present

## 2019-06-25 DIAGNOSIS — Z471 Aftercare following joint replacement surgery: Secondary | ICD-10-CM | POA: Diagnosis not present

## 2019-07-31 DIAGNOSIS — L309 Dermatitis, unspecified: Secondary | ICD-10-CM | POA: Diagnosis not present

## 2019-09-09 ENCOUNTER — Ambulatory Visit: Payer: Medicare Other | Attending: Internal Medicine

## 2019-09-09 DIAGNOSIS — Z23 Encounter for immunization: Secondary | ICD-10-CM | POA: Insufficient documentation

## 2019-09-09 NOTE — Progress Notes (Signed)
   Covid-19 Vaccination Clinic  Name:  Bryan Carroll    MRN: [id number] DOB: [birth year]  09/09/2019  Mr. Bryan Carroll was observed post Covid-19 immunization for 15 minutes without incidence. He was provided with Vaccine Information Sheet and instruction to access the V-Safe system.   Bryan Carroll was instructed to call 911 with any severe reactions post vaccine: Marland Kitchen Difficulty breathing  . [name]  . [name]  . [name]  . Dizziness and weakness    Immunizations Administered    Name Date Dose VIS Date Route   Pfizer COVID-19 Vaccine 09/09/2019  4:33 PM 0.3 mL 07/13/2019 Intramuscular   Manufacturer: [employer], [employer]   Lot: [id number]   NDC: [address]-1

## 2019-10-04 ENCOUNTER — Ambulatory Visit: Payer: Medicare Other | Attending: Internal Medicine

## 2019-10-04 DIAGNOSIS — Z23 Encounter for immunization: Secondary | ICD-10-CM | POA: Insufficient documentation

## 2019-10-04 NOTE — Progress Notes (Signed)
   Covid-19 Vaccination Clinic  Name:  IZEK CORVINO    MRN: [id number] DOB: [birth year]  10/04/2019  Mr. Trainer was observed post Covid-19 immunization for 15 minutes without incident. He was provided with Vaccine Information Sheet and instruction to access the V-Safe system.   Mr. Haithcock was instructed to call 911 with any severe reactions post vaccine: Marland Kitchen Difficulty breathing  . Swelling of face and throat  . [name]  . A bad rash all over body  . Dizziness and weakness   Immunizations Administered    Name Date Dose VIS Date Route   Pfizer COVID-19 Vaccine 10/04/2019  2:35 PM 0.3 mL 07/13/2019 Intramuscular   Manufacturer: [employer], [employer]   Lot: [id number]   NDC: [address]-5

## 2019-11-14 DIAGNOSIS — R7309 Other abnormal glucose: Secondary | ICD-10-CM | POA: Diagnosis not present

## 2019-11-14 DIAGNOSIS — I10 Essential (primary) hypertension: Secondary | ICD-10-CM | POA: Diagnosis not present

## 2019-11-14 DIAGNOSIS — E785 Hyperlipidemia, unspecified: Secondary | ICD-10-CM | POA: Diagnosis not present

## 2019-11-14 DIAGNOSIS — N529 Male erectile dysfunction, unspecified: Secondary | ICD-10-CM | POA: Diagnosis not present

## 2019-11-14 DIAGNOSIS — Z125 Encounter for screening for malignant neoplasm of prostate: Secondary | ICD-10-CM | POA: Diagnosis not present

## 2020-01-02 DIAGNOSIS — Z23 Encounter for immunization: Secondary | ICD-10-CM | POA: Diagnosis not present

## 2020-01-02 DIAGNOSIS — Z0000 Encounter for general adult medical examination without abnormal findings: Secondary | ICD-10-CM | POA: Diagnosis not present

## 2020-01-02 DIAGNOSIS — I10 Essential (primary) hypertension: Secondary | ICD-10-CM | POA: Diagnosis not present

## 2020-01-02 DIAGNOSIS — E785 Hyperlipidemia, unspecified: Secondary | ICD-10-CM | POA: Diagnosis not present

## 2020-03-14 DIAGNOSIS — J988 Other specified respiratory disorders: Secondary | ICD-10-CM | POA: Diagnosis not present

## 2020-12-10 DIAGNOSIS — R21 Rash and other nonspecific skin eruption: Secondary | ICD-10-CM | POA: Diagnosis not present

## 2020-12-10 DIAGNOSIS — I10 Essential (primary) hypertension: Secondary | ICD-10-CM | POA: Diagnosis not present

## 2020-12-10 DIAGNOSIS — E785 Hyperlipidemia, unspecified: Secondary | ICD-10-CM | POA: Diagnosis not present

## 2020-12-10 DIAGNOSIS — M1000 Idiopathic gout, unspecified site: Secondary | ICD-10-CM | POA: Diagnosis not present

## 2020-12-12 DIAGNOSIS — Z125 Encounter for screening for malignant neoplasm of prostate: Secondary | ICD-10-CM | POA: Diagnosis not present

## 2020-12-12 DIAGNOSIS — M1000 Idiopathic gout, unspecified site: Secondary | ICD-10-CM | POA: Diagnosis not present

## 2020-12-12 DIAGNOSIS — E785 Hyperlipidemia, unspecified: Secondary | ICD-10-CM | POA: Diagnosis not present

## 2020-12-12 DIAGNOSIS — R6882 Decreased libido: Secondary | ICD-10-CM | POA: Diagnosis not present

## 2021-04-28 DIAGNOSIS — Z23 Encounter for immunization: Secondary | ICD-10-CM | POA: Diagnosis not present

## 2021-07-07 DIAGNOSIS — Z0000 Encounter for general adult medical examination without abnormal findings: Secondary | ICD-10-CM | POA: Diagnosis not present

## 2021-07-07 DIAGNOSIS — R6882 Decreased libido: Secondary | ICD-10-CM | POA: Diagnosis not present

## 2021-07-07 DIAGNOSIS — Z125 Encounter for screening for malignant neoplasm of prostate: Secondary | ICD-10-CM | POA: Diagnosis not present

## 2021-07-07 DIAGNOSIS — Z23 Encounter for immunization: Secondary | ICD-10-CM | POA: Diagnosis not present

## 2021-07-07 DIAGNOSIS — E785 Hyperlipidemia, unspecified: Secondary | ICD-10-CM | POA: Diagnosis not present

## 2021-07-07 DIAGNOSIS — I10 Essential (primary) hypertension: Secondary | ICD-10-CM | POA: Diagnosis not present

## 2021-07-07 DIAGNOSIS — R7309 Other abnormal glucose: Secondary | ICD-10-CM | POA: Diagnosis not present

## 2021-07-13 ENCOUNTER — Other Ambulatory Visit: Payer: Self-pay | Admitting: Family Medicine

## 2021-07-13 DIAGNOSIS — Z136 Encounter for screening for cardiovascular disorders: Secondary | ICD-10-CM

## 2021-07-21 DIAGNOSIS — L821 Other seborrheic keratosis: Secondary | ICD-10-CM | POA: Diagnosis not present

## 2021-07-21 DIAGNOSIS — D2239 Melanocytic nevi of other parts of face: Secondary | ICD-10-CM | POA: Diagnosis not present

## 2021-08-06 ENCOUNTER — Ambulatory Visit
Admission: RE | Admit: 2021-08-06 | Discharge: 2021-08-06 | Disposition: A | Discharge status: Home / Self Care | Payer: No Typology Code available for payment source | Source: Ambulatory Visit | Attending: Family Medicine | Admitting: Family Medicine

## 2021-08-06 DIAGNOSIS — J029 Acute pharyngitis, unspecified: Secondary | ICD-10-CM | POA: Diagnosis not present

## 2021-08-06 DIAGNOSIS — Z03818 Encounter for observation for suspected exposure to other biological agents ruled out: Secondary | ICD-10-CM | POA: Diagnosis not present

## 2021-08-06 DIAGNOSIS — Z136 Encounter for screening for cardiovascular disorders: Secondary | ICD-10-CM

## 2022-01-05 DIAGNOSIS — I10 Essential (primary) hypertension: Secondary | ICD-10-CM | POA: Diagnosis not present

## 2022-01-05 DIAGNOSIS — E785 Hyperlipidemia, unspecified: Secondary | ICD-10-CM | POA: Diagnosis not present

## 2022-01-05 DIAGNOSIS — K760 Fatty (change of) liver, not elsewhere classified: Secondary | ICD-10-CM | POA: Diagnosis not present

## 2022-01-05 DIAGNOSIS — I2510 Atherosclerotic heart disease of native coronary artery without angina pectoris: Secondary | ICD-10-CM | POA: Diagnosis not present

## 2022-01-06 ENCOUNTER — Telehealth: Payer: Self-pay

## 2022-01-06 NOTE — Telephone Encounter (Signed)
NOTES SCANNED TO REFERRAL 

## 2022-01-14 ENCOUNTER — Ambulatory Visit: Payer: Medicare Other | Admitting: Internal Medicine

## 2022-01-14 ENCOUNTER — Encounter: Payer: Self-pay | Admitting: Internal Medicine

## 2022-01-14 VITALS — BP 132/70 | HR 61 | Ht 71.0 in | Wt 219.4 lb

## 2022-01-14 DIAGNOSIS — I2510 Atherosclerotic heart disease of native coronary artery without angina pectoris: Secondary | ICD-10-CM | POA: Diagnosis not present

## 2022-01-14 MED ORDER — ASPIRIN 81 MG PO TBEC: 81.0000 mg | DELAYED_RELEASE_TABLET | Freq: Every day | ORAL | 3 refills | Status: DC

## 2022-01-14 NOTE — Progress Notes (Signed)
Cardiology Office Note:    Date:  01/14/2022   ID:  Bryan Carroll, DOB 10-25-1950, MRN [id number]  PCP:  Farris Has, MD   [facility] Providers Cardiologist:  Maisie Fus, MD     Referring MD: Farris Has, MD   No chief complaint on file. Elevated CAC  History of Present Illness:    Bryan Carroll is a 71 y.o. male with a hx of GERD, LE DVT managed with eiliquis 2018 ( he noted bleeding, now off) , HTN, R TKA, was referred for CAC. He had prior stress in the 1990s with Dr. Georgann Housekeeper. He saw  Dr. Tenny Craw for PVCs with notable caffeine intake. A holter was recommended and it showed rare PVCs. He also had ABIs that were normal.  Referral for CAC , he was 93rd percentile. He is a non smoker. He was started on atorvastatin 10 mg daily.  He walks up and down steps at work.  He denies SOB or chest pressure. No cigarette smoking. No premature CAD family hx.   Cardiology Studies: CAC 08/06/2021 FINDINGS: CORONARY CALCIUM SCORES: Left Main: 117 LAD: 219 LCx: 94 RCA: 201  Total Agatston Score: 631 MESA database percentile: 93rd AORTA MEASUREMENTS: Ascending Aorta: 35 mm Descending Aorta: 30 mm    Past Medical History:  Diagnosis Date   Arthrofibrosis of total knee arthroplasty (HCC)    right   Flexion contracture of knee, right    post op total knee 05-26-2017   GERD (gastroesophageal reflux disease)    History of colon polyps    History of DVT of lower extremity 06/13/2017   ED visit --  dvt right lower leg post op right total knee   Hypertension    Palpitations    per cardiologist note, dr Dietrich Pates, 2015--  24hr monitor show SB to ST w/  rare PVCs and rare PACs    Past Surgical History:  Procedure Laterality Date   EXAM UNDER ANESTHESIA WITH MANIPULATION OF KNEE Right 07/22/2017   Procedure: EXAM UNDER ANESTHESIA WITH MANIPULATION OF KNEE;  Surgeon: Jene Every, MD;  Location: [facility];  Service: Orthopedics;  Laterality: Right;  30  mins   INGUINAL HERNIA REPAIR Right 2003  approx.   TOTAL KNEE ARTHROPLASTY Right 05/26/2017   Procedure: RIGHT TOTAL KNEE ARTHROPLASTY;  Surgeon: Jene Every, MD;  Location: [facility];  Service: Orthopedics;  Laterality: Right;  120 mins    Current Medications: Current Meds  Medication Sig   aspirin EC 81 MG tablet Take 1 tablet (81 mg total) by mouth daily. Swallow whole.   atorvastatin (LIPITOR) 10 MG tablet Take 10 mg by mouth daily.   lisinopril (PRINIVIL,ZESTRIL) 20 MG tablet Take 20 mg by mouth every morning.    oxyCODONE-acetaminophen (PERCOCET) 5-325 MG tablet Take 1-2 tablets by mouth every 4 (four) hours as needed for severe pain.   [DISCONTINUED] apixaban (ELIQUIS) 5 MG TABS tablet Take 5 mg by mouth 2 (two) times daily.     Allergies:   Patient has no allergy information on record.   Social History   Socioeconomic History   Marital status: Married    Spouse name: Not on file   Number of children: Not on file   Years of education: Not on file   Highest education level: Not on file  Occupational History   Not on file  Tobacco Use   Smoking status: Never   Smokeless tobacco: Never  Vaping Use   Vaping Use: Some days  Substance and Sexual Activity   Alcohol use: Yes    Alcohol/week: 1.0 - 2.0 standard drink of alcohol    Types: 1 - 2 Glasses of wine per week    Comment: occasionally    Drug use: No   Sexual activity: Not on file  Other Topics Concern   Not on file  Social History Narrative   Not on file   Social Determinants of Health   Financial Resource Strain: Not on file  Food Insecurity: Not on file  Transportation Needs: Not on file  Physical Activity: Not on file  Stress: Not on file  Social Connections: Not on file     Family History: [name] family history includes Heart disease in his brother and brother; Hypertension in his mother.  ROS:   Please see the history of present illness.     All other systems reviewed and are  negative.  EKGs/Labs/Other Studies Reviewed:    The following studies were reviewed today:   EKG:  EKG is  ordered today.  The ekg ordered today demonstrates   NSR Q in inferior leads, similar pattern seen prior 2015.  Recent Labs: No results found for requested labs within last 365 days.   Recent Lipid Panel No results found for: "CHOL", "TRIG", "HDL", "CHOLHDL", "VLDL", "LDLCALC", "LDLDIRECT"   Risk Assessment/Calculations:           Physical Exam:    VS:    Vitals:   01/14/22 0941  BP: 132/70  Pulse: 61  SpO2: 96%     Wt Readings from Last 3 Encounters:  01/14/22 219 lb 6.4 oz (99.5 kg)  07/22/17 200 lb 3.2 oz (90.8 kg)  06/13/17 220 lb (99.8 kg)     GEN:  Well nourished, well developed in no acute distress HEENT: Normal NECK: No JVD; No carotid bruits LYMPHATICS: No lymphadenopathy CARDIAC: RRR, no murmurs, rubs, gallops RESPIRATORY:  Clear to auscultation without rales, wheezing or rhonchi  ABDOMEN: Soft, non-tender, non-distended MUSCULOSKELETAL:  No edema; No deformity  SKIN: Warm and dry NEUROLOGIC:  Alert and oriented x 3 PSYCHIATRIC:  Normal affect   ASSESSMENT:    CAC: CAC is elevated. He is asymptomatic. Will plan for CVD risk mitigation. Can restart aspirin 81 mg daily for CAD  HTN: continue lisinopril 20 mg daily. Goal BP < 130/80 mmhg  HLD: continue atorvastatin 10 mg. Goal LDL < 70 mg/dL; He prefers to follow his lipid levels with his PCP. Can uptitrate lipitor and zetia if LDL goal is not achieved.   PLAN:    In order of problems listed above:  Follow up as needed       Medication Adjustments/Labs and Tests Ordered: Current medicines are reviewed at length with the patient today.  Concerns regarding medicines are outlined above.  Orders Placed This Encounter  Procedures   EKG 12-Lead   Meds ordered this encounter  Medications   aspirin EC 81 MG tablet    Sig: Take 1 tablet (81 mg total) by mouth daily. Swallow whole.     Dispense:  90 tablet    Refill:  3    Patient Instructions  Medication Instructions:  START: ASPIRIN 81mg  ONCE DAILY *If you need a refill on your cardiac medications before your next appointment, please call your pharmacy*  Lab Work: None Ordered At This Time.  If you have labs (blood work) drawn today and your tests are completely normal, you will receive your results only by: MyChart Message (if you have MyChart)  OR A paper copy in the mail If you have any lab test that is abnormal or we need to change your treatment, we will call you to review the results.  Testing/Procedures: None Ordered At This Time.   Follow-Up: At [facility], you and your health needs are our priority.  As part of our continuing mission to provide you with exceptional heart care, we have created designated Provider Care Teams.  These Care Teams include your primary Cardiologist (physician) and Advanced Practice Providers (APPs -  Physician Assistants and Nurse Practitioners) who all work together to provide you with the care you need, when you need it.  We recommend signing up for the patient portal called "MyChart".  Sign up information is provided on this After Visit Summary.  MyChart is used to connect with patients for Virtual Visits (Telemedicine).  Patients are able to view lab/test results, encounter notes, upcoming appointments, etc.  Non-urgent messages can be sent to your provider as well.   To learn more about what you can do with MyChart, go to [email or web address].    Your next appointment:   AS NEEDED   The format for your next appointment:   In Person  Provider:   Maisie Fus, MD           Signed, Maisie Fus, MD  01/14/2022 10:12 AM    [facility]

## 2022-01-14 NOTE — Patient Instructions (Signed)
Medication Instructions:  START: ASPIRIN 81mg  ONCE DAILY *If you need a refill on your cardiac medications before your next appointment, please call your pharmacy*  Lab Work: None Ordered At This Time.  If you have labs (blood work) drawn today and your tests are completely normal, you will receive your results only by: MyChart Message (if you have MyChart) OR A paper copy in the mail If you have any lab test that is abnormal or we need to change your treatment, we will call you to review the results.  Testing/Procedures: None Ordered At This Time.   Follow-Up: At [facility], you and your health needs are our priority.  As part of our continuing mission to provide you with exceptional heart care, we have created designated Provider Care Teams.  These Care Teams include your primary Cardiologist (physician) and Advanced Practice Providers (APPs -  Physician Assistants and Nurse Practitioners) who all work together to provide you with the care you need, when you need it.  We recommend signing up for the patient portal called "MyChart".  Sign up information is provided on this After Visit Summary.  MyChart is used to connect with patients for Virtual Visits (Telemedicine).  Patients are able to view lab/test results, encounter notes, upcoming appointments, etc.  Non-urgent messages can be sent to your provider as well.   To learn more about what you can do with MyChart, go to [facility].    Your next appointment:   AS NEEDED   The format for your next appointment:   In Person  Provider:   [email or web address], MD

## 2022-04-26 DIAGNOSIS — Z96659 Presence of unspecified artificial knee joint: Secondary | ICD-10-CM | POA: Diagnosis not present

## 2022-04-26 DIAGNOSIS — M25561 Pain in right knee: Secondary | ICD-10-CM | POA: Diagnosis not present

## 2022-05-11 DIAGNOSIS — M25561 Pain in right knee: Secondary | ICD-10-CM | POA: Diagnosis not present

## 2022-05-11 DIAGNOSIS — Z96651 Presence of right artificial knee joint: Secondary | ICD-10-CM | POA: Diagnosis not present

## 2022-05-25 DIAGNOSIS — N529 Male erectile dysfunction, unspecified: Secondary | ICD-10-CM | POA: Diagnosis not present

## 2022-05-25 DIAGNOSIS — M25661 Stiffness of right knee, not elsewhere classified: Secondary | ICD-10-CM | POA: Diagnosis not present

## 2022-05-25 DIAGNOSIS — Z23 Encounter for immunization: Secondary | ICD-10-CM | POA: Diagnosis not present

## 2022-05-25 DIAGNOSIS — R7309 Other abnormal glucose: Secondary | ICD-10-CM | POA: Diagnosis not present

## 2022-05-25 DIAGNOSIS — K760 Fatty (change of) liver, not elsewhere classified: Secondary | ICD-10-CM | POA: Diagnosis not present

## 2022-05-25 DIAGNOSIS — E785 Hyperlipidemia, unspecified: Secondary | ICD-10-CM | POA: Diagnosis not present

## 2022-05-25 DIAGNOSIS — M25569 Pain in unspecified knee: Secondary | ICD-10-CM | POA: Diagnosis not present

## 2022-05-31 DIAGNOSIS — T8453XS Infection and inflammatory reaction due to internal right knee prosthesis, sequela: Secondary | ICD-10-CM | POA: Diagnosis not present

## 2022-05-31 DIAGNOSIS — M25561 Pain in right knee: Secondary | ICD-10-CM | POA: Diagnosis not present

## 2022-06-01 DIAGNOSIS — Z01818 Encounter for other preprocedural examination: Secondary | ICD-10-CM | POA: Diagnosis not present

## 2022-06-01 DIAGNOSIS — E785 Hyperlipidemia, unspecified: Secondary | ICD-10-CM | POA: Diagnosis not present

## 2022-06-01 DIAGNOSIS — M25561 Pain in right knee: Secondary | ICD-10-CM | POA: Diagnosis not present

## 2022-06-09 DIAGNOSIS — M25561 Pain in right knee: Secondary | ICD-10-CM | POA: Diagnosis not present

## 2022-06-10 DIAGNOSIS — M25561 Pain in right knee: Secondary | ICD-10-CM | POA: Diagnosis not present

## 2022-06-17 ENCOUNTER — Telehealth: Payer: Self-pay | Admitting: *Deleted

## 2022-06-17 NOTE — Telephone Encounter (Signed)
   Pre-operative Risk Assessment    Patient Name: Bryan Carroll  DOB: 1951/07/03 MRN: [id number]      Request for Surgical Clearance    Procedure:   RESECTION OF LEFT TKA, PLACEMENT OF ANTIBIOTIC SPACER  Date of Surgery:  Clearance TBD                                 Surgeon:  DR. Samson Frederic Surgeon's Group or Practice Name:  [facility] Phone number:  [phone] Fax number:  [phone]   Type of Clearance Requested:   - Medical  - Pharmacy:  Hold Aspirin NOT INDICATED    Type of Anesthesia:  Not Indicated   Additional requests/questions:    Wilhemina Cash   06/17/2022, 1:19 PM

## 2022-06-17 NOTE — Telephone Encounter (Signed)
   Name: Bryan Carroll  DOB: 06-15-1951  MRN: [id number]  Primary Cardiologist: Maisie Fus, MD  Preoperative team, please contact this patient and set up a phone call appointment for further preoperative risk assessment. Please obtain consent and complete medication review. Thank you for your help.  I confirm that guidance regarding antiplatelet and oral anticoagulation therapy has been completed and, if necessary, noted below.  Per office protocol, if patient is without any new symptoms or concerns at the time of their virtual visit, he/she may hold Aspirin for 5-7 days prior to procedure. Please resume Aspirin as soon as possible postprocedure, at the discretion of the surgeon.   Joylene Grapes, NP 06/17/2022, 4:37 PM [facility]

## 2022-06-18 ENCOUNTER — Telehealth: Payer: Self-pay | Admitting: *Deleted

## 2022-06-18 NOTE — Telephone Encounter (Signed)
Pt agreeable to plan of care for tele pre op appt 06/29/22 @ 9 am. Med rec and consent are done. Pt states he used to Dr. Elease Hashimoto years ago and wanted to change back to Dr. Elease Hashimoto. I informed the pt that there is a protocol that we have to follow. I assured the pt that I will send a message to both Dr. Carolan Clines and Dr. Elease Hashimoto to ok the change. I then instructed the pt he will need to make an appt with Dr. Elease Hashimoto probably in January 2024. Pt thanked me for the help and the call today.

## 2022-06-18 NOTE — Telephone Encounter (Signed)
Pt agreeable to plan of care for tele pre op appt 06/29/22 @ 9 am. Med rec and consent are done. Pt states he used to Dr. Elease Hashimoto years ago and wanted to change back to Dr. Elease Hashimoto. I informed the pt that there is a protocol that we have to follow. I assured the pt that I will send a message to both Dr. Carolan Clines and Dr. Elease Hashimoto to ok the change. I then instructed the pt he will need to make an appt with Dr. Elease Hashimoto probably in January 2024. Pt thanked me for the help and the call today.     Patient Consent for Virtual Visit        Bryan Carroll has provided verbal consent on 06/18/2022 for a virtual visit (video or telephone).   CONSENT FOR VIRTUAL VISIT FOR:  Bryan Carroll  By participating in this virtual visit I agree to the following:  I hereby voluntarily request, consent and authorize  [facility] and its employed or contracted physicians, physician assistants, nurse practitioners or other licensed health care professionals (the Practitioner), to provide me with telemedicine health care services (the "Services") as deemed necessary by the treating Practitioner. I acknowledge and consent to receive the Services by the Practitioner via telemedicine. I understand that the telemedicine visit will involve communicating with the Practitioner through live audiovisual communication technology and the disclosure of certain medical information by electronic transmission. I acknowledge that I have been given the opportunity to request an in-person assessment or other available alternative prior to the telemedicine visit and am voluntarily participating in the telemedicine visit.  I understand that I have the right to withhold or withdraw my consent to the use of telemedicine in the course of my care at any time, without affecting my right to future care or treatment, and that the Practitioner or I may terminate the telemedicine visit at any time. I understand that I have the right  to inspect all information obtained and/or recorded in the course of the telemedicine visit and may receive copies of available information for a reasonable fee.  I understand that some of the potential risks of receiving the Services via telemedicine include:  Delay or interruption in medical evaluation due to technological equipment failure or disruption; Information transmitted may not be sufficient (e.g. poor resolution of images) to allow for appropriate medical decision making by the Practitioner; and/or  In rare instances, security protocols could fail, causing a breach of personal health information.  Furthermore, I acknowledge that it is my responsibility to provide information about my medical history, conditions and care that is complete and accurate to the best of my ability. I acknowledge that Practitioner's advice, recommendations, and/or decision may be based on factors not within their control, such as incomplete or inaccurate data provided by me or distortions of diagnostic images or specimens that may result from electronic transmissions. I understand that the practice of medicine is not an exact science and that Practitioner makes no warranties or guarantees regarding treatment outcomes. I acknowledge that a copy of this consent can be made available to me via my patient portal [facility] MyChart), or I can request a printed copy by calling the office of [facility].    I understand that my insurance will be billed for this visit.   I have read or had this consent read to me. I understand the contents of this consent, which adequately explains the benefits and risks of the Services being provided via  telemedicine.  I have been provided ample opportunity to ask questions regarding this consent and the Services and have had my questions answered to my satisfaction. I give my informed consent for the services to be provided through the use of telemedicine in my medical  care

## 2022-06-18 NOTE — Telephone Encounter (Signed)
1st attempt to reach pt regarding surgical clearance and the need for a tele visit.  Left a message to call back and ask for the preop team. 

## 2022-06-23 ENCOUNTER — Telehealth: Payer: Self-pay | Admitting: Cardiovascular Disease

## 2022-06-23 NOTE — Telephone Encounter (Signed)
Pt returning nurse's call. Please advise

## 2022-06-23 NOTE — Telephone Encounter (Signed)
-----   Message from Tarri Fuller, CMA sent at 06/22/2022  9:40 AM EST ----- Regarding: FW: change providers Good morning ladies!!!  Happy almost [place] Day, LOL!!! Hey I was not sure now what to do with this. See previous notes. I originally was only speaking with the pt about pre op stuff when they mentioned about wanting to see Dr. Elease Hashimoto. Both MD's have given their ok to change back to Dr. Elease Hashimoto. I had stated to the pt that maybe he can be set up for an appt with Dr.Nahser  maybe in Jan or Feb 2024.   I appreciate the help you all offer.   Thank you  Okey Regal  ----- Message ----- From: Maisie Fus, MD Sent: 06/21/2022   8:18 AM EST To: Tarri Fuller, CMA; Vesta Mixer, MD Subject: RE: change providers                           Fine with me ----- Message ----- From: Vesta Mixer, MD Sent: 06/19/2022   7:03 AM EST To: Tarri Fuller, CMA; Maisie Fus, MD Subject: RE: change providers                           I remember him.  I agree to see him if Dr. [name] is ok with the change  .  Phil ----- Message ----- From: Tarri Fuller, CMA Sent: 06/18/2022   5:52 PM EST To: Vesta Mixer, MD; Maisie Fus, MD Subject: change providers                               Pt states he used to Dr. Elease Hashimoto years ago and wanted to change back to Dr. Elease Hashimoto. I informed the pt that there is a protocol that we have to follow. I assured the pt that I will send a message to both Dr. Carolan Clines and Dr. Elease Hashimoto to ok the change. I then instructed the pt he will need to make an appt with Dr. Elease Hashimoto probably in January 2024. Pt thanked me for the help and the call today.   Dr. Elease Hashimoto the pt told me to tell you this is the pt you used to see before you joined [facility] and he used to own a restaurant. He said he knows you well.    Thank you  Okey Regal

## 2022-06-23 NOTE — Telephone Encounter (Signed)
Pt returned call to office and stated that he is having a pre-op telephone visit, but wants his next to be with Dr Elease Hashimoto. Previously seen Carolan Clines at [facility] office in June 2023 and was advised to follow up as needed. Informed patient that he should be scheduled with Nahser within a year out from that visit to ensure the switch over is followed through with. He understands. Scheduled for 01/2023

## 2022-06-23 NOTE — Telephone Encounter (Signed)
Called patient and left message for him to call office back and I would get him scheduled with Nahser.

## 2022-06-29 ENCOUNTER — Ambulatory Visit: Payer: Medicare Other | Attending: Cardiology | Admitting: Physician Assistant

## 2022-06-29 DIAGNOSIS — Z01810 Encounter for preprocedural cardiovascular examination: Secondary | ICD-10-CM | POA: Diagnosis not present

## 2022-06-29 DIAGNOSIS — M25461 Effusion, right knee: Secondary | ICD-10-CM | POA: Diagnosis not present

## 2022-06-29 DIAGNOSIS — T8453XD Infection and inflammatory reaction due to internal right knee prosthesis, subsequent encounter: Secondary | ICD-10-CM | POA: Diagnosis not present

## 2022-06-29 NOTE — Progress Notes (Signed)
Virtual Visit via Telephone Note   Because of Bryan Carroll's co-morbid illnesses, he is at least at moderate risk for complications without adequate follow up.  This format is felt to be most appropriate for this patient at this time.  The patient did not have access to video technology/had technical difficulties with video requiring transitioning to audio format only (telephone).  All issues noted in this document were discussed and addressed.  No physical exam could be performed with this format.  Please refer to the patient's chart for his consent to telehealth for [facility].  Evaluation Performed:  Preoperative cardiovascular risk assessment _____________   Date:  06/29/2022   Patient ID:  Bryan, Carroll Oct 13, 1950, MRN [id number] Patient Location:  Home Provider location:   Office  Primary Care Provider:  Farris Has, MD Primary Cardiologist:  Maisie Fus, MD (formally Dr. Elease Hashimoto)  Chief Complaint / Patient Profile   71 y.o. y/o male with a h/o GERD, DVT now off of eliquis, HTN, and coronary calcium by CT who is pending resection of left TKA and placement of ABX spacer and presents today for telephonic preoperative cardiovascular risk assessment.  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   TOTAL KNEE ARTHROPLASTY Right 05/26/2017   Procedure: RIGHT TOTAL KNEE ARTHROPLASTY;  Surgeon: Jene Every, MD;  Location: [facility];  Service: Orthopedics;  Laterality: Right;  120 mins    Allergies  Not on File  History of Present Illness    Bryan Carroll is a 71 y.o. male who presents via audio/video conferencing for a telehealth visit today.  Pt was last seen in cardiology clinic on 01/14/22 by Dr. [name].  At that time Bryan Carroll was doing well.  The patient is now pending procedure as outlined above. Since his last visit, he has struggled with knee pain. He reports activity equivalent to 4.0 METS, including climbing stair in his house and grocery shopping. Prior to knee pain, he was walking regularly.    Home Medications    Prior to Admission medications   Medication Sig Start Date End Date Taking? Authorizing Provider  aspirin EC 81 MG tablet Take 1 tablet (81 mg total) by mouth daily. Swallow whole. 01/14/22   Maisie Fus, MD  atorvastatin (LIPITOR) 10 MG tablet Take 10 mg by mouth daily.    [provider]  cephALEXin (KEFLEX) 500 MG capsule Take 500 mg by mouth 4 (four) times daily.    [provider]  lisinopril (PRINIVIL,ZESTRIL) 20 MG tablet Take 20 mg by  mouth every morning.     [provider]  oxyCODONE-acetaminophen (PERCOCET) 5-325 MG tablet Take 1-2 tablets by mouth every 4 (four) hours as needed for severe pain. Patient not taking: Reported on 06/18/2022 05/26/17   Jene Every, MD  oxyCODONE-acetaminophen (ROXICET) 5-325 MG tablet Take 1 tablet by mouth every 4 (four) hours as needed for severe pain. Patient not taking: Reported on 01/14/2022 07/22/17   Jene Every, MD    Physical Exam    Vital Signs:  Bryan Carroll does not have vital signs available  for review today.  Given telephonic nature of communication, physical exam is limited. AAOx3. NAD. Normal affect.  Speech and respirations are unlabored.  Accessory Clinical Findings    None  Assessment & Plan    1.  Preoperative Cardiovascular Risk Assessment:  He does not have a history of ischemic heart disease, PCI, or stroke. He reports activity equivalent to 4.0 METS (stairs in his house, grocery store). He denies cardiac symptoms. According to the RCRI, he has a 0.4% risk of MACE.   Therefore, based on ACC/AHA guidelines, the patient would be at acceptable risk for the planned procedure without further cardiovascular testing.   The patient was advised that if he develops new symptoms prior to surgery to contact our office to arrange for a follow-up visit, and he verbalized understanding.   A copy of this note will be routed to requesting surgeon.  Time:   Today, I have spent 10 minutes with the patient with telehealth technology discussing medical history, symptoms, and management plan.     Bryan Rutherford Laurey Salser, PA  06/29/2022, 9:14 AM

## 2022-07-13 DIAGNOSIS — M25569 Pain in unspecified knee: Secondary | ICD-10-CM | POA: Diagnosis not present

## 2022-07-13 DIAGNOSIS — E785 Hyperlipidemia, unspecified: Secondary | ICD-10-CM | POA: Diagnosis not present

## 2022-07-13 DIAGNOSIS — Z0000 Encounter for general adult medical examination without abnormal findings: Secondary | ICD-10-CM | POA: Diagnosis not present

## 2022-07-13 DIAGNOSIS — I10 Essential (primary) hypertension: Secondary | ICD-10-CM | POA: Diagnosis not present

## 2022-08-03 NOTE — Progress Notes (Signed)
Sent message, via epic in basket, requesting orders in epic from surgeon.  

## 2022-08-05 ENCOUNTER — Ambulatory Visit: Payer: Self-pay | Admitting: Student

## 2022-08-06 NOTE — Progress Notes (Signed)
Second request for pre op orders in epic: Left a voicemail with Marianna Fuss at Dr. Sid Falcon office

## 2022-08-09 NOTE — Progress Notes (Signed)
Pt. Needs orders for surgery. 

## 2022-08-09 NOTE — Patient Instructions (Signed)
DUE TO COVID-19 ONLY TWO VISITORS  (aged 70 and older)  ARE ALLOWED TO COME WITH YOU AND STAY IN THE WAITING ROOM ONLY DURING PRE OP AND PROCEDURE.   **NO VISITORS ARE ALLOWED IN THE SHORT STAY AREA OR RECOVERY ROOM!!**  IF YOU WILL BE ADMITTED INTO THE HOSPITAL YOU ARE ALLOWED ONLY FOUR SUPPORT PEOPLE DURING VISITATION HOURS ONLY (7 AM -8PM)   The support person(s) must pass our screening, gel in and out, and wear a mask at all times, including in the patient's room. Patients must also wear a mask when staff or their support person are in the room. Visitors GUEST BADGE MUST BE WORN VISIBLY  One adult visitor may remain with you overnight and MUST be in the room by 8 P.M.     Your procedure is scheduled on: 08/19/22   Report to [facility] Main Entrance    Report to admitting at : 8:00 AM   Call this number if you have problems the morning of surgery [phone]   Do not eat food :After Midnight.   After Midnight you may have the following liquids until : 7:30 AM DAY OF SURGERY  Water Black Coffee (sugar ok, NO MILK/CREAM OR CREAMERS)  Tea (sugar ok, NO MILK/CREAM OR CREAMERS) regular and decaf                             Plain Jell-O (NO RED)                                           Fruit ices (not with fruit pulp, NO RED)                                     Popsicles (NO RED)                                                                  Juice: apple, WHITE grape, WHITE cranberry Sports drinks like Gatorade (NO RED)   The day of surgery:  Drink ONE (1) Pre-Surgery Clear Ensure or G2 at : 7:30 AM the morning of surgery. Drink in one sitting. Do not sip.  This drink was given to you during your hospital  pre-op appointment visit. Nothing else to drink after completing the  Pre-Surgery Clear Ensure or G2.          If you have questions, please contact your surgeon's office.  Oral Hygiene is also important to reduce your risk of infection.                                     Remember - BRUSH YOUR TEETH THE MORNING OF SURGERY WITH YOUR REGULAR TOOTHPASTE  DENTURES WILL BE REMOVED PRIOR TO SURGERY PLEASE DO NOT APPLY "Poly grip" OR ADHESIVES!!!   Do NOT smoke after Midnight   Take these medicines the morning of surgery with A SIP OF WATER:keflex  You may not have any metal on your body including hair pins, jewelry, and body piercing             Do not wear lotions, powders, perfumes/cologne, or deodorant               Men may shave face and neck.   Do not bring valuables to the hospital. [facility] IS NOT             RESPONSIBLE   FOR VALUABLES.   Contacts, glasses, or bridgework may not be worn into surgery.   Bring small overnight bag day of surgery.   DO NOT BRING YOUR HOME MEDICATIONS TO THE HOSPITAL. PHARMACY WILL DISPENSE MEDICATIONS LISTED ON YOUR MEDICATION LIST TO YOU DURING YOUR ADMISSION IN THE HOSPITAL!    Patients discharged on the day of surgery will not be allowed to drive home.  Someone NEEDS to stay with you for the first 24 hours after anesthesia.   Special Instructions: Bring a copy of your healthcare power of attorney and living will documents         the day of surgery if you haven't scanned them before.              Please read over the following fact sheets you were given: IF YOU HAVE QUESTIONS ABOUT YOUR PRE-OP INSTRUCTIONS PLEASE CALL [phone]    [facility] - Preparing for Surgery Before surgery, you can play an important role.  Because skin is not sterile, your skin needs to be as free of germs as possible.  You can reduce the number of germs on your skin by washing with CHG (chlorahexidine gluconate) soap before surgery.  CHG is an antiseptic cleaner which kills germs and bonds with the skin to continue killing germs even after washing. Please DO NOT use if you have an allergy to CHG or antibacterial soaps.  If your skin becomes reddened/irritated stop using the CHG and inform your nurse  when you arrive at Short Stay. Do not shave (including legs and underarms) for at least 48 hours prior to the first CHG shower.  You may shave your face/neck. Please follow these instructions carefully:  1.  Shower with CHG Soap the night before surgery and the  morning of Surgery.  2.  If you choose to wash your hair, wash your hair first as usual with your  normal  shampoo.  3.  After you shampoo, rinse your hair and body thoroughly to remove the  shampoo.                           4.  Use CHG as you would any other liquid soap.  You can apply chg directly  to the skin and wash                       Gently with a scrungie or clean washcloth.  5.  Apply the CHG Soap to your body ONLY FROM THE NECK DOWN.   Do not use on face/ open                           Wound or open sores. Avoid contact with eyes, ears mouth and genitals (private parts).                       Wash face,  Genitals (private parts) with  your normal soap.             6.  Wash thoroughly, paying special attention to the area where your surgery  will be performed.  7.  Thoroughly rinse your body with warm water from the neck down.  8.  DO NOT shower/wash with your normal soap after using and rinsing off  the CHG Soap.                9.  Pat yourself dry with a clean towel.            10.  Wear clean pajamas.            11.  Place clean sheets on your bed the night of your first shower and do not  sleep with pets. Day of Surgery : Do not apply any lotions/deodorants the morning of surgery.  Please wear clean clothes to the hospital/surgery center.  FAILURE TO FOLLOW THESE INSTRUCTIONS MAY RESULT IN THE CANCELLATION OF YOUR SURGERY PATIENT SIGNATURE_________________________________  NURSE SIGNATURE__________________________________  ________________________________________________________________________   Adam Phenix  An incentive spirometer is a tool that can help keep your lungs clear and active. This tool  measures how well you are filling your lungs with each breath. Taking long deep breaths may help reverse or decrease the chance of developing breathing (pulmonary) problems (especially infection) following: A long period of time when you are unable to move or be active. BEFORE THE PROCEDURE  If the spirometer includes an indicator to show your best effort, your nurse or respiratory therapist will set it to a desired goal. If possible, sit up straight or lean slightly forward. Try not to slouch. Hold the incentive spirometer in an upright position. INSTRUCTIONS FOR USE  Sit on the edge of your bed if possible, or sit up as far as you can in bed or on a chair. Hold the incentive spirometer in an upright position. Breathe out normally. Place the mouthpiece in your mouth and seal your lips tightly around it. Breathe in slowly and as deeply as possible, raising the piston or the ball toward the top of the column. Hold your breath for 3-5 seconds or for as long as possible. Allow the piston or ball to fall to the bottom of the column. Remove the mouthpiece from your mouth and breathe out normally. Rest for a few seconds and repeat Steps 1 through 7 at least 10 times every 1-2 hours when you are awake. Take your time and take a few normal breaths between deep breaths. The spirometer may include an indicator to show your best effort. Use the indicator as a goal to work toward during each repetition. After each set of 10 deep breaths, practice coughing to be sure your lungs are clear. If you have an incision (the cut made at the time of surgery), support your incision when coughing by placing a pillow or rolled up towels firmly against it. Once you are able to get out of bed, walk around indoors and cough well. You may stop using the incentive spirometer when instructed by your caregiver.  RISKS AND COMPLICATIONS Take your time so you do not get dizzy or light-headed. If you are in pain, you may need to  take or ask for pain medication before doing incentive spirometry. It is harder to take a deep breath if you are having pain. AFTER USE Rest and breathe slowly and easily. It can be helpful to keep track of a log of your progress. Your  caregiver can provide you with a simple table to help with this. If you are using the spirometer at home, follow these instructions: SEEK MEDICAL CARE IF:  You are having difficultly using the spirometer. You have trouble using the spirometer as often as instructed. Your pain medication is not giving enough relief while using the spirometer. You develop fever of 100.5 F (38.1 C) or higher. SEEK IMMEDIATE MEDICAL CARE IF:  You cough up bloody sputum that had not been present before. You develop fever of 102 F (38.9 C) or greater. You develop worsening pain at or near the incision site. MAKE SURE YOU:  Understand these instructions. Will watch your condition. Will get help right away if you are not doing well or get worse. Document Released: 11/29/2006 Document Revised: 10/11/2011 Document Reviewed: 01/30/2007 [facility] Patient Information 2014 [place], [place].   ________________________________________________________________________

## 2022-08-10 ENCOUNTER — Ambulatory Visit: Payer: Self-pay | Admitting: Student

## 2022-08-10 ENCOUNTER — Other Ambulatory Visit: Payer: Self-pay

## 2022-08-10 ENCOUNTER — Encounter (HOSPITAL_COMMUNITY)
Admission: RE | Admit: 2022-08-10 | Discharge: 2022-08-10 | Disposition: A | Discharge status: Home / Self Care | Payer: Medicare Other | Source: Ambulatory Visit | Attending: Orthopedic Surgery | Admitting: Orthopedic Surgery

## 2022-08-10 ENCOUNTER — Encounter (HOSPITAL_COMMUNITY): Payer: Self-pay

## 2022-08-10 VITALS — BP 151/85 | HR 86 | Temp 97.6°F | Ht 71.0 in | Wt 205.0 lb

## 2022-08-10 DIAGNOSIS — M009 Pyogenic arthritis, unspecified: Secondary | ICD-10-CM | POA: Insufficient documentation

## 2022-08-10 DIAGNOSIS — I2510 Atherosclerotic heart disease of native coronary artery without angina pectoris: Secondary | ICD-10-CM | POA: Diagnosis not present

## 2022-08-10 DIAGNOSIS — Z01818 Encounter for other preprocedural examination: Secondary | ICD-10-CM

## 2022-08-10 HISTORY — DX: Peripheral vascular disease, unspecified: I73.9

## 2022-08-10 HISTORY — DX: Unspecified osteoarthritis, unspecified site: M19.90

## 2022-08-10 LAB — CBC
HCT: 47.1 % (ref 39.0–52.0)
Hemoglobin: 14.7 g/dL (ref 13.0–17.0)
MCH: 27.3 pg (ref 26.0–34.0)
MCHC: 31.2 g/dL (ref 30.0–36.0)
MCV: 87.5 fL (ref 80.0–100.0)
Platelets: 262 10*3/uL (ref 150–400)
RBC: 5.38 MIL/uL (ref 4.22–5.81)
RDW: 13.5 % (ref 11.5–15.5)
WBC: 8 10*3/uL (ref 4.0–10.5)
nRBC: 0 % (ref 0.0–0.2)

## 2022-08-10 LAB — BASIC METABOLIC PANEL
Anion gap: 8 (ref 5–15)
BUN: 13 mg/dL (ref 8–23)
CO2: 26 mmol/L (ref 22–32)
Calcium: 9.1 mg/dL (ref 8.9–10.3)
Chloride: 103 mmol/L (ref 98–111)
Creatinine, Ser: 0.8 mg/dL (ref 0.61–1.24)
GFR, Estimated: >60 mL/min (ref >60)
Glucose, Bld: 109 mg/dL — ABNORMAL HIGH (ref 70–99)
Potassium: 4.5 mmol/L (ref 3.5–5.1)
Sodium: 137 mmol/L (ref 135–145)

## 2022-08-10 LAB — SURGICAL PCR SCREEN
MRSA, PCR: NEGATIVE
Staphylococcus aureus: NEGATIVE

## 2022-08-10 NOTE — Progress Notes (Signed)
As per pt. He tried to return the phone call from pharmacy yesterday,but he was unable to get in contact with them Pharmacy tech was called by RN during PST appointment,but she was behind and had 3 other calls at front.The tech number was provided to the pt. As per her suggestion,So pt. can call her later and go over his medicines.

## 2022-08-10 NOTE — Progress Notes (Addendum)
For Short Stay: [place] appointment date:  Bowel Prep reminder:   For Anesthesia: PCP - Dr. London Pepper Cardiologist - Dr. Royetta Crochet [place] Clearance: Fabian Sharp: PA-C: 06/29/22 Chest x-ray -  EKG - 01/14/22 Stress Test -  ECHO -  Cardiac Cath -  Pacemaker/ICD device last checked: Pacemaker orders received: Device Rep notified:  Spinal Cord Stimulator:  Sleep Study -  CPAP -   Fasting Blood Sugar -  Checks Blood Sugar _____ times a day Date and result of last Hgb A1c-  Last dose of GLP1 agonist-  GLP1 instructions:   Last dose of SGLT-2 inhibitors-  SGLT-2 instructions:   Blood Thinner Instructions: Aspirin Instructions: Hold a week before surgery: Dr. Lyla Glassing Last Dose:  Activity level: Can go up a flight of stairs and activities of daily living without stopping and without chest pain and/or shortness of breath   Able to exercise without chest pain and/or shortness of breath   Unable to go up a flight of stairs without chest pain and/or shortness of breath     Anesthesia review: Hx: HTN,DVT,palpitations.  Patient denies shortness of breath, fever, cough and chest pain at PAT appointment   Patient verbalized understanding of instructions that were given to them at the PAT appointment. Patient was also instructed that they will need to review over the PAT instructions again at home before surgery.

## 2022-08-11 NOTE — Progress Notes (Signed)
Anesthesia Chart Review   Case: [id number] Date/Time: 08/19/22 1024   Procedure: EXCISIONAL TOTAL KNEE ARTHROPLASTY WITH ANTIBIOTIC SPACERS PLACEMENT (Right: Knee) - 240   Anesthesia type: Spinal   Pre-op diagnosis: Right knee post joint infection   [facility] ROOM 08 / [facility]   Surgeons: Rod Can, MD       DISCUSSION:72 y.o. never smoker with h/o HTN, PVD, DVT, right knee post joint infection scheduled for above procedure 08/19/2022 with Dr. Rod Can.   Pt last seen by cardiology 06/29/2022. Per OV note, "He does not have a history of ischemic heart disease, PCI, or stroke. He reports activity equivalent to 4.0 METS (stairs in his house, grocery store). He denies cardiac symptoms. According to the RCRI, he has a 0.4% risk of MACE.    Therefore, based on ACC/AHA guidelines, the patient would be at acceptable risk for the planned procedure without further cardiovascular testing."  Anticipate pt can proceed with planned procedure barring acute status change.   VS: BP (!) 151/85   Pulse 86   Temp 36.4 C (Oral)   Ht 5\' 11"  (1.803 m)   Wt 93 kg   SpO2 100%   BMI 28.59 kg/m   PROVIDERS: London Pepper, MD is PCP   Primary Cardiologist:  Janina Mayo, MD (formally Dr. Acie Fredrickson)  LABS: Labs reviewed: Acceptable for surgery. 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   Flexion contracture of knee, right    post op total knee 05-26-2017   GERD (gastroesophageal reflux disease)    History of colon polyps    History of DVT of lower extremity 06/13/2017   ED visit --  dvt right lower leg post op  right total knee   Hypertension    Palpitations    per cardiologist note, dr Dorris Carnes, 2015--  24hr monitor show SB to ST w/  rare PVCs and rare PACs   Peripheral vascular disease ([place])     Past Surgical History:  Procedure Laterality Date   EXAM UNDER ANESTHESIA WITH MANIPULATION OF KNEE Right 07/22/2017   Procedure: EXAM UNDER ANESTHESIA WITH MANIPULATION OF KNEE;  Surgeon: Susa Day, MD;  Location: [address];  Service: Orthopedics;  Laterality: Right;  30 mins   INGUINAL HERNIA REPAIR Right 2003  approx.   TOTAL KNEE ARTHROPLASTY Right 05/26/2017   Procedure: RIGHT TOTAL KNEE ARTHROPLASTY;  Surgeon: Susa Day, MD;  Location[facility];  Service: Orthopedics;  Laterality: Right;  120 mins    MEDICATIONS:  aspirin EC 81 MG tablet   atorvastatin (LIPITOR) 10 MG tablet   cephALEXin (KEFLEX) 500 MG capsule   lisinopril (PRINIVIL,ZESTRIL) 20 MG tablet   meloxicam (MOBIC) 7.5 MG tablet   No current facility-administered medications for this encounter.   Konrad Felix Ward, PA-C [facility] [phone]

## 2022-08-16 ENCOUNTER — Ambulatory Visit: Payer: Self-pay | Admitting: Student

## 2022-08-16 NOTE — H&P (Signed)
TOTAL KNEE REVISION ADMISSION H&P  Patient is being admitted for right revision total knee arthroplasty.  Subjective:  Chief Complaint:right knee pain.  HPI: Bryan Carroll, 72 y.o. male, has a history of pain and functional disability in the right knee(s) due to failed previous arthroplasty and infection  and patient has failed non-surgical conservative treatments for greater than 12 weeks to include NSAID's and/or analgesics, supervised PT with diminished ADL's post treatment, use of assistive devices, and activity modification. The indications for the revision of the total knee arthroplasty are history of total knee infection. Onset of symptoms was gradual starting 1 years ago with rapidlly worsening course since that time.  Prior procedures on the right knee(s) include arthroplasty.  Patient currently rates pain in the right knee(s) at 10 out of 10 with activity. There is night pain, worsening of pain with activity and weight bearing, pain that interferes with activities of daily living, pain with passive range of motion, and joint swelling.  Patient has evidence of  effusion and synovitis  by imaging studies. This condition presents safety issues increasing the risk of falls.    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[place])    right   Flexion contracture of knee, right    post op total knee 05-26-2017   GERD (gastroesophageal reflux disease)    History of colon polyps    History of DVT of lower extremity 06/13/2017   ED visit --  dvt right lower leg post op right total knee   Hypertension    Palpitations    per cardiologist note, dr Dorris Carnes, 2015--  24hr monitor show SB to ST w/  rare PVCs and rare PACs   Peripheral vascular disease ([place])     Past Surgical History:  Procedure Laterality  Date   EXAM UNDER ANESTHESIA WITH MANIPULATION OF KNEE Right 07/22/2017   Procedure: EXAM UNDER ANESTHESIA WITH MANIPULATION OF KNEE;  Surgeon: Susa Day, MD;  Location: [facility];  Service: Orthopedics;  Laterality: Right;  30 mins   INGUINAL HERNIA REPAIR Right 2003  approx.   TOTAL KNEE ARTHROPLASTY Right 05/26/2017   Procedure: RIGHT TOTAL KNEE ARTHROPLASTY;  Surgeon: Susa Day, MD;  Location: [facility];  Service: Orthopedics;  Laterality: Right;  120 mins    Current Outpatient Medications  Medication Sig Dispense Refill Last Dose   aspirin EC 81 MG tablet Take 1 tablet (81 mg total) by mouth daily. Swallow whole. 90 tablet 3    atorvastatin (LIPITOR) 10 MG tablet Take 10 mg by mouth daily.      cephALEXin (KEFLEX) 500 MG capsule Take 500 mg by mouth 4 (four) times daily.      lisinopril (PRINIVIL,ZESTRIL) 20 MG tablet Take 20 mg by mouth every morning.       meloxicam (MOBIC) 7.5 MG tablet Take 7.5 mg by mouth daily.      No current facility-administered medications for this visit.   Not on File  Social History   Tobacco Use   Smoking status: Never   Smokeless tobacco: Never  Substance Use Topics   Alcohol use: Yes    Alcohol/week: 1.0 - 2.0 standard drink of alcohol    Types: 1 - 2 Glasses of wine per week    Comment: occasionally  Family History  Problem Relation Age of Onset   Hypertension Mother    Heart disease Brother    Heart disease Brother       Review of Systems  Musculoskeletal:  Positive for arthralgias, gait problem and joint swelling.  All other systems reviewed and are negative.    Objective:  Physical Exam Constitutional:      Appearance: Normal appearance.  HENT:     Head: Normocephalic and atraumatic.     Nose: Nose normal.     Mouth/Throat:     Mouth: Mucous membranes are moist.     Pharynx: Oropharynx is clear.  Eyes:     Conjunctiva/sclera: Conjunctivae normal.  Cardiovascular:     Rate and Rhythm: Normal rate  and regular rhythm.     Pulses: Normal pulses.     Heart sounds: Normal heart sounds.  Pulmonary:     Effort: Pulmonary effort is normal.     Breath sounds: Normal breath sounds.  Abdominal:     General: Abdomen is flat.     Palpations: Abdomen is soft.  Genitourinary:    Comments: deferred Musculoskeletal:     Cervical back: Normal range of motion and neck supple.     Comments: Examination of the right knee reveals a healed anterior incision. He has some swelling. There is an effusion. Mild warmth. No erythema. Diffuse tenderness to palpation about the knee. Range of motion 0 to 90 degrees without any ligamentous instability. No extensor lag. Painless range of motion of the hip.  Neurovascular intact distally.   Skin:    General: Skin is warm and dry.     Capillary Refill: Capillary refill takes less than 2 seconds.  Neurological:     General: No focal deficit present.     Mental Status: He is alert and oriented to person, place, and time.  Psychiatric:        Mood and Affect: Mood normal.        Behavior: Behavior normal.        Thought Content: Thought content normal.        Judgment: Judgment normal.     Vital signs in last 24 hours: @VSRANGES @  Labs:  Estimated body mass index is 28.59 kg/m as calculated from the following:   Height as of 08/10/22: 5\' 11"  (1.803 m).   Weight as of 08/10/22: 93 kg.  Imaging Review Plain radiographs demonstrate severe degenerative joint disease of the right knee(s). The overall alignment is neutral.There is evidence of infection in his right knee. The bone quality appears to be adequate for age and reported activity level.    Assessment/Plan:  End stage arthritis, right knee(s) with failed previous arthroplasty.   The patient history, physical examination, clinical judgment of the provider and imaging studies are consistent with end stage degenerative joint disease of the right knee(s), previous total knee arthroplasty. Revision total  knee arthroplasty is deemed medically necessary. The treatment options including medical management, injection therapy, arthroscopy and revision arthroplasty were discussed at length. The risks and benefits of revision total knee arthroplasty were presented and reviewed. The risks due to aseptic loosening, infection, stiffness, patella tracking problems, thromboembolic complications and other imponderables were discussed. The patient acknowledged the explanation, agreed to proceed with the plan and consent was signed. Patient is being admitted for inpatient treatment for surgery, pain control, PT, OT, prophylactic antibiotics, VTE prophylaxis, progressive ambulation and ADL's and discharge planning.The patient is planning to be discharged home after a 2-3 night stay with  OPPT.    Therapy Plans: outpatient therapy. at [facility]  Disposition: Home with family Planned DVT Prophylaxis: Eliquis 2.5mg  BID DME needed: walker. Will consider iceman.  PCP: Cleared, stop meloxicam 7 days prior to surgery.  Cardiology: Cleared TXA: IV Allergies: NDKA.  Anesthesia Concerns: None.  BMI: 28.6 Last HgbA1c: 5.8 Other: - History of RLE DVT, 06/13/2017. - ASA 81mg  at baseline.  - Cephalexin.  - Oxycodone, zofran.  - 08/10/22: Cr. 0.80, Hgb 14.7.

## 2022-08-16 NOTE — H&P (View-Only) (Signed)
TOTAL KNEE REVISION ADMISSION H&P  Patient is being admitted for right revision total knee arthroplasty.  Subjective:  Chief Complaint:right knee pain.  HPI: Bryan Carroll, 72 y.o. male, has a history of pain and functional disability in the right knee(s) due to failed previous arthroplasty and infection  and patient has failed non-surgical conservative treatments for greater than 12 weeks to include NSAID's and/or analgesics, supervised PT with diminished ADL's post treatment, use of assistive devices, and activity modification. The indications for the revision of the total knee arthroplasty are history of total knee infection. Onset of symptoms was gradual starting 1 years ago with rapidlly worsening course since that time.  Prior procedures on the right knee(s) include arthroplasty.  Patient currently rates pain in the right knee(s) at 10 out of 10 with activity. There is night pain, worsening of pain with activity and weight bearing, pain that interferes with activities of daily living, pain with passive range of motion, and joint swelling.  Patient has evidence of  effusion and synovitis  by imaging studies. This condition presents safety issues increasing the risk of falls.    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[place])    right   Flexion contracture of knee, right    post op total knee 05-26-2017   GERD (gastroesophageal reflux disease)    History of colon polyps    History of DVT of lower extremity 06/13/2017   ED visit --  dvt right lower leg post op right total knee   Hypertension    Palpitations    per cardiologist note, dr Dorris Carnes, 2015--  24hr monitor show SB to ST w/  rare PVCs and rare PACs   Peripheral vascular disease ([place])     Past Surgical History:  Procedure Laterality  Date   EXAM UNDER ANESTHESIA WITH MANIPULATION OF KNEE Right 07/22/2017   Procedure: EXAM UNDER ANESTHESIA WITH MANIPULATION OF KNEE;  Surgeon: Susa Day, MD;  Location: [facility];  Service: Orthopedics;  Laterality: Right;  30 mins   INGUINAL HERNIA REPAIR Right 2003  approx.   TOTAL KNEE ARTHROPLASTY Right 05/26/2017   Procedure: RIGHT TOTAL KNEE ARTHROPLASTY;  Surgeon: Susa Day, MD;  Location: [facility];  Service: Orthopedics;  Laterality: Right;  120 mins    Current Outpatient Medications  Medication Sig Dispense Refill Last Dose   aspirin EC 81 MG tablet Take 1 tablet (81 mg total) by mouth daily. Swallow whole. 90 tablet 3    atorvastatin (LIPITOR) 10 MG tablet Take 10 mg by mouth daily.      cephALEXin (KEFLEX) 500 MG capsule Take 500 mg by mouth 4 (four) times daily.      lisinopril (PRINIVIL,ZESTRIL) 20 MG tablet Take 20 mg by mouth every morning.       meloxicam (MOBIC) 7.5 MG tablet Take 7.5 mg by mouth daily.      No current facility-administered medications for this visit.   Not on File  Social History   Tobacco Use   Smoking status: Never   Smokeless tobacco: Never  Substance Use Topics   Alcohol use: Yes    Alcohol/week: 1.0 - 2.0 standard drink of alcohol    Types: 1 - 2 Glasses of wine per week    Comment: occasionally  Family History  Problem Relation Age of Onset   Hypertension Mother    Heart disease Brother    Heart disease Brother       Review of Systems  Musculoskeletal:  Positive for arthralgias, gait problem and joint swelling.  All other systems reviewed and are negative.    Objective:  Physical Exam Constitutional:      Appearance: Normal appearance.  HENT:     Head: Normocephalic and atraumatic.     Nose: Nose normal.     Mouth/Throat:     Mouth: Mucous membranes are moist.     Pharynx: Oropharynx is clear.  Eyes:     Conjunctiva/sclera: Conjunctivae normal.  Cardiovascular:     Rate and Rhythm: Normal rate  and regular rhythm.     Pulses: Normal pulses.     Heart sounds: Normal heart sounds.  Pulmonary:     Effort: Pulmonary effort is normal.     Breath sounds: Normal breath sounds.  Abdominal:     General: Abdomen is flat.     Palpations: Abdomen is soft.  Genitourinary:    Comments: deferred Musculoskeletal:     Cervical back: Normal range of motion and neck supple.     Comments: Examination of the right knee reveals a healed anterior incision. He has some swelling. There is an effusion. Mild warmth. No erythema. Diffuse tenderness to palpation about the knee. Range of motion 0 to 90 degrees without any ligamentous instability. No extensor lag. Painless range of motion of the hip.  Neurovascular intact distally.   Skin:    General: Skin is warm and dry.     Capillary Refill: Capillary refill takes less than 2 seconds.  Neurological:     General: No focal deficit present.     Mental Status: He is alert and oriented to person, place, and time.  Psychiatric:        Mood and Affect: Mood normal.        Behavior: Behavior normal.        Thought Content: Thought content normal.        Judgment: Judgment normal.     Vital signs in last 24 hours: @VSRANGES @  Labs:  Estimated body mass index is 28.59 kg/m as calculated from the following:   Height as of 08/10/22: 5\' 11"  (1.803 m).   Weight as of 08/10/22: 93 kg.  Imaging Review Plain radiographs demonstrate severe degenerative joint disease of the right knee(s). The overall alignment is neutral.There is evidence of infection in his right knee. The bone quality appears to be adequate for age and reported activity level.    Assessment/Plan:  End stage arthritis, right knee(s) with failed previous arthroplasty.   The patient history, physical examination, clinical judgment of the provider and imaging studies are consistent with end stage degenerative joint disease of the right knee(s), previous total knee arthroplasty. Revision total  knee arthroplasty is deemed medically necessary. The treatment options including medical management, injection therapy, arthroscopy and revision arthroplasty were discussed at length. The risks and benefits of revision total knee arthroplasty were presented and reviewed. The risks due to aseptic loosening, infection, stiffness, patella tracking problems, thromboembolic complications and other imponderables were discussed. The patient acknowledged the explanation, agreed to proceed with the plan and consent was signed. Patient is being admitted for inpatient treatment for surgery, pain control, PT, OT, prophylactic antibiotics, VTE prophylaxis, progressive ambulation and ADL's and discharge planning.The patient is planning to be discharged home after a 2-3 night stay with  OPPT.    Therapy Plans: outpatient therapy. at [facility].  Disposition: Home with family Planned DVT Prophylaxis: Eliquis 2.5mg BID DME needed: walker. Will consider iceman.  PCP: Cleared, stop meloxicam 7 days prior to surgery.  Cardiology: Cleared TXA: IV Allergies: NDKA.  Anesthesia Concerns: None.  BMI: 28.6 Last HgbA1c: 5.8 Other: - History of RLE DVT, 06/13/2017. - ASA 81mg at baseline.  - Cephalexin.  - Oxycodone, zofran.  - 08/10/22: Cr. 0.80, Hgb 14.7.   

## 2022-08-19 ENCOUNTER — Encounter (HOSPITAL_COMMUNITY): Payer: Self-pay | Admitting: Orthopedic Surgery

## 2022-08-19 ENCOUNTER — Ambulatory Visit
Admission: RE | Admit: 2022-08-19 | Discharge: 2022-08-19 | Disposition: A | Discharge status: Home / Self Care | Payer: Self-pay | Source: Ambulatory Visit | Attending: Student | Admitting: Student

## 2022-08-19 ENCOUNTER — Other Ambulatory Visit: Payer: Self-pay

## 2022-08-19 ENCOUNTER — Inpatient Hospital Stay (HOSPITAL_COMMUNITY)
Admission: RE | Admit: 2022-08-19 | Discharge: 2022-08-22 | DRG: 468 | Disposition: A | Discharge status: Home Health | Payer: Medicare Other | Attending: Orthopedic Surgery | Admitting: Orthopedic Surgery

## 2022-08-19 ENCOUNTER — Encounter (HOSPITAL_COMMUNITY)
Admission: RE | Disposition: A | Discharge status: Home Health | Payer: Self-pay | Source: Home / Self Care | Attending: Orthopedic Surgery

## 2022-08-19 ENCOUNTER — Inpatient Hospital Stay (HOSPITAL_COMMUNITY): Payer: Medicare Other | Admitting: Physician Assistant

## 2022-08-19 ENCOUNTER — Inpatient Hospital Stay (HOSPITAL_COMMUNITY): Payer: Medicare Other | Admitting: Anesthesiology

## 2022-08-19 ENCOUNTER — Inpatient Hospital Stay (HOSPITAL_COMMUNITY): Payer: Medicare Other

## 2022-08-19 DIAGNOSIS — Z7982 Long term (current) use of aspirin: Secondary | ICD-10-CM

## 2022-08-19 DIAGNOSIS — I739 Peripheral vascular disease, unspecified: Secondary | ICD-10-CM

## 2022-08-19 DIAGNOSIS — Y831 Surgical operation with implant of artificial internal device as the cause of abnormal reaction of the patient, or of later complication, without mention of misadventure at the time of the procedure: Secondary | ICD-10-CM | POA: Diagnosis present

## 2022-08-19 DIAGNOSIS — I10 Essential (primary) hypertension: Secondary | ICD-10-CM

## 2022-08-19 DIAGNOSIS — T8453XA Infection and inflammatory reaction due to internal right knee prosthesis, initial encounter: Principal | ICD-10-CM

## 2022-08-19 DIAGNOSIS — Z791 Long term (current) use of non-steroidal anti-inflammatories (NSAID): Secondary | ICD-10-CM | POA: Diagnosis not present

## 2022-08-19 DIAGNOSIS — Z8249 Family history of ischemic heart disease and other diseases of the circulatory system: Secondary | ICD-10-CM | POA: Diagnosis not present

## 2022-08-19 DIAGNOSIS — Z79899 Other long term (current) drug therapy: Secondary | ICD-10-CM

## 2022-08-19 DIAGNOSIS — Z86718 Personal history of other venous thrombosis and embolism: Secondary | ICD-10-CM

## 2022-08-19 DIAGNOSIS — I2510 Atherosclerotic heart disease of native coronary artery without angina pectoris: Secondary | ICD-10-CM

## 2022-08-19 DIAGNOSIS — K449 Diaphragmatic hernia without obstruction or gangrene: Secondary | ICD-10-CM | POA: Diagnosis not present

## 2022-08-19 DIAGNOSIS — Z86010 Personal history of colonic polyps: Secondary | ICD-10-CM | POA: Diagnosis not present

## 2022-08-19 DIAGNOSIS — K219 Gastro-esophageal reflux disease without esophagitis: Secondary | ICD-10-CM | POA: Diagnosis not present

## 2022-08-19 DIAGNOSIS — G8918 Other acute postprocedural pain: Secondary | ICD-10-CM | POA: Diagnosis not present

## 2022-08-19 DIAGNOSIS — R600 Localized edema: Secondary | ICD-10-CM | POA: Diagnosis not present

## 2022-08-19 DIAGNOSIS — Z96651 Presence of right artificial knee joint: Secondary | ICD-10-CM | POA: Diagnosis not present

## 2022-08-19 HISTORY — PX: EXCISIONAL TOTAL KNEE ARTHROPLASTY WITH ANTIBIOTIC SPACERS: SHX5827

## 2022-08-19 SURGERY — REMOVAL, TOTAL ARTHROPLASTY HARDWARE, KNEE, WITH ANTIBIOTIC SPACER INSERTION
Anesthesia: Spinal | Site: Knee | Laterality: Right

## 2022-08-19 MED ORDER — METOCLOPRAMIDE HCL 5 MG/ML IJ SOLN: 5.0000 mg | Freq: Three times a day (TID) | INTRAMUSCULAR | Status: DC | PRN

## 2022-08-19 MED ORDER — METHOCARBAMOL 1000 MG/10ML IJ SOLN: 500.0000 mg | Freq: Four times a day (QID) | INTRAVENOUS | Status: DC | PRN

## 2022-08-19 MED ORDER — HYDROMORPHONE HCL 1 MG/ML IJ SOLN
INTRAMUSCULAR | Status: AC
  Administered 2022-08-19: 0.5 mg via INTRAVENOUS
  Filled 2022-08-19: qty 1

## 2022-08-19 MED ORDER — 0.9 % SODIUM CHLORIDE (POUR BTL) OPTIME
TOPICAL | Status: DC | PRN
  Administered 2022-08-19: 1000 mL

## 2022-08-19 MED ORDER — ONDANSETRON HCL 4 MG/2ML IJ SOLN
INTRAMUSCULAR | Status: DC | PRN
  Administered 2022-08-19: 4 mg via INTRAVENOUS

## 2022-08-19 MED ORDER — SENNA 8.6 MG PO TABS
1.0000 | ORAL_TABLET | Freq: Two times a day (BID) | ORAL | Status: DC
  Administered 2022-08-20 – 2022-08-22 (×5): 8.6 mg via ORAL
  Filled 2022-08-19 (×7): qty 1

## 2022-08-19 MED ORDER — PROPOFOL 1000 MG/100ML IV EMUL
INTRAVENOUS | Status: AC
  Filled 2022-08-19: qty 100

## 2022-08-19 MED ORDER — CHLORHEXIDINE GLUCONATE 0.12 % MT SOLN
15.0000 mL | Freq: Once | OROMUCOSAL | Status: AC
  Administered 2022-08-19: 15 mL via OROMUCOSAL

## 2022-08-19 MED ORDER — FENTANYL CITRATE (PF) 100 MCG/2ML IJ SOLN
INTRAMUSCULAR | Status: AC
  Filled 2022-08-19: qty 2

## 2022-08-19 MED ORDER — OXYCODONE HCL 5 MG PO TABS: 10.0000 mg | ORAL_TABLET | ORAL | Status: DC | PRN

## 2022-08-19 MED ORDER — MIDAZOLAM HCL 2 MG/2ML IJ SOLN
1.0000 mg | INTRAMUSCULAR | Status: DC
  Filled 2022-08-19: qty 2

## 2022-08-19 MED ORDER — TOBRAMYCIN SULFATE 1.2 G IJ SOLR
INTRAMUSCULAR | Status: AC
  Filled 2022-08-19: qty 2.4

## 2022-08-19 MED ORDER — DEXAMETHASONE SODIUM PHOSPHATE 10 MG/ML IJ SOLN
INTRAMUSCULAR | Status: AC
  Filled 2022-08-19: qty 1

## 2022-08-19 MED ORDER — TOBRAMYCIN SULFATE 1.2 G IJ SOLR
INTRAMUSCULAR | Status: DC | PRN
  Administered 2022-08-19: 14.4 g

## 2022-08-19 MED ORDER — TRANEXAMIC ACID-NACL 1000-0.7 MG/100ML-% IV SOLN
1000.0000 mg | INTRAVENOUS | Status: AC
  Administered 2022-08-19: 1000 mg via INTRAVENOUS
  Filled 2022-08-19: qty 100

## 2022-08-19 MED ORDER — ONDANSETRON HCL 4 MG/2ML IJ SOLN
INTRAMUSCULAR | Status: AC
  Filled 2022-08-19: qty 2

## 2022-08-19 MED ORDER — DOCUSATE SODIUM 100 MG PO CAPS
100.0000 mg | ORAL_CAPSULE | Freq: Two times a day (BID) | ORAL | Status: DC
  Administered 2022-08-19 – 2022-08-22 (×7): 100 mg via ORAL
  Filled 2022-08-19 (×7): qty 1

## 2022-08-19 MED ORDER — ROPIVACAINE HCL 5 MG/ML IJ SOLN
INTRAMUSCULAR | Status: DC | PRN
  Administered 2022-08-19: 30 mL via PERINEURAL

## 2022-08-19 MED ORDER — ACETAMINOPHEN 500 MG PO TABS
1000.0000 mg | ORAL_TABLET | Freq: Once | ORAL | Status: AC
  Administered 2022-08-19: 1000 mg via ORAL
  Filled 2022-08-19: qty 2

## 2022-08-19 MED ORDER — CEFAZOLIN SODIUM-DEXTROSE 2-4 GM/100ML-% IV SOLN
2.0000 g | INTRAVENOUS | Status: AC
  Administered 2022-08-19: 2 g via INTRAVENOUS
  Filled 2022-08-19: qty 100

## 2022-08-19 MED ORDER — PROMETHAZINE HCL 25 MG/ML IJ SOLN: 6.2500 mg | INTRAMUSCULAR | Status: DC | PRN

## 2022-08-19 MED ORDER — SODIUM CHLORIDE 0.9 % IR SOLN
Status: DC | PRN
  Administered 2022-08-19: 3000 mL

## 2022-08-19 MED ORDER — LIDOCAINE HCL (PF) 2 % IJ SOLN
INTRAMUSCULAR | Status: AC
  Filled 2022-08-19: qty 5

## 2022-08-19 MED ORDER — KETAMINE HCL 50 MG/5ML IJ SOSY
PREFILLED_SYRINGE | INTRAMUSCULAR | Status: AC
  Filled 2022-08-19: qty 5

## 2022-08-19 MED ORDER — PHENOL 1.4 % MT LIQD: 1.0000 | OROMUCOSAL | Status: DC | PRN

## 2022-08-19 MED ORDER — CELECOXIB 200 MG PO CAPS
200.0000 mg | ORAL_CAPSULE | Freq: Two times a day (BID) | ORAL | Status: DC
  Administered 2022-08-19 – 2022-08-22 (×7): 200 mg via ORAL
  Filled 2022-08-19 (×7): qty 1

## 2022-08-19 MED ORDER — POLYETHYLENE GLYCOL 3350 17 G PO PACK
17.0000 g | PACK | Freq: Every day | ORAL | Status: DC | PRN
  Filled 2022-08-19: qty 1

## 2022-08-19 MED ORDER — FENTANYL CITRATE (PF) 100 MCG/2ML IJ SOLN
INTRAMUSCULAR | Status: DC | PRN
  Administered 2022-08-19 (×4): 25 ug via INTRAVENOUS
  Administered 2022-08-19: 50 ug via INTRAVENOUS
  Administered 2022-08-19 (×2): 25 ug via INTRAVENOUS

## 2022-08-19 MED ORDER — PHENYLEPHRINE HCL-NACL 20-0.9 MG/250ML-% IV SOLN
INTRAVENOUS | Status: AC
  Filled 2022-08-19: qty 250

## 2022-08-19 MED ORDER — CEFAZOLIN SODIUM-DEXTROSE 2-4 GM/100ML-% IV SOLN
2.0000 g | Freq: Three times a day (TID) | INTRAVENOUS | Status: DC
  Administered 2022-08-19 – 2022-08-20 (×3): 2 g via INTRAVENOUS
  Filled 2022-08-19 (×3): qty 100

## 2022-08-19 MED ORDER — VANCOMYCIN HCL IN DEXTROSE 1-5 GM/200ML-% IV SOLN
1000.0000 mg | INTRAVENOUS | Status: AC
  Administered 2022-08-19: 1000 mg via INTRAVENOUS
  Filled 2022-08-19: qty 200

## 2022-08-19 MED ORDER — FENTANYL CITRATE PF 50 MCG/ML IJ SOSY
50.0000 ug | PREFILLED_SYRINGE | INTRAMUSCULAR | Status: DC
  Administered 2022-08-19: 50 ug via INTRAVENOUS
  Filled 2022-08-19: qty 2

## 2022-08-19 MED ORDER — METHOCARBAMOL 500 MG PO TABS
500.0000 mg | ORAL_TABLET | Freq: Four times a day (QID) | ORAL | Status: DC | PRN
  Administered 2022-08-20 – 2022-08-22 (×6): 500 mg via ORAL
  Filled 2022-08-19 (×6): qty 1

## 2022-08-19 MED ORDER — ONDANSETRON HCL 4 MG/2ML IJ SOLN
4.0000 mg | Freq: Four times a day (QID) | INTRAMUSCULAR | Status: DC | PRN
  Filled 2022-08-19: qty 2

## 2022-08-19 MED ORDER — POVIDONE-IODINE 10 % EX SWAB
2.0000 | Freq: Once | CUTANEOUS | Status: AC
  Administered 2022-08-19: 2 via TOPICAL

## 2022-08-19 MED ORDER — OXYCODONE HCL 5 MG PO TABS
5.0000 mg | ORAL_TABLET | ORAL | Status: DC | PRN
  Administered 2022-08-20 – 2022-08-22 (×7): 5 mg via ORAL
  Filled 2022-08-19: qty 2
  Filled 2022-08-19 (×7): qty 1

## 2022-08-19 MED ORDER — ALUM & MAG HYDROXIDE-SIMETH 200-200-20 MG/5ML PO SUSP: 30.0000 mL | ORAL | Status: DC | PRN

## 2022-08-19 MED ORDER — HYDROMORPHONE HCL 1 MG/ML IJ SOLN: 0.5000 mg | INTRAMUSCULAR | Status: DC | PRN

## 2022-08-19 MED ORDER — ONDANSETRON HCL 4 MG PO TABS
4.0000 mg | ORAL_TABLET | Freq: Four times a day (QID) | ORAL | Status: DC | PRN
  Administered 2022-08-20 (×2): 4 mg via ORAL
  Filled 2022-08-19 (×2): qty 1

## 2022-08-19 MED ORDER — SODIUM CHLORIDE 0.9 % IV SOLN: INTRAVENOUS | Status: DC

## 2022-08-19 MED ORDER — ISOPROPYL ALCOHOL 70 % SOLN
Status: DC | PRN
  Administered 2022-08-19: 1 via TOPICAL

## 2022-08-19 MED ORDER — DIPHENHYDRAMINE HCL 12.5 MG/5ML PO ELIX: 12.5000 mg | ORAL_SOLUTION | ORAL | Status: DC | PRN

## 2022-08-19 MED ORDER — BUPIVACAINE IN DEXTROSE 0.75-8.25 % IT SOLN
INTRATHECAL | Status: DC | PRN
  Administered 2022-08-19: 1.5 mL via INTRATHECAL

## 2022-08-19 MED ORDER — DEXAMETHASONE SODIUM PHOSPHATE 10 MG/ML IJ SOLN
INTRAMUSCULAR | Status: DC | PRN
  Administered 2022-08-19: 10 mg via INTRAVENOUS

## 2022-08-19 MED ORDER — ACETAMINOPHEN 325 MG PO TABS
325.0000 mg | ORAL_TABLET | Freq: Four times a day (QID) | ORAL | Status: DC | PRN
  Administered 2022-08-20 – 2022-08-22 (×4): 650 mg via ORAL
  Administered 2022-08-22: 325 mg via ORAL
  Filled 2022-08-19 (×5): qty 2

## 2022-08-19 MED ORDER — POVIDONE-IODINE 10 % EX SWAB: 2.0000 | Freq: Once | CUTANEOUS | Status: DC

## 2022-08-19 MED ORDER — CLONIDINE HCL (ANALGESIA) 100 MCG/ML EP SOLN
EPIDURAL | Status: DC | PRN
  Administered 2022-08-19: 80 ug

## 2022-08-19 MED ORDER — PROPOFOL 500 MG/50ML IV EMUL
INTRAVENOUS | Status: DC | PRN
  Administered 2022-08-19: 100 ug/kg/min via INTRAVENOUS

## 2022-08-19 MED ORDER — DEXAMETHASONE SODIUM PHOSPHATE 4 MG/ML IJ SOLN
INTRAMUSCULAR | Status: DC | PRN
  Administered 2022-08-19: 5 mg via PERINEURAL

## 2022-08-19 MED ORDER — TOBRAMYCIN SULFATE 1.2 G IJ SOLR
INTRAMUSCULAR | Status: AC
  Filled 2022-08-19: qty 12

## 2022-08-19 MED ORDER — MENTHOL 3 MG MT LOZG: 1.0000 | LOZENGE | OROMUCOSAL | Status: DC | PRN

## 2022-08-19 MED ORDER — ASPIRIN 81 MG PO CHEW
81.0000 mg | CHEWABLE_TABLET | Freq: Two times a day (BID) | ORAL | Status: DC
  Administered 2022-08-19 – 2022-08-22 (×7): 81 mg via ORAL
  Filled 2022-08-19 (×7): qty 1

## 2022-08-19 MED ORDER — METOCLOPRAMIDE HCL 5 MG PO TABS: 5.0000 mg | ORAL_TABLET | Freq: Three times a day (TID) | ORAL | Status: DC | PRN

## 2022-08-19 MED ORDER — PROPOFOL 10 MG/ML IV BOLUS
INTRAVENOUS | Status: DC | PRN
  Administered 2022-08-19: 110 mg via INTRAVENOUS
  Administered 2022-08-19: 20 mg via INTRAVENOUS
  Administered 2022-08-19: 50 mg via INTRAVENOUS

## 2022-08-19 MED ORDER — MEPERIDINE HCL 50 MG/ML IJ SOLN: 6.2500 mg | INTRAMUSCULAR | Status: DC | PRN

## 2022-08-19 MED ORDER — HYDROMORPHONE HCL 1 MG/ML IJ SOLN
0.2500 mg | INTRAMUSCULAR | Status: DC | PRN
  Administered 2022-08-19 (×2): 0.5 mg via INTRAVENOUS

## 2022-08-19 MED ORDER — LACTATED RINGERS IV SOLN: INTRAVENOUS | Status: DC

## 2022-08-19 MED ORDER — KETAMINE HCL 10 MG/ML IJ SOLN
INTRAMUSCULAR | Status: DC | PRN
  Administered 2022-08-19: 20 mg via INTRAVENOUS

## 2022-08-19 SURGICAL SUPPLY — 83 items
ADH SKN CLS APL DERMABOND .7 (GAUZE/BANDAGES/DRESSINGS) ×1
APL PRP STRL LF DISP 70% ISPRP (MISCELLANEOUS) ×2
BAG COUNTER SPONGE SURGICOUNT (BAG) ×1 IMPLANT
BAG SPEC THK2 15X12 ZIP CLS (MISCELLANEOUS) ×1
BAG SPNG CNTER NS LX DISP (BAG) ×1
BAG ZIPLOCK 12X15 (MISCELLANEOUS) ×1 IMPLANT
BLADE OSTEOTOME FLAT 10X5 (BLADE) IMPLANT
BLADE SAW RECIPROCATING 77.5 (BLADE) IMPLANT
BLADE SAW SGTL 18X1.27X75 (BLADE) IMPLANT
BLADE SAW SGTL 81X20 HD (BLADE) ×1 IMPLANT
BNDG ELASTIC 4X5.8 VLCR STR LF (GAUZE/BANDAGES/DRESSINGS) ×1 IMPLANT
BNDG ELASTIC 6X5.8 VLCR STR LF (GAUZE/BANDAGES/DRESSINGS) ×1 IMPLANT
BOWL SMART MIX CTS (DISPOSABLE) ×2 IMPLANT
BUR OVAL CARBIDE 4.0 (BURR) ×1 IMPLANT
CEMENT BONE R 1X40 (Cement) IMPLANT
CHLORAPREP W/TINT 26 (MISCELLANEOUS) ×2 IMPLANT
COVER SURGICAL LIGHT HANDLE (MISCELLANEOUS) ×1 IMPLANT
CUFF TOURN SGL QUICK 34 (TOURNIQUET CUFF) ×1
CUFF TRNQT CYL 34X4.125X (TOURNIQUET CUFF) ×1 IMPLANT
DERMABOND ADVANCED .7 DNX12 (GAUZE/BANDAGES/DRESSINGS) ×1 IMPLANT
DRAPE INCISE IOBAN 66X45 STRL (DRAPES) ×1 IMPLANT
DRAPE SHEET LG 3/4 BI-LAMINATE (DRAPES) ×3 IMPLANT
DRAPE U-SHAPE 47X51 STRL (DRAPES) ×1 IMPLANT
DRESSING PREVENA PLUS CUSTOM (GAUZE/BANDAGES/DRESSINGS) IMPLANT
DRSG AQUACEL AG ADV 3.5X10 (GAUZE/BANDAGES/DRESSINGS) ×1 IMPLANT
DRSG IV TEGADERM 3.5X4.5 STRL (GAUZE/BANDAGES/DRESSINGS) IMPLANT
DRSG PREVENA PLUS CUSTOM (GAUZE/BANDAGES/DRESSINGS) ×1
ELECT BLADE TIP CTD 4 INCH (ELECTRODE) ×1 IMPLANT
ELECT REM PT RETURN 15FT ADLT (MISCELLANEOUS) ×1 IMPLANT
EVACUATOR 1/8 PVC DRAIN (DRAIN) IMPLANT
EVACUATOR DRAINAGE 10X20 100CC (DRAIN) IMPLANT
EVACUATOR SILICONE 100CC (DRAIN)
GAUZE SPONGE 2X2 8PLY STRL LF (GAUZE/BANDAGES/DRESSINGS) IMPLANT
GAUZE SPONGE 4X4 12PLY STRL (GAUZE/BANDAGES/DRESSINGS) ×1 IMPLANT
GLOVE BIO SURGEON STRL SZ8.5 (GLOVE) ×2 IMPLANT
GLOVE BIOGEL M 7.0 STRL (GLOVE) ×1 IMPLANT
GLOVE BIOGEL PI IND STRL 7.0 (GLOVE) ×1 IMPLANT
GLOVE BIOGEL PI IND STRL 7.5 (GLOVE) ×1 IMPLANT
GLOVE BIOGEL PI IND STRL 8.5 (GLOVE) ×1 IMPLANT
GOWN SPEC L3 XXLG W/TWL (GOWN DISPOSABLE) ×1 IMPLANT
GOWN SPEC L4 XLG W/TWL (GOWN DISPOSABLE) ×1 IMPLANT
HANDPIECE INTERPULSE COAX TIP (DISPOSABLE) ×1
HDLS TROCR DRIL PIN KNEE 75 (PIN) ×1
HOOD PEEL AWAY T7 (MISCELLANEOUS) ×3 IMPLANT
IMMOBILIZER KNEE 20 (SOFTGOODS) ×1
IMMOBILIZER KNEE 20 THIGH 36 (SOFTGOODS) IMPLANT
JET LAVAGE IRRISEPT WOUND (IRRIGATION / IRRIGATOR) ×3
KIT TURNOVER KIT A (KITS) IMPLANT
LAVAGE JET IRRISEPT WOUND (IRRIGATION / IRRIGATOR) ×3 IMPLANT
MANIFOLD NEPTUNE II (INSTRUMENTS) ×1 IMPLANT
MARKER SKIN DUAL TIP RULER LAB (MISCELLANEOUS) ×1 IMPLANT
MOLD CEMENT FEMORAL 75MM (DISPOSABLE) IMPLANT
MOLD CEMENT TIBIAL 80MM (DISPOSABLE) IMPLANT
NDL SPNL 18GX3.5 QUINCKE PK (NEEDLE) ×1 IMPLANT
NEEDLE SPNL 18GX3.5 QUINCKE PK (NEEDLE) ×1 IMPLANT
NS IRRIG 1000ML POUR BTL (IV SOLUTION) ×2 IMPLANT
OSTEOTOME RETR 1/2 .313 WIDE (BLADE) IMPLANT
PACK TOTAL KNEE CUSTOM (KITS) ×1 IMPLANT
PADDING CAST COTTON 6X4 STRL (CAST SUPPLIES) ×1 IMPLANT
PIN DRILL HDLS TROCAR 75 4PK (PIN) IMPLANT
PROTECTOR NERVE ULNAR (MISCELLANEOUS) ×1 IMPLANT
SAW OSC TIP CART 19.5X105X1.3 (SAW) ×1 IMPLANT
SEALER BIPOLAR AQUA 6.0 (INSTRUMENTS) ×1 IMPLANT
SET HNDPC FAN SPRY TIP SCT (DISPOSABLE) ×1 IMPLANT
SET PAD KNEE POSITIONER (MISCELLANEOUS) ×1 IMPLANT
STAPLER VISISTAT 35W (STAPLE) IMPLANT
SUT MNCRL AB 3-0 PS2 18 (SUTURE) ×1 IMPLANT
SUT MNCRL AB 4-0 PS2 18 (SUTURE) ×1 IMPLANT
SUT MON AB 2-0 CT1 36 (SUTURE) ×1 IMPLANT
SUT PDS AB 1 CTX 36 (SUTURE) IMPLANT
SUT STRATAFIX PDO 1 14 VIOLET (SUTURE) ×1
SUT STRATFX PDO 1 14 VIOLET (SUTURE) ×1
SUT VIC AB 1 CTX 36 (SUTURE) ×2
SUT VIC AB 1 CTX36XBRD ANBCTR (SUTURE) ×2 IMPLANT
SUT VIC AB 2-0 CT1 27 (SUTURE) ×1
SUT VIC AB 2-0 CT1 TAPERPNT 27 (SUTURE) ×1 IMPLANT
SUTURE STRATFX PDO 1 14 VIOLET (SUTURE) ×1 IMPLANT
SWAB COLLECTION DEVICE MRSA (MISCELLANEOUS) IMPLANT
SWAB CULTURE ESWAB REG 1ML (MISCELLANEOUS) IMPLANT
TOWER CARTRIDGE SMART MIX (DISPOSABLE) IMPLANT
TRAY FOLEY MTR SLVR 16FR STAT (SET/KITS/TRAYS/PACK) ×1 IMPLANT
WATER STERILE IRR 1000ML POUR (IV SOLUTION) ×1 IMPLANT
WRAP KNEE MAXI GEL POST OP (GAUZE/BANDAGES/DRESSINGS) ×1 IMPLANT

## 2022-08-19 NOTE — Op Note (Signed)
OPERATIVE REPORT   08/19/2022  2:55 PM  PATIENT:  Bryan Carroll   SURGEON:  Bertram Savin, MD  ASSISTANT:  Larene Pickett, PA-C.   PREOPERATIVE DIAGNOSIS: Periprosthetic joint infection right knee.  POSTOPERATIVE DIAGNOSIS:  Same.  PROCEDURE:  1.  Resection right total knee arthroplasty including femoral, tibial, and patellar components. 2.  Placement of articulating antibiotic impregnated spacer. 3.  Application of negative pressure incisional dressing.  ANESTHESIA:   GETA.  ANTIBIOTICS: 2 g Ancef. 1 g vancomycin.  ESTIMATED BLOOD LOSS: 450 cc.  EXPLANTS: DePuy attune PS femoral component size 8. Attune rotating platform tibial tray size 9. 6 mm PS RP poly insert. 41 mm 3 peg patella.  IMPLANTS: Biomet stage I femoral spacer size 75 mm. Stage I tibial spacer size 80 mm. Biomet bone cement R with 2.4 g tobramycin per pack.  SPECIMENS: Tibial interface membrane for aerobic and anaerobic culture.  TUBES AND DRAINS: 1. Medium HV in the right knee joint. 2. 10 mm flat JP drain in the subcutaneous tissue.  COMPLICATIONS: None.  DISPOSITION: Stable to PACU.  SURGICAL INDICATIONS:  Bryan Carroll is a 72 y.o. male who underwent primary right total knee arthroplasty by Dr. Tonita Cong on 05/26/2017.  He did very well until this past summer when he developed pain and swelling in the knee.  His right knee was aspirated in the office yielding 4 8, 605 white blood cells with 96% neutrophils.  Neutrophil elastase positive.  Alpha defensin positive.  Culture grew strep viridans.  He was placed on oral Keflex.  I then saw him in the office and we discussed two-stage procedure as the gold standard treatment for his infection.  The risks, benefits, and alternatives were discussed with the patient. There are risks associated with the surgery including, but not limited to, problems with anesthesia (death), infection, instability (giving out of the joint), dislocation,  differences in leg length/angulation/rotation, fracture of bones, loosening or failure of implants, hematoma (blood accumulation) which may require surgical drainage, blood clots, pulmonary embolism, nerve injury (foot drop and lateral thigh numbness), and blood vessel injury. The patient understands these risks and elects to proceed.  PROCEDURE IN DETAIL: Patient was identified in the holding area using 2 identifiers.  The surgical site was marked by myself.  Regional block was placed by anesthesia team. He was taken to the operating room, placed supine on the operating table.  Spinal anesthesia was obtained.  Bump was placed under the right hip.  All bony prominences were well-padded. The right lower extremity was prepped and draped in the normal sterile surgical fashion.  Timeout was called, verifying site and site of surgery.  He did receive IV antibiotics within 60 minutes of beginning the procedure.  Gravity exsanguination was used, and the tourniquet was elevated to 320 mmHg.  I sharply excised his previous anterior knee incision.  Full-thickness skin flaps were created. Medial parapatellar arthrotomy was performed.  Upon entering the knee, there was seropurulent joint fluid.  Culture of the joint fluid was not sent, as we already had identified the organism.  The synovium was extremely thickened, consistent with chronic inflammation and infection.  A medial release was performed.  The knee was brought into extension, and a radical synovectomy was performed of the medial gutter, lateral gutter, and suprapatellar pouch.  Infrapatellar scar was sharply excised with Bovie electrocautery.  I then examined the knee replacement.  The femoral and tibial components were well fixed.  There was no significant osteolysis.  The patellar component was well fixed. Using an ACL saw blade, I disrupted the interface between the cement and the patellar component.  The pegs were removed with a TPS bur.  The cement mantle  was completely removed as well.  There was no significant osteolysis. The patellar remnant was intact without fracture.  The knee was then flexed.  Using an ACL saw blade, I disrupted the interface between the implant and cement mantle of the proximal tibia and distal femur.  I then used flexible osteotomes and the Shukla osteotomes.  I removed the tibial liner with an osteotome.  The femoral component was removed with the [facility] extractor.  There was no significant bone adherent to the femoral component.  There was no osteolysis.  I then removed the tibial implant with the [facility] extractor.  There was no significant osteolysis. A sample of the tibial interface membrane was sent for tissue culture. I gained entry to the proximal tibia with a entry drill bit. I gained entry to the distal femur with an entry drill bit. Free hand cuts were used to freshen the end of the femur.    And extra medullary guide was used to freshen the proximal tibial cut.  Once I was satisfied that all of the cement had been removed, I sized the tibia to size 80 mm.  The femur was sized to 75 mm.  I then performed a synovectomy of the posterior compartment.  The Vanguard trial femoral component was placed followed by the trial tibial tray.  A 16 mm liner was found to be appropriate.  The flexion and extension gaps were well-balanced and the knee was stable throughout a range of motion.  The trial components were then removed.  The knee was then irrigated with Irrisept followed by normal saline.  On the back table, cement was mixed with 2.4 g of tobramycin per pack.  The stage I femoral component was prepared on the back table.  The stage I femoral component was prepared with 16 mm of height.  I fashioned intramedullary dowels which were placed into the proximal tibia and distal femur.  The spacer was cemented into place.  The tourniquet was then let down.  Total tourniquet time was 99 minutes.  Axial compression was held until the cement  fully cured.  All excess cement was cleared.  Hemostasis was achieved with Bovie electrocautery and the Aquamantys.  The knee was then brought through a range of motion and stability was found to be adequate.   A medium Hemovac drain was placed within the lateral gutter.  The arthrotomy was closed with #1 PDS and #1 strata fix.  Deep dermal layer was closed with 2-0 Monocryl.  Skin was reapproximated with staples.  A Prevena negative pressure incisional dressing was applied and suction was hooked up to 75 mmHg without leak.  Bulky dressing was then placed followed by a knee immobilizer.  The patient was then awakened from anesthesia and taken to the PACU in stable condition.  Sponge, needle, and instrument counts were correct at the end of the case x2.  There were no known complications.   POSTOPERATIVE PLAN: Postoperatively, the patient will be admitted to the orthopedic floor.  Touchdown weightbearing right lower extremity with a walker.  Knee immobilizer to be worn while ambulating.  Continue IV Ancef for now.  A copy of his Synovasure analysis from the office was placed in the physical chart. Infectious disease consult will be placed tomorrow.  Discontinue Hemovac drain when  output is less than 30 cc per shift.  PICC line has been ordered.  The patient will undergo disposition planning, and be discharged when medically appropriate with long-term IV antibiotics. Upon discharge, the house wound VAC unit will be exchanged for a portable Prevena suction unit.  He will need to return to the office within 7 days of discharge for removal of the negative pressure dressing.

## 2022-08-19 NOTE — Anesthesia Procedure Notes (Signed)
Procedure Name: LMA Insertion Date/Time: 08/19/2022 12:20 PM  Performed by: Deliah Boston, CRNAPre-anesthesia Checklist: Patient identified, Emergency Drugs available, Suction available and Patient being monitored Patient Re-evaluated:Patient Re-evaluated prior to induction Oxygen Delivery Method: Circle system utilized Preoxygenation: Pre-oxygenation with 100% oxygen Induction Type: IV induction Ventilation: Mask ventilation without difficulty LMA: LMA inserted LMA Size: 5.0 Number of attempts: 1 Placement Confirmation: positive ETCO2 and breath sounds checked- equal and bilateral Tube secured with: Tape Dental Injury: Teeth and Oropharynx as per pre-operative assessment

## 2022-08-19 NOTE — Anesthesia Preprocedure Evaluation (Addendum)
Anesthesia Evaluation  Patient identified by MRN, date of birth, ID band Patient awake    Reviewed: Allergy & Precautions, NPO status , Patient's Chart, lab work & pertinent test results  Airway Mallampati: II  TM Distance: >3 FB Neck ROM: Full    Dental no notable dental hx. (+) Dental Advisory Given   Pulmonary neg pulmonary ROS   Pulmonary exam normal breath sounds clear to auscultation       Cardiovascular hypertension, Pt. on medications + Peripheral Vascular Disease  Normal cardiovascular exam+ dysrhythmias  Rhythm:Regular Rate:Normal     Neuro/Psych negative neurological ROS  negative psych ROS   GI/Hepatic hiatal hernia,GERD  Medicated and Controlled,,  Endo/Other  negative endocrine ROS    Renal/GU negative Renal ROS     Musculoskeletal  (+) Arthritis , Osteoarthritis,  OA left knee   Abdominal  (+) + obese  Peds  Hematology negative hematology ROS (+)   Anesthesia Other Findings   Reproductive/Obstetrics                             Anesthesia Physical Anesthesia Plan  ASA: 3  Anesthesia Plan: Spinal   Post-op Pain Management:  Regional for Post-op pain and Tylenol PO (pre-op)*   Induction: Intravenous  PONV Risk Score and Plan: 3 and Ondansetron, Dexamethasone, Midazolam, Propofol infusion and Treatment may vary due to age or medical condition  Airway Management Planned: Mask  Additional Equipment:   Intra-op Plan:   Post-operative Plan:   Informed Consent: I have reviewed the patients History and Physical, chart, labs and discussed the procedure including the risks, benefits and alternatives for the proposed anesthesia with the patient or authorized representative who has indicated his/her understanding and acceptance.     Dental advisory given  Plan Discussed with: CRNA  Anesthesia Plan Comments: ( Discussed spinal vs GA. Discussed risk of infection and  spinal anesthetic. Pt elects spinal anesthetic. )        Anesthesia Quick Evaluation

## 2022-08-19 NOTE — Interval H&P Note (Signed)
History and Physical Interval Note:  08/19/2022 9:48 AM  Bryan Carroll  has presented today for surgery, with the diagnosis of Right knee post joint infection.  The various methods of treatment have been discussed with the patient and family. After consideration of risks, benefits and other options for treatment, the patient has consented to  Procedure(s) with comments: EXCISIONAL TOTAL KNEE ARTHROPLASTY WITH ANTIBIOTIC SPACERS PLACEMENT (Right) - 240 as a surgical intervention.  [name] history has been reviewed, patient examined, no change in status, stable for surgery.  I have reviewed the patient's chart and labs.  Questions were answered to the patient's satisfaction.     Hilton Cork Balinda Heacock

## 2022-08-19 NOTE — Anesthesia Procedure Notes (Signed)
Procedure Name: MAC Date/Time: 08/19/2022 11:50 AM  Performed by: Deliah Boston, CRNAPre-anesthesia Checklist: Patient identified, Emergency Drugs available, Suction available and Patient being monitored Patient Re-evaluated:Patient Re-evaluated prior to induction Oxygen Delivery Method: Simple face mask Preoxygenation: Pre-oxygenation with 100% oxygen Placement Confirmation: positive ETCO2 and breath sounds checked- equal and bilateral Dental Injury: Teeth and Oropharynx as per pre-operative assessment

## 2022-08-19 NOTE — Transfer of Care (Signed)
Immediate Anesthesia Transfer of Care Note  Patient: Bryan Carroll  Procedure(s) Performed: EXCISIONAL TOTAL KNEE ARTHROPLASTY WITH ANTIBIOTIC SPACERS PLACEMENT (Right: Knee)  Patient Location: PACU  Anesthesia Type:General  Level of Consciousness: sedated  Airway & Oxygen Therapy: Patient Spontanous Breathing and Patient connected to face mask oxygen  Post-op Assessment: Report given to RN and Post -op Vital signs reviewed and stable  Post vital signs: Reviewed and stable  Last Vitals:  Vitals Value Taken Time  BP 120/73 08/19/22 1534  Temp    Pulse 78 08/19/22 1534  Resp 15 08/19/22 1535  SpO2 100 % 08/19/22 1534  Vitals shown include unvalidated device data.  Last Pain:  Vitals:   08/19/22 1040  TempSrc:   PainSc: 0-No pain         Complications: No notable events documented.

## 2022-08-19 NOTE — Anesthesia Procedure Notes (Addendum)
Anesthesia Regional Block: Adductor canal block   Pre-Anesthetic Checklist: , timeout performed,  Correct Patient, Correct Site, Correct Laterality,  Correct Procedure, Correct Position, site marked,  Risks and benefits discussed,  Surgical consent,  Pre-op evaluation,  At surgeon's request and post-op pain management  Laterality: Lower and Right  Prep: chloraprep       Needles:  Injection technique: Single-shot  Needle Type: Stimiplex     Needle Length: 9cm  Needle Gauge: 21     Additional Needles:   Procedures:,,,, ultrasound used (permanent image in chart),,    Narrative:  Start time: 08/19/2022 9:54 AM End time: 08/19/2022 10:14 AM Injection made incrementally with aspirations every 5 mL.  Performed by: Personally  Anesthesiologist: Nolon Nations, MD  Additional Notes: BP cuff, EKG monitors applied. Sedation begun. Artery and nerve location verified with ultrasound. Anesthetic injected incrementally (55ml), slowly, and after negative aspirations under direct u/s guidance. Good fascial/perineural spread. Tolerated well.

## 2022-08-19 NOTE — Anesthesia Procedure Notes (Signed)
Date/Time: 08/19/2022 3:25 PM  Performed by: Cynda Familia, CRNAOxygen Delivery Method: Simple face mask Placement Confirmation: breath sounds checked- equal and bilateral and positive ETCO2 Dental Injury: Teeth and Oropharynx as per pre-operative assessment

## 2022-08-19 NOTE — Progress Notes (Signed)
Spoke with primary RN Raquel Sarna in short stay, patient is for surgery and to be admitted. PICC insertion is not urgent as per RN since patient is for admission after surgery. Will follow up.

## 2022-08-19 NOTE — Anesthesia Procedure Notes (Addendum)
Spinal  Patient location during procedure: OR Start time: 08/19/2022 11:52 AM End time: 08/19/2022 11:57 AM Reason for block: surgical anesthesia Staffing Performed: anesthesiologist  Anesthesiologist: Nolon Nations, MD Performed by: Nolon Nations, MD Authorized by: Nolon Nations, MD   Preanesthetic Checklist Completed: patient identified, IV checked, site marked, risks and benefits discussed, surgical consent, monitors and equipment checked, pre-op evaluation and timeout performed Spinal Block Patient position: sitting Prep: DuraPrep and site prepped and draped Patient monitoring: heart rate, continuous pulse ox and blood pressure Approach: right paramedian Injection technique: single-shot Needle Needle type: Spinocan  Needle gauge: 25 G Needle length: 9 cm Additional Notes Expiration date of kit checked and confirmed. Patient tolerated procedure well, without complications.

## 2022-08-20 DIAGNOSIS — T8453XA Infection and inflammatory reaction due to internal right knee prosthesis, initial encounter: Secondary | ICD-10-CM | POA: Diagnosis not present

## 2022-08-20 LAB — CBC
HCT: 33.5 % — ABNORMAL LOW (ref 39.0–52.0)
Hemoglobin: 10.7 g/dL — ABNORMAL LOW (ref 13.0–17.0)
MCH: 27 pg (ref 26.0–34.0)
MCHC: 31.9 g/dL (ref 30.0–36.0)
MCV: 84.6 fL (ref 80.0–100.0)
Platelets: 240 10*3/uL (ref 150–400)
RBC: 3.96 MIL/uL — ABNORMAL LOW (ref 4.22–5.81)
RDW: 13.5 % (ref 11.5–15.5)
WBC: 8.8 10*3/uL (ref 4.0–10.5)
nRBC: 0 % (ref 0.0–0.2)

## 2022-08-20 LAB — BASIC METABOLIC PANEL
Anion gap: 8 (ref 5–15)
BUN: 10 mg/dL (ref 8–23)
CO2: 26 mmol/L (ref 22–32)
Calcium: 8.2 mg/dL — ABNORMAL LOW (ref 8.9–10.3)
Chloride: 102 mmol/L (ref 98–111)
Creatinine, Ser: 0.69 mg/dL (ref 0.61–1.24)
GFR, Estimated: >60 mL/min (ref >60)
Glucose, Bld: 143 mg/dL — ABNORMAL HIGH (ref 70–99)
Potassium: 4.2 mmol/L (ref 3.5–5.1)
Sodium: 136 mmol/L (ref 135–145)

## 2022-08-20 MED ORDER — CHLORHEXIDINE GLUCONATE CLOTH 2 % EX PADS
6.0000 | MEDICATED_PAD | Freq: Every day | CUTANEOUS | Status: DC
  Administered 2022-08-20 – 2022-08-22 (×3): 6 via TOPICAL

## 2022-08-20 MED ORDER — SODIUM CHLORIDE 0.9 % IV SOLN
2.0000 g | INTRAVENOUS | Status: DC
  Administered 2022-08-20 – 2022-08-22 (×3): 2 g via INTRAVENOUS
  Filled 2022-08-20 (×3): qty 20

## 2022-08-20 MED ORDER — SODIUM CHLORIDE 0.9% FLUSH
10.0000 mL | INTRAVENOUS | Status: DC | PRN
  Administered 2022-08-22: 10 mL

## 2022-08-20 NOTE — TOC Initial Note (Signed)
Transition of Care [facility]) - Initial/Assessment Note   Patient Details  Name: Bryan Carroll MRN: [id number] Date of Birth: 1951/04/29  Transition of Care [facility]) CM/SW Contact:    Sherie Don, LCSW Phone Number: 08/20/2022, 3:07 PM  Clinical Narrative: Patient will discharge home with IV antibiotics. PICC line has been placed. CSW met with patient and family to discuss discharge needs. Patient will need HHRN and home IV antibiotics set up as well as DME (rolling walker, 3N1). Patient and family aware that 3N1 will likely be private pay. CSW made referral to [facility] with [employer], which will provide the antibiotics at discharge. HHRN to be set up. DME referral made to [facility] with [place]. [employer] to deliver DME once orders have been placed.  Expected Discharge Plan: [place] Barriers to Discharge: Continued Medical Work up  Patient Goals and CMS Choice Patient states their goals for this hospitalization and ongoing recovery are:: Return home CMS Medicare.gov Compare Post Acute Care list provided to:: Patient Choice offered to / list presented to : Patient  Expected Discharge Plan and Services In-house Referral: Clinical Social Work Post Acute Care Choice: [place] arrangements for the past 2 months: [place]            DME Arranged: 3-N-1, Walker rolling DME Agency: [facility] [employer] Date DME Agency Contacted: 08/20/22 Representative spoke with at DME Agency: Darnelle Maffucci HH Arranged: RN, IV Antibiotics HH Agency: [employer] Date [place]: 08/20/22 Representative spoke with at [place]: Osborne Casco)  Prior Living Arrangements/Services Living arrangements for the past 2 months: Single Family Home Lives with:: Spouse Patient language and need for interpreter reviewed:: Yes Do you feel safe going back to the place where you live?: Yes      Need for Family Participation in Patient Care: No (Comment) Care giver support system in place?:  Yes (comment) Criminal Activity/Legal Involvement Pertinent to Current Situation/Hospitalization: No - Comment as needed  Activities of Daily Living Home Assistive Devices/Equipment: Eyeglasses ADL Screening (condition at time of admission) Patient's cognitive ability adequate to safely complete daily activities?: Yes Is the patient deaf or have difficulty hearing?: No Does the patient have difficulty seeing, even when wearing glasses/contacts?: No Does the patient have difficulty concentrating, remembering, or making decisions?: No Patient able to express need for assistance with ADLs?: Yes Does the patient have difficulty dressing or bathing?: No Independently performs ADLs?: Yes (appropriate for developmental age) Does the patient have difficulty walking or climbing stairs?: No Weakness of Legs: Right Weakness of Arms/Hands: None  Permission Sought/Granted Permission sought to share information with : Other (comment) Permission granted to share information with : Yes, Verbal Permission Granted Permission granted to share info w AGENCY: [place] [facility], [employer]  Emotional Assessment Appearance:: Appears stated age Attitude/Demeanor/Rapport: Engaged Affect (typically observed): Accepting Orientation: : Oriented to Self, Oriented to Place, Oriented to  Time, Oriented to Situation Alcohol / Substance Use: Not Applicable Psych Involvement: No (comment)  Admission diagnosis:  Infection of total right knee replacement, initial encounter [facility]) [T84.53XA] Patient Active Problem List   Diagnosis Date Noted   Infection of total right knee replacement ([place]) 08/19/2022   Infection of total right knee replacement, initial encounter ([place]) 08/19/2022   Primary osteoarthritis of right knee 05/26/2017   Right knee DJD 05/26/2017   PCP:  London Pepper, MD Pharmacy:   [place], [place] - [address] [place]  [place] [address] Phone:  [phone] Fax: [phone]  Social Determinants of Health (SDOH) Social History: SDOH Screenings   Food Insecurity: No Food Insecurity (08/19/2022)  Housing: Low Risk  (08/19/2022)  Transportation Needs: No Transportation Needs (08/19/2022)  Utilities: Not At Risk (08/19/2022)  Tobacco Use: Low Risk  (08/19/2022)   SDOH Interventions:    Readmission Risk Interventions     No data to display

## 2022-08-20 NOTE — Anesthesia Postprocedure Evaluation (Signed)
Anesthesia Post Note  Patient: Bryan Carroll  Procedure(s) Performed: EXCISIONAL TOTAL KNEE ARTHROPLASTY WITH ANTIBIOTIC SPACERS PLACEMENT (Right: Knee)     Patient location during evaluation: PACU Anesthesia Type: Spinal Level of consciousness: awake and alert Pain management: pain level controlled Vital Signs Assessment: post-procedure vital signs reviewed and stable Respiratory status: spontaneous breathing Cardiovascular status: stable Anesthetic complications: no   No notable events documented.  Last Vitals:  Vitals:   08/20/22 0904 08/20/22 1339  BP: 123/77 133/69  Pulse: 79 81  Resp: 18 18  Temp: (!) 36.4 C   SpO2: 99% 99%    Last Pain:  Vitals:   08/20/22 1415  TempSrc:   PainSc: [place]

## 2022-08-20 NOTE — Evaluation (Signed)
Physical Therapy Evaluation Patient Details Name: Bryan Carroll MRN: [id number] DOB: [birth year] Today's Date: 08/20/2022     Clinical Impression  Pt is POD 0 s/p resection of R TKA resulting in the deficits listed below (see PT Problem List). Pt performed sit to stand transfers with MIN guard for safety and cues for safe hand placement. Pt ambulated total of ~72ft with Min A and education on TTWB as well as KI at all times- educated on skin checks. Cues provided for step to gait patten with no LOB observed. Pt will have 24hr assist from multiple family members upon d/c. Pt will benefit from skilled PT to maximize functional mobility to increase independence.         Recommendations for follow up therapy are one component of a multi-disciplinary discharge planning process, led by the attending physician.  Recommendations may be updated based on patient status, additional functional criteria and insurance authorization.    08/20/22 1300  PT Visit Information  Last PT Received On 08/20/22  Assistance Needed +1  History of Present Illness Pt is a 72 y.o. 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 PMH significant for HTN, PVD, and R TKA (2018)  Precautions  Precautions Knee;Fall  Restrictions  Weight Bearing Restrictions Yes  RLE Weight Bearing TWB  Home Living  Family/patient expects to be discharged to: Private residence  Living Arrangements Spouse/significant other  Available Help at Discharge Family;Available 24 hours/day  Type of Home House  Home Access Stairs to enter  Entrance Stairs-Number of Steps 3  Entrance Stairs-Rails None  Home Layout Multi-level;Able to live on main level with bedroom/bathroom  Herbalist -  single point;Shower seat - built in  Additional Comments Will have assist from multiple family members  Prior Function  Prior Level of Function  Independent/Modified Independent;Driving  Communication  Communication No difficulties  Pain Assessment  Pain Assessment Faces  Faces Pain Scale 4  Pain Location R knee  Pain Descriptors / Indicators Sore  Pain Intervention(s) Limited activity within patient's tolerance;Monitored during session;Repositioned;Premedicated before session;Ice applied  Cognition  Arousal/Alertness Awake/alert  Behavior During Therapy WFL for tasks assessed/performed  Overall Cognitive Status Within Functional Limits for tasks assessed  Upper Extremity Assessment  Upper Extremity Assessment Overall WFL for tasks assessed  Lower Extremity Assessment  Lower Extremity Assessment Generalized weakness  Cervical / Trunk Assessment  Cervical / Trunk Assessment Normal  Bed Mobility  Overal bed mobility Needs Assistance  Bed Mobility Supine to Sit  Supine to sit Min assist;HOB elevated  General bed mobility comments assist for LE to EOB on R. Review of KI at all times and TTWB restrictions with demonstration with use of RW from PT.  Transfers  Overall transfer level Needs assistance  Equipment used Rolling walker (2 wheels)  Transfers Sit to/from Stand  Sit to Stand Min guard  General transfer comment cues for hand placement  Ambulation/Gait  Ambulation/Gait assistance Min assist  Gait Distance (Feet) 50 Feet  Assistive device Rolling walker (2 wheels)  Gait Pattern/deviations Step-to pattern;Decreased stride length;Decreased stance time - right  General Gait Details fluctuating between use of TTWB and NWB on R LE. Cues for increased use of WB through UEs to assist with maintaining restrictions.  Gait velocity decreased  Balance  Overall balance assessment Needs assistance  Sitting-balance support  No upper extremity supported;Feet supported  Sitting balance-Leahy  Scale Good  Standing balance support Bilateral upper extremity supported;During functional activity;Reliant on assistive device for balance  Standing balance-Leahy Scale Poor  Exercises  Exercises Total Joint  Total Joint Exercises  Ankle Circles/Pumps AROM;Both;20 reps;Seated  Quad Sets AROM;Right;10 reps;Seated  PT - End of Session  Equipment Utilized During Treatment Gait belt  Activity Tolerance Patient tolerated treatment well  Patient left in chair;with call bell/phone within reach;with chair alarm set;with family/visitor present  Nurse Communication Mobility status  PT Assessment  PT Recommendation/Assessment Patient needs continued PT services  PT Visit Diagnosis Unsteadiness on feet (R26.81);Muscle weakness (generalized) (M62.81);Pain  Pain - Right/Left Right  Pain - part of body Knee  PT Problem List Decreased strength;Decreased range of motion;Decreased activity tolerance;Decreased balance;Decreased mobility;Decreased knowledge of use of DME;Pain  PT Plan  PT Frequency (ACUTE ONLY) 7X/week  PT Treatment/Interventions (ACUTE ONLY) DME instruction;Gait training;Stair training;Functional mobility training;Therapeutic activities;Therapeutic exercise;Balance training;Patient/family education  AM-PAC PT "6 Clicks" Mobility Outcome Measure (Version 2)  Help needed turning from your back to your side while in a flat bed without using bedrails? 3  Help needed moving from lying on your back to sitting on the side of a flat bed without using bedrails? 3  Help needed moving to and from a bed to a chair (including a wheelchair)? 3  Help needed standing up from a chair using your arms (e.g., wheelchair or bedside chair)? 3  Help needed to walk in hospital room? 3  Help needed climbing 3-5 steps with a railing?  2  6 Click Score 17  Consider Recommendation of Discharge To: Home with [facility]  Progressive Mobility  What is the highest level of mobility based on the progressive mobility  assessment? Level 5 (Walks with assist in room/hall) - Balance while stepping forward/back and can walk in room with assist - Complete  Mobility Referral No  Activity Ambulated with assistance in hallway  PT Recommendation  Follow Up Recommendations Follow physician's recommendations for discharge plan and follow up therapies  Assistance recommended at discharge Frequent or constant Supervision/Assistance  Patient can return home with the following A little help with bathing/dressing/bathroom;A little help with walking and/or transfers;Help with stairs or ramp for entrance;Assist for transportation;Assistance with cooking/housework  Functional Status Assessment Patient has had a recent decline in their functional status and demonstrates the ability to make significant improvements in function in a reasonable and predictable amount of time.  PT equipment Rolling walker (2 wheels)  Individuals Consulted  Consulted and Agree with Results and Recommendations Patient;Family member/caregiver  Family Member Consulted son  Acute Rehab PT Goals  Patient Stated Goal Get home  PT Goal Formulation With patient/family  Time For Goal Achievement 09/03/22  Potential to Achieve Goals Good  PT Time Calculation  PT Start Time (ACUTE ONLY) 0939  PT Stop Time (ACUTE ONLY) 1021  PT Time Calculation (min) (ACUTE ONLY) 42 min  PT General Charges  $$ ACUTE PT VISIT 1 Visit  PT Evaluation  $PT Eval Low Complexity 1 Low  PT Treatments  $Therapeutic Activity 23-37 mins        Festus Barren PT, DPT  Acute Rehabilitation Services  Office [phone]  08/20/2022, 5:08 PM

## 2022-08-20 NOTE — Plan of Care (Signed)
  Problem: Education: Goal: Knowledge of the prescribed therapeutic regimen will improve Outcome: Progressing   Problem: Activity: Goal: Ability to avoid complications of mobility impairment will improve Outcome: Progressing Goal: Range of joint motion will improve Outcome: Progressing   Problem: Clinical Measurements: Goal: Postoperative complications will be avoided or minimized Outcome: Progressing   Problem: Pain Management: Goal: Pain level will decrease with appropriate interventions Outcome: Progressing   Problem: Skin Integrity: Goal: Will show signs of wound healing Outcome: Progressing   Problem: Education: Goal: Knowledge of General Education information will improve Description: Including pain rating scale, medication(s)/side effects and non-pharmacologic comfort measures Outcome: Progressing   Problem: Health Behavior/Discharge Planning: Goal: Ability to manage health-related needs will improve Outcome: Progressing   Problem: Clinical Measurements: Goal: Ability to maintain clinical measurements within normal limits will improve Outcome: Progressing Goal: Will remain free from infection Outcome: Progressing Goal: Diagnostic test results will improve Outcome: Progressing Goal: Respiratory complications will improve Outcome: Progressing Goal: Cardiovascular complication will be avoided Outcome: Progressing   Problem: Activity: Goal: Risk for activity intolerance will decrease Outcome: Progressing   Problem: Nutrition: Goal: Adequate nutrition will be maintained Outcome: Progressing   Problem: Coping: Goal: Level of anxiety will decrease Outcome: Progressing   Problem: Elimination: Goal: Will not experience complications related to bowel motility Outcome: Progressing Goal: Will not experience complications related to urinary retention Outcome: Progressing   Problem: Pain Managment: Goal: General experience of comfort will improve Outcome:  Progressing   Problem: Safety: Goal: Ability to remain free from injury will improve Outcome: Progressing   Problem: Skin Integrity: Goal: Risk for impaired skin integrity will decrease Outcome: Progressing

## 2022-08-20 NOTE — Plan of Care (Signed)
Plan of care reviewed and discussed. °

## 2022-08-20 NOTE — Consult Note (Signed)
Bryan Carroll for Infectious Disease    Date of Admission:  08/19/2022   Total days of inpatient antibiotics 18        Reason for Consult:     Principal Problem:   Infection of total right knee replacement ([name]) Active Problems:   Infection of total right knee replacement, initial encounter [facility])   Assessment: 72 year old male with history of right TKA on 05/26/2017 by Dr. Nyoka Cowden developed knee pain and swelling the summer.  Knee aspirate on 05/11/22 seen Dr. Sid Falcon office grew strep mitis/oralis, patient placed on Keflex.  Admitted for periprosthetic joint infection planned two-stage procedure.  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 2nd stage planned eventually.  - Pt reports he had knee some knee pain since TKA. Pain worsened in August when went oversees and walked quit  a bit. He has been on keflex since aspiration of knee  in October, on bid dosing for the past 2 weeks or so.  Recommendations:  -D/C vancomycin and cefazolin -Start ceftriaxone to target strep mitis/oralis. His Cx is re-incubating as such will continue to follow in case we may need to change abx. Anticipate 6 weeks of IV abx.    OPAT ORDERS:  Diagnosis: R knee pJI  Culture Result: strep mitis/oralis  Not on File   Discharge antibiotics to be given via PICC line:  Per pharmacy protocol ceftriaxone 2gm /day Aim for Vancomycin trough 15-20 or AUC 400-550 (unless otherwise indicated)   Duration: 6 weeks End Date: 2/29  [facility] Care Per Protocol with [employer] Use: Home health RN for IV administration and teaching, line care and labs.    Labs weekly while on IV antibiotics: _x_ CBC with differential _x_ CMP __x CRP __x ESR    __x Please leave PIC in place until doctor has seen patient or been notified  Fax weekly labs to [phone]  Clinic Follow Up  Appt: 2/5  @ [facility] with Dr. Candiss Norse    I have personally spent 80 minutes involved in face-to-face and non-face-to-face activities for this patient on the day of the visit. Professional time spent includes the following activities: Preparing to see the patient (review of tests), Obtaining and/or reviewing separately obtained history (admission/discharge record), Performing a medically appropriate examination and/or evaluation , Ordering medications/tests/procedures, referring and communicating with other health care professionals, Documenting clinical information in the EMR, Independently interpreting results (not separately reported), Communicating results to the patient/family/caregiver, Counseling and educating the patient/family/caregiver and Care coordination (not separately reported).   Microbiology:   Antibiotics: Cefazolin 1/18- Toramycin 14.4 Vancomycin 1/18  Cultures:  Other 1/18 strep mitis/oralis  HPI: Bryan Carroll is a 72 y.o. male 72 year old male with past medical history of  history of DVT, hypertension, PVD, arthritis status post right TKA on 05/26/2017 by Dr. Tonita Cong progressed well until this summer.  He developed pain and swelling at the knee.  His night knee was aspirated on 05/11/2022 with cultures growing strep mitis/oralis, placed on oral Keflex.  Seen by Dr. Lyla Glassing in his office and plan for two-stage procedure.  Taken to the OR on 1/19 for resection of right total knee arthroplasty, antibiotic spacer placement.  Per or report findings there were several purulent joint fluid upon entering the knee which was sent for cultures not growing strep mitis/oralis.  The synovium was extremely thickened consistent with chronic inflammation and  infection.  Infectious disease engaged for antibiotic recommendations.   Review of Systems: Review of Systems  All other systems reviewed and are negative.   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   Flexion contracture of knee, right    post op total knee 05-26-2017   GERD (gastroesophageal reflux disease)    History of colon polyps    History of DVT of lower extremity 06/13/2017   ED visit --  dvt right lower leg post op right total knee   Hypertension    Palpitations    per cardiologist note, dr Dietrich Pates, 2015--  24hr monitor show SB to ST w/  rare PVCs and rare PACs   Peripheral vascular disease (HCC)     Social History   Tobacco Use   Smoking status: Never   Smokeless tobacco: Never  Vaping Use   Vaping Use: Never used  Substance Use Topics   Alcohol use: Yes    Alcohol/week: 1.0 - 2.0 standard drink of alcohol    Types: 1 - 2 Glasses of wine per week    Comment: occasionally    Drug use: No    Family History  Problem Relation Age of Onset   Hypertension Mother    Heart disease Brother    Heart disease Brother    Scheduled Meds:  aspirin  81 mg Oral BID   celecoxib  200 mg Oral BID   Chlorhexidine Gluconate Cloth  6 each Topical Daily   docusate sodium  100 mg Oral BID   senna  1 tablet Oral BID   Continuous Infusions:  sodium chloride 30 mL/hr at 08/20/22 0510    ceFAZolin (ANCEF) IV 2 g (08/20/22 1411)   methocarbamol (ROBAXIN) IV     PRN Meds:.acetaminophen, alum & mag hydroxide-simeth, diphenhydrAMINE, HYDROmorphone (DILAUDID) injection, menthol-cetylpyridinium **OR** phenol, methocarbamol **OR** methocarbamol (ROBAXIN) IV, metoCLOPramide **OR** metoCLOPramide (REGLAN) injection, ondansetron **OR** ondansetron (ZOFRAN) IV, oxyCODONE, oxyCODONE, polyethylene glycol, sodium chloride flush Not on File  OBJECTIVE: Blood pressure 133/69, pulse 81, temperature (!) 97.5 F (36.4 C), temperature source Oral, resp. rate 18, height 5\' 11"  (1.803 m), weight 92 kg, SpO2 99 %.  Physical Exam Constitutional:      General: He is not in acute distress.    Appearance: He is normal weight. He is not toxic-appearing.   HENT:     Head: Normocephalic and atraumatic.     Right Ear: External ear normal.     Left Ear: External ear normal.     Nose: No congestion or rhinorrhea.     Mouth/Throat:     Mouth: Mucous membranes are moist.     Pharynx: Oropharynx is clear.  Eyes:     Extraocular Movements: Extraocular movements intact.     Conjunctiva/sclera: Conjunctivae normal.     Pupils: Pupils are equal, round, and reactive to light.  Cardiovascular:     Rate and Rhythm: Normal rate and regular rhythm.     Heart sounds: No murmur heard.    No friction rub. No gallop.  Pulmonary:     Effort: Pulmonary effort is normal.     Breath sounds: Normal breath sounds.  Abdominal:     General: Abdomen is flat. Bowel sounds are normal.     Palpations: Abdomen is soft.  Musculoskeletal:        General: No swelling.     Cervical back: Normal range of motion and neck  supple.  Skin:    General: Skin is warm and dry.  Neurological:     General: No focal deficit present.     Mental Status: He is oriented to person, place, and time.  Psychiatric:        Mood and Affect: Mood normal.     Lab Results Lab Results  Component Value Date   WBC 8.8 08/20/2022   HGB 10.7 (L) 08/20/2022   HCT 33.5 (L) 08/20/2022   MCV 84.6 08/20/2022   PLT 240 08/20/2022    Lab Results  Component Value Date   CREATININE 0.69 08/20/2022   BUN 10 08/20/2022   NA 136 08/20/2022   K 4.2 08/20/2022   CL 102 08/20/2022   CO2 26 08/20/2022   No results found for: "ALT", "AST", "GGT", "ALKPHOS", "BILITOT"     Laurice Record, [place] for Infectious Disease [facility] 08/20/2022, 2:48 PM

## 2022-08-20 NOTE — Progress Notes (Signed)
Peripherally Inserted Central Catheter Placement  The IV Nurse has discussed with the patient and/or persons authorized to consent for the patient, the purpose of this procedure and the potential benefits and risks involved with this procedure.  The benefits include less needle sticks, lab draws from the catheter, and the patient may be discharged home with the catheter. Risks include, but not limited to, infection, bleeding, blood clot (thrombus formation), and puncture of an artery; nerve damage and irregular heartbeat and possibility to perform a PICC exchange if needed/ordered by physician.  Alternatives to this procedure were also discussed.  Bard Power PICC patient education guide, fact sheet on infection prevention and patient information card has been provided to patient /or left at bedside.    PICC Placement Documentation  PICC Single Lumen 68/12/75 Right Basilic 39 cm 0 cm (Active)  Indication for Insertion or Continuance of Line Home intravenous therapies (PICC only) 08/20/22 1219  Exposed Catheter (cm) 0 cm 08/20/22 1219  Site Assessment Clean, Dry, Intact 08/20/22 1219  Line Status Flushed;Saline locked;Blood return noted 08/20/22 1219  Dressing Type Securing device;Transparent 08/20/22 1219  Dressing Status Antimicrobial disc in place 08/20/22 [place] Not Applicable 17/00/17 4944  Dressing Change Due 08/27/22 08/20/22 [place] 08/20/2022, 12:22 PM

## 2022-08-20 NOTE — Progress Notes (Signed)
Subjective:  Patient reports pain as mild to moderate.  Denies N/V/CP/SOB/Abd pain. He denies any tingling or numbness in LE bilaterally.   Daughter present during examination. All questions solicited and answered.   Objective:   VITALS:   Vitals:   08/20/22 0004 08/20/22 0213 08/20/22 0904 08/20/22 1339  BP: 125/73 133/80 123/77 133/69  Pulse: 79 82 79 81  Resp: 16 18 18 18   Temp: 99.4 F (37.4 C) 98 F (36.7 C) (!) 97.5 F (36.4 C)   TempSrc: Oral Oral Oral   SpO2: 93% 99% 99% 99%  Weight:      Height:        Patient lying in bed. NAD. Neurologically intact ABD soft Neurovascular intact Sensation intact distally Intact pulses distally Dorsiflexion/Plantar flexion intact No cellulitis present Compartment soft Prevena negative pressure dressing C/D/I. House vac 65mmHg. No leaks detected.  Hemovac drain: Dressing C/D/I. Output 140 cc yesterday.   Lab Results  Component Value Date   WBC 8.8 08/20/2022   HGB 10.7 (L) 08/20/2022   HCT 33.5 (L) 08/20/2022   MCV 84.6 08/20/2022   PLT 240 08/20/2022   BMET    Component Value Date/Time   NA 136 08/20/2022 0325   K 4.2 08/20/2022 0325   CL 102 08/20/2022 0325   CO2 26 08/20/2022 0325   GLUCOSE 143 (H) 08/20/2022 0325   BUN 10 08/20/2022 0325   CREATININE 0.69 08/20/2022 0325   CALCIUM 8.2 (L) 08/20/2022 0325   GFRNONAA >60 08/20/2022 0325   Results for orders placed or performed during the hospital encounter of 08/19/22  Aerobic/Anaerobic Culture w Gram Stain (surgical/deep wound)     Status: None (Preliminary result)   Collection Time: 08/19/22  1:43 PM   Specimen: Soft Tissue, Other  Result Value Ref Range Status   Specimen Description   Final    KNEE RIGHT Performed at [facility] [place] [address] [place], [place] [address]    Special Requests   Final    NONE Performed at [facility], [place] [address], [place], [place] [address]    Gram Stain NO WBC SEEN NO  ORGANISMS SEEN   Final   Culture   Final    FEW STREPTOCOCCUS MITIS/ORALIS CULTURE REINCUBATED FOR BETTER GROWTH Performed at [facility], [place] [address] [place], [place] [address]    Report Status PENDING  Incomplete     Assessment/Plan: 1 Day Post-Op   Principal Problem:   Infection of total right knee replacement ([place]) Active Problems:   Infection of total right knee replacement, initial encounter ([place])  Intraoperative cultures pending. Gram stain positive for streptococcus mitis.   TDWB with walker and Knee Immobilizer DVT ppx: Aspirin, SCDs, TEDS PO pain control PT/OT: PT has not seen yet.  Dispo:  - Continue IV Ancef for now.  A copy of his Synovasure analysis from the office was placed in the physical chart. Infectious disease consulted today.  Intaoperative cultures pending, continue to follow - Discontinue Hemovac drain when output is less than 30 cc per shift.   - PICC line has been ordered.   - The patient will undergo disposition planning, and be discharged when medically appropriate with long-term IV antibiotics.  - Upon discharge, the house wound VAC unit will be exchanged for a portable Prevena suction unit.  He will need to return to the office within 7 days of discharge for removal of the negative pressure dressing.    Charlott Rakes,  PA-C 08/20/2022, 2:57 PM   [facility]  [place] [address], [address], [place], [place] [address] Phone: [phone] [email or web address] Facebook  [employer]

## 2022-08-21 LAB — CBC
HCT: 31 % — ABNORMAL LOW (ref 39.0–52.0)
Hemoglobin: 9.7 g/dL — ABNORMAL LOW (ref 13.0–17.0)
MCH: 27.2 pg (ref 26.0–34.0)
MCHC: 31.3 g/dL (ref 30.0–36.0)
MCV: 87.1 fL (ref 80.0–100.0)
Platelets: 202 10*3/uL (ref 150–400)
RBC: 3.56 MIL/uL — ABNORMAL LOW (ref 4.22–5.81)
RDW: 13.7 % (ref 11.5–15.5)
WBC: 8.3 10*3/uL (ref 4.0–10.5)
nRBC: 0 % (ref 0.0–0.2)

## 2022-08-21 NOTE — Progress Notes (Signed)
Subjective:  Patient reports pain as mild to moderate.  Denies N/V/CP/SOB/Abd pain. He denies any tingling or numbness in LE bilaterally.   Daughter present during examination. All questions solicited and answered.   Objective:   VITALS:   Vitals:   08/20/22 0904 08/20/22 1339 08/20/22 2138 08/21/22 0519  BP: 123/77 133/69 132/63 132/63  Pulse: 79 81 96 84  Resp: 18 18 17 17   Temp: (!) 97.5 F (36.4 C)  98.3 F (36.8 C) 97.7 F (36.5 C)  TempSrc: Oral  Oral   SpO2: 99% 99% 97% 98%  Weight:      Height:        Patient lying in bed. NAD. Neurologically intact ABD soft Neurovascular intact Sensation intact distally Intact pulses distally Dorsiflexion/Plantar flexion intact No cellulitis present Compartment soft Prevena negative pressure dressing C/D/I. House vac 70mmHg. No leaks detected.  Hemovac drain: Dressing C/D/I. Output 25 cc last 12 hrs.   Lab Results  Component Value Date   WBC 8.3 08/21/2022   HGB 9.7 (L) 08/21/2022   HCT 31.0 (L) 08/21/2022   MCV 87.1 08/21/2022   PLT 202 08/21/2022   BMET    Component Value Date/Time   NA 136 08/20/2022 0325   K 4.2 08/20/2022 0325   CL 102 08/20/2022 0325   CO2 26 08/20/2022 0325   GLUCOSE 143 (H) 08/20/2022 0325   BUN 10 08/20/2022 0325   CREATININE 0.69 08/20/2022 0325   CALCIUM 8.2 (L) 08/20/2022 0325   GFRNONAA >60 08/20/2022 0325   Results for orders placed or performed during the hospital encounter of 08/19/22  Aerobic/Anaerobic Culture w Gram Stain (surgical/deep wound)     Status: None (Preliminary result)   Collection Time: 08/19/22  1:43 PM   Specimen: Soft Tissue, Other  Result Value Ref Range Status   Specimen Description   Final    KNEE RIGHT Performed at [facility], [place] [address], [place], [place] [address]    Special Requests   Final    NONE Performed at [facility], [place] [address], [place], [place] [address]    Gram Stain NO WBC SEEN NO  ORGANISMS SEEN   Final   Culture   Final    FEW STREPTOCOCCUS MITIS/ORALIS CULTURE REINCUBATED FOR BETTER GROWTH Performed at [facility], [place] [address] [place], [place] [address]    Report Status PENDING  Incomplete     Assessment/Plan: 2 Days Post-Op   Principal Problem:   Infection of total right knee replacement ([place]) Active Problems:   Infection of total right knee replacement, initial encounter ([place])  Intraoperative cultures pending. Gram stain positive for streptococcus mitis.   TDWB with walker and Knee Immobilizer DVT ppx: Aspirin, SCDs, TEDS PO pain control PT/OT: PT has not seen yet.  Dispo:  - Continue IV Ceftriaxone per ID.  A copy of his Synovasure analysis from the office was placed in the physical chart. Infectious disease consult on board, appreciate recs.  Intaoperative cultures pending, continue to follow - Discontinue Hemovac drain  - PICC line has been placed.   - D/C home after clears PT, needs long-term IV antibiotics  - Upon discharge, the house wound VAC unit will be exchanged for a portable Prevena suction unit.  He will need to return to the office within 7 days of discharge for removal of the negative pressure dressing.    Bertram Savin, MD 08/21/2022, 8:05 AM   [facility]  [place] [address], [address], [place], [place] [address] Phone: [phone] [email or web address] Facebook  [employer]

## 2022-08-21 NOTE — Progress Notes (Signed)
ID Brief Note     Component 2 d ago  Specimen Description KNEE RIGHT Performed at [facility] [address], [place], [place] [address]  Special Requests NONE Performed at [facility], [place] [address], [place], [place] [address]  Gram Stain NO WBC SEEN NO ORGANISMS SEEN  Culture FEW STREPTOCOCCUS MITIS/ORALIS SUSCEPTIBILITIES TO FOLLOW Performed at [facility], [place] [address] [place], [place] [address]  Report Status PENDING      Rosiland Oz, MD Infectious Disease Physician [facility] for Infectious Disease [address] [place], [place] [address] Phone: [phone]  Fax: [phone]

## 2022-08-21 NOTE — TOC Progression Note (Signed)
Transition of Care [facility]) - Progression Note   Patient Details  Name: Bryan Carroll MRN: [id number] Date of Birth: 1951-07-03  Transition of Care [facility]) CM/SW [place], LCSW Phone Number: 08/21/2022, 1:48 PM  Clinical Narrative: Patient is expected to discharge home tomorrow per patient and family. Patient and family agreeable to [facility] providing [facility]. DME has been delivered to patient's room. CSW made [facility] referral to Cindie with [facility], which was accepted. HHRN and OPAT orders will need to be placed/signed. CSW updated Pam with [facility] of expected discharge. Patient will discharge home to [place], [place] [address].  Expected Discharge Plan: [place] Barriers to Discharge: Continued Medical Work up  Expected Discharge Plan and Services In-house Referral: Clinical Social Work Post Acute Care Choice: [place] arrangements for the past 2 months: [place]             DME Arranged: 3-N-1, Walker rolling DME Agency: [employer] Date DME Agency Contacted: 08/20/22 Representative spoke with at DME Agency: [facility]: RN, IV Antibiotics HH Agency: [facility], [place] Date [place]: 08/21/22 Representative spoke with at [place]: Jeannene Patella (Ysidro Evert), Cindie Alvis Lemmings)  Social Determinants of Health (SDOH) Interventions SDOH Screenings   Food Insecurity: No Food Insecurity (08/19/2022)  Housing: Low Risk  (08/19/2022)  Transportation Needs: No Transportation Needs (08/19/2022)  Utilities: Not At Risk (08/19/2022)  Tobacco Use: Low Risk  (08/19/2022)   Readmission Risk Interventions     No data to display

## 2022-08-21 NOTE — Plan of Care (Signed)
  Problem: Education: Goal: Knowledge of General Education information will improve Description: Including pain rating scale, medication(s)/side effects and non-pharmacologic comfort measures Outcome: Progressing

## 2022-08-22 MED ORDER — OXYCODONE HCL 5 MG PO TABS: 5.0000 mg | ORAL_TABLET | ORAL | 0 refills | Status: DC | PRN

## 2022-08-22 MED ORDER — ASPIRIN 81 MG PO TBEC: 81.0000 mg | DELAYED_RELEASE_TABLET | Freq: Two times a day (BID) | ORAL | Status: AC

## 2022-08-22 MED ORDER — ACETAMINOPHEN 325 MG PO TABS: 325.0000 mg | ORAL_TABLET | Freq: Four times a day (QID) | ORAL | Status: AC | PRN

## 2022-08-22 MED ORDER — HEPARIN SOD (PORK) LOCK FLUSH 100 UNIT/ML IV SOLN
250.0000 [IU] | INTRAVENOUS | Status: AC | PRN
  Administered 2022-08-22: 250 [IU]

## 2022-08-22 MED ORDER — CEFTRIAXONE IV (FOR PTA / DISCHARGE USE ONLY): 2.0000 g | INTRAVENOUS | 0 refills | Status: AC

## 2022-08-22 NOTE — Progress Notes (Signed)
The patient is alert and oriented and has been seen by his physician. The orders for discharge were written. Peripheral IV has been removed. Prevena wound vac has been connected to the patient. Went over discharge instructions with patient and family. IV antibiotic is running now. IV team consult has been put in for them to flush and cap PICC line (after IV antibiotic is completed) before patient's discharge.

## 2022-08-22 NOTE — Progress Notes (Signed)
Physical Therapy Treatment Patient Details Name: Bryan Carroll MRN: [id number] DOB: 1951-02-11 Today's Date: 08/22/2022   History of Present Illness Pt is a 72 y.o. 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 PMH significant for HTN, PVD, and R TKA (2018)    PT Comments    Pt very cooperative and progressing well with mobility but requiring increased time for most tasks.  Pt up to ambulate in hall, negotiated stairs, performed limited therex program with written instruction provided and reviewed and reviewed car transfers.  Pt and dtrs with multiple questions asked and answered.  Recommendations for follow up therapy are one component of a multi-disciplinary discharge planning process, led by the attending physician.  Recommendations may be updated based on patient status, additional functional criteria and insurance authorization.  Follow Up Recommendations  Follow physician's recommendations for discharge plan and follow up therapies     Assistance Recommended at Discharge Frequent or constant Supervision/Assistance  Patient can return home with the following A little help with bathing/dressing/bathroom;A little help with walking and/or transfers;Help with stairs or ramp for entrance;Assist for transportation;Assistance with cooking/housework   Equipment Recommendations  Rolling walker (2 wheels)    Recommendations for Other Services       Precautions / Restrictions Precautions Precautions: Knee;Fall Restrictions Weight Bearing Restrictions: Yes RLE Weight Bearing: Touchdown weight bearing     Mobility  Bed Mobility Overal bed mobility: Needs Assistance Bed Mobility: Supine to Sit     Supine to sit: Supervision     General bed mobility comments:  Increased time but no physical assist    Transfers Overall transfer level: Needs assistance Equipment used: Rolling walker (2 wheels) Transfers: Sit to/from Stand Sit to Stand: Min guard, Supervision           General transfer comment: min cues for LE management and use of UEs to self assist    Ambulation/Gait Ambulation/Gait assistance: Min guard, Supervision Gait Distance (Feet): 55 Feet Assistive device: Rolling walker (2 wheels) Gait Pattern/deviations: Step-to pattern, Decreased stride length, Decreased stance time - right Gait velocity: decreased     General Gait Details: Min cues for posture and position from RW; pt demonstrating good follow through with TTWB   Stairs Stairs: Yes Stairs assistance: Min assist Stair Management: No rails, Step to pattern, Backwards, With walker Number of Stairs: 4 General stair comments: 2 steps twice bkwd with RW; cues for sequence; dtrs present for session   Wheelchair Mobility    Modified Rankin (Stroke Patients Only)       Balance Overall balance assessment: Needs assistance Sitting-balance support: No upper extremity supported, Feet supported Sitting balance-Leahy Scale: Good     Standing balance support: Bilateral upper extremity supported, During functional activity, Reliant on assistive device for balance Standing balance-Leahy Scale: Poor                              Cognition Arousal/Alertness: Awake/alert Behavior During Therapy: WFL for tasks assessed/performed Overall Cognitive Status: Within Functional Limits for tasks assessed                                          Exercises Total Joint Exercises Ankle Circles/Pumps: AROM, Both,  20 reps, Seated Quad Sets: AROM, Right, 10 reps, Seated Hip ABduction/ADduction: AAROM, Right, 10 reps, Supine Straight Leg Raises: AAROM, Right, 10 reps, Supine    General Comments        Pertinent Vitals/Pain Pain Assessment Pain  Assessment: 0-10 Pain Score: 4  Pain Location: R knee Pain Descriptors / Indicators: Sore Pain Intervention(s): Limited activity within patient's tolerance, Monitored during session, Premedicated before session, Ice applied    Home Living                          Prior Function            PT Goals (current goals can now be found in the care plan section) Acute Rehab PT Goals Patient Stated Goal: Get home PT Goal Formulation: With patient/family Time For Goal Achievement: 09/03/22 Potential to Achieve Goals: Good Progress towards PT goals: Progressing toward goals    Frequency    7X/week      PT Plan Current plan remains appropriate    Co-evaluation              AM-PAC PT "6 Clicks" Mobility   Outcome Measure  Help needed turning from your back to your side while in a flat bed without using bedrails?: A Little Help needed moving from lying on your back to sitting on the side of a flat bed without using bedrails?: A Little Help needed moving to and from a bed to a chair (including a wheelchair)?: A Little Help needed standing up from a chair using your arms (e.g., wheelchair or bedside chair)?: A Little Help needed to walk in hospital room?: A Little Help needed climbing 3-5 steps with a railing? : A Little 6 Click Score: 18    End of Session Equipment Utilized During Treatment: Gait belt;Right knee immobilizer Activity Tolerance: Patient tolerated treatment well Patient left: in chair;with call bell/phone within reach;with chair alarm set;with family/visitor present Nurse Communication: Mobility status PT Visit Diagnosis: Unsteadiness on feet (R26.81);Muscle weakness (generalized) (M62.81);Pain Pain - Right/Left: Right Pain - part of body: Knee     Time: 1310-1354 PT Time Calculation (min) (ACUTE ONLY): 44 min  Charges:  $Gait Training: 8-22 mins $Therapeutic Exercise: 8-22 mins $Therapeutic Activity: 8-22 mins                     Debe Coder PT Acute Rehabilitation Services Pager [phone] Office [phone]    Tajai Suder 08/22/2022, 4:16 PM

## 2022-08-22 NOTE — Progress Notes (Signed)
   Subjective:  Bryan Carroll is a 72 y.o. male, 3 Days Post-Op    s/p Procedure(s): EXCISIONAL TOTAL KNEE ARTHROPLASTY WITH ANTIBIOTIC SPACERS PLACEMENT   Patient reports pain as mild to moderate.  Doing ok , reports some pain. Reports PT or mobility team did not come  yesterday. Denies n/t.   Objective:   VITALS:   Vitals:   08/21/22 0519 08/21/22 1218 08/21/22 2211 08/22/22 0625  BP: 132/63 131/73 123/61 139/77  Pulse: 84 84 77 84  Resp: 17 18 17 17   Temp: 97.7 F (36.5 C) 98.3 F (36.8 C) 98.2 F (36.8 C) 97.8 F (36.6 C)  TempSrc:  Oral  Oral  SpO2: 98% 100% 99% 99%  Weight:      Height:       NAD , laying in hospital bed.  Neurovascular intact Sensation intact distally Intact pulses distally Dorsiflexion/Plantar flexion intact Incision: dressing C/D/I No cellulitis present Compartment soft Prevena present, no leaks, ace wrap clean. Mild bloody drainage at previous drain site. Mild swelling.   Lab Results  Component Value Date   WBC 8.3 08/21/2022   HGB 9.7 (L) 08/21/2022   HCT 31.0 (L) 08/21/2022   MCV 87.1 08/21/2022   PLT 202 08/21/2022   BMET    Component Value Date/Time   NA 136 08/20/2022 0325   K 4.2 08/20/2022 0325   CL 102 08/20/2022 0325   CO2 26 08/20/2022 0325   GLUCOSE 143 (H) 08/20/2022 0325   BUN 10 08/20/2022 0325   CREATININE 0.69 08/20/2022 0325   CALCIUM 8.2 (L) 08/20/2022 0325   GFRNONAA >60 08/20/2022 0325     Assessment/Plan: 3 Days Post-Op   Principal Problem:   Infection of total right knee replacement (HCC) Active Problems:   Infection of total right knee replacement, initial encounter ([place])   Advance diet Up with therapy Dispo: Likely D/C tomorrow . Antibiotic home orders placed for ceftriaxone 2g q24, HHRN orders placed to arrange for PICC line nurse   TDWB with walker and Knee Immobilizer DVT ppx: Aspirin, SCDs, TEDS PO pain control PT/OT: work on stair training  Dispo:  - Continue IV Ceftriaxone -   - PICC line has been placed.   - D/C home after clears PT, needs long-term IV antibiotics  - Upon discharge, the house wound VAC unit will be exchanged for a portable Prevena suction unit.  He will need to return to the office within 7 days of discharge for removal of the negative pressure dressing.    Faythe Casa 08/22/2022, 9:38 AM  Jonelle Sidle PA-C  Physician Assistant with Dr. Lillia Abed [place]

## 2022-08-22 NOTE — Progress Notes (Signed)
ID brief note  Component 3 d ago  Specimen Description KNEE RIGHT Performed at [facility] [address], [place], [place] [address]  Special Requests NONE Performed at [facility], [place] [address], [place], [place] [address]  Gram Stain NO WBC SEEN NO ORGANISMS SEEN Performed at [facility], [place] [address] [place], [place] [address]  Culture FEW STREPTOCOCCUS MITIS/ORALIS SUSCEPTIBILITIES TO FOLLOW NO ANAEROBES ISOLATED; CULTURE IN PROGRESS FOR 5 DAYS  Report Status PENDING  Resulting Agency [facility]   OPAT note in from 1/19 by Dr Candiss Norse   Rosiland Oz, MD Infectious Disease Physician [facility] [address] [place], [place] [address] Phone: [phone]  Fax: [phone]

## 2022-08-22 NOTE — Discharge Instructions (Signed)
Orthopedic discharge instructions:  Maintain your knee immobilizer at all times except for icing, adjusting for dressings.  You will be switched to a portable wound vacuum device, this will work for about 7 days until automatically shut off at 7 days.  You are to return to the office in approximately 6 to 7 days to remove the wound VAC dressing.  No showering at this time.  While ambulating he will be touchdown weightbearing meaning just using the toes or base of the foot for balance while maintaining the knee immobilizer on your surgical leg.  Use a walker or crutches to help with moving around.  For pain control take Tylenol 1000mg  every 6 hours and ibuprofen or Aleve as  directed on the bottle  as needed for pain.  For moderate to severe pain to take the oxycodone.  For medication refills or questions call the office at [phone].  Also call the office on Monday to make an appointment with Dr. Lyla Glassing for wound VAC removal.  You should take 81 mg of aspirin twice a day for the next 6 weeks for blood clot prevention.''  Has been arranged to have a home health nurse help manage your PICC line and administration of IV antibiotics.  Take the IV antibiotics once a day as recommended.

## 2022-08-22 NOTE — Progress Notes (Signed)
PHARMACY CONSULT NOTE FOR:  OUTPATIENT  PARENTERAL ANTIBIOTIC THERAPY (OPAT)  Indication: R knee PJI  Regimen: Ceftriaxone 2 gr IV q24h  End date: 09/30/22   IV antibiotic discharge orders are pended. To discharging provider:  please sign these orders via discharge navigator,  Select New Orders & click on the button choice - Manage This Unsigned Work.     Thank you for allowing pharmacy to be a part of this patient's care.   Royetta Asal, PharmD, BCPS 08/22/2022 9:37 AM

## 2022-08-22 NOTE — TOC Progression Note (Signed)
Transition of Care [facility]) - Progression Note    Patient Details  Name: Bryan Carroll MRN: [id number] Date of Birth: 12-01-1950  Transition of Care [facility]) CM/SW [place], [place] Phone Number: 08/22/2022, 10:13 AM  Clinical Narrative:     CSW notified by MD that pt may dc tomorrow, CSW sent Carolynn Sayers a message advising change in dc date.    Expected Discharge Plan: [place] Barriers to Discharge: Continued Medical Work up  Expected Discharge Plan and Services In-house Referral: Clinical Social Work   Post Acute Care Choice: [place] arrangements for the past 2 months: [place]                 DME Arranged: 3-N-1, Walker rolling DME Agency: [employer] Date DME Agency Contacted: 08/20/22   Representative spoke with at DME Agency: [place]: RN, IV Antibiotics HH Agency: [facility], [place] Date [place]: 08/21/22   Representative spoke with at [place]: Jeannene Patella (Ysidro Evert), Cindie Alvis [place])   Social Determinants of Health (SDOH) Interventions SDOH Screenings   Food Insecurity: No Food Insecurity (08/19/2022)  Housing: Low Risk  (08/19/2022)  Transportation Needs: No Transportation Needs (08/19/2022)  Utilities: Not At Risk (08/19/2022)  Tobacco Use: Low Risk  (08/19/2022)    Readmission Risk Interventions     No data to display

## 2022-08-22 NOTE — Progress Notes (Signed)
Patient and family is asking about a PT session with stairs. Reached out to PT via [place].

## 2022-08-22 NOTE — Plan of Care (Signed)
  Problem: Education: Goal: Knowledge of General Education information will improve Description: Including pain rating scale, medication(s)/side effects and non-pharmacologic comfort measures Outcome: Progressing

## 2022-08-23 DIAGNOSIS — Z792 Long term (current) use of antibiotics: Secondary | ICD-10-CM | POA: Diagnosis not present

## 2022-08-23 DIAGNOSIS — T8453XA Infection and inflammatory reaction due to internal right knee prosthesis, initial encounter: Secondary | ICD-10-CM | POA: Diagnosis not present

## 2022-08-23 DIAGNOSIS — B950 Streptococcus, group A, as the cause of diseases classified elsewhere: Secondary | ICD-10-CM | POA: Diagnosis not present

## 2022-08-23 DIAGNOSIS — Z452 Encounter for adjustment and management of vascular access device: Secondary | ICD-10-CM | POA: Diagnosis not present

## 2022-08-23 DIAGNOSIS — K219 Gastro-esophageal reflux disease without esophagitis: Secondary | ICD-10-CM | POA: Diagnosis not present

## 2022-08-23 DIAGNOSIS — I10 Essential (primary) hypertension: Secondary | ICD-10-CM | POA: Diagnosis not present

## 2022-08-23 DIAGNOSIS — I739 Peripheral vascular disease, unspecified: Secondary | ICD-10-CM | POA: Diagnosis not present

## 2022-08-23 DIAGNOSIS — Z7982 Long term (current) use of aspirin: Secondary | ICD-10-CM | POA: Diagnosis not present

## 2022-08-23 DIAGNOSIS — Z9181 History of falling: Secondary | ICD-10-CM | POA: Diagnosis not present

## 2022-08-23 DIAGNOSIS — Z86718 Personal history of other venous thrombosis and embolism: Secondary | ICD-10-CM | POA: Diagnosis not present

## 2022-08-23 DIAGNOSIS — M009 Pyogenic arthritis, unspecified: Secondary | ICD-10-CM | POA: Diagnosis not present

## 2022-08-24 ENCOUNTER — Encounter (HOSPITAL_COMMUNITY): Payer: Self-pay | Admitting: Orthopedic Surgery

## 2022-08-24 DIAGNOSIS — Z5181 Encounter for therapeutic drug level monitoring: Secondary | ICD-10-CM | POA: Diagnosis not present

## 2022-08-24 DIAGNOSIS — Z7982 Long term (current) use of aspirin: Secondary | ICD-10-CM | POA: Diagnosis not present

## 2022-08-24 DIAGNOSIS — B950 Streptococcus, group A, as the cause of diseases classified elsewhere: Secondary | ICD-10-CM | POA: Diagnosis not present

## 2022-08-24 DIAGNOSIS — T8453XA Infection and inflammatory reaction due to internal right knee prosthesis, initial encounter: Secondary | ICD-10-CM | POA: Diagnosis not present

## 2022-08-24 DIAGNOSIS — I739 Peripheral vascular disease, unspecified: Secondary | ICD-10-CM | POA: Diagnosis not present

## 2022-08-24 DIAGNOSIS — I10 Essential (primary) hypertension: Secondary | ICD-10-CM | POA: Diagnosis not present

## 2022-08-24 DIAGNOSIS — Z452 Encounter for adjustment and management of vascular access device: Secondary | ICD-10-CM | POA: Diagnosis not present

## 2022-08-24 DIAGNOSIS — K219 Gastro-esophageal reflux disease without esophagitis: Secondary | ICD-10-CM | POA: Diagnosis not present

## 2022-08-24 DIAGNOSIS — Z9181 History of falling: Secondary | ICD-10-CM | POA: Diagnosis not present

## 2022-08-24 DIAGNOSIS — Z792 Long term (current) use of antibiotics: Secondary | ICD-10-CM | POA: Diagnosis not present

## 2022-08-24 DIAGNOSIS — M009 Pyogenic arthritis, unspecified: Secondary | ICD-10-CM | POA: Diagnosis not present

## 2022-08-24 DIAGNOSIS — Z86718 Personal history of other venous thrombosis and embolism: Secondary | ICD-10-CM | POA: Diagnosis not present

## 2022-08-24 LAB — AEROBIC/ANAEROBIC CULTURE W GRAM STAIN (SURGICAL/DEEP WOUND): Gram Stain: NONE SEEN

## 2022-08-25 DIAGNOSIS — M009 Pyogenic arthritis, unspecified: Secondary | ICD-10-CM | POA: Diagnosis not present

## 2022-08-25 DIAGNOSIS — T8453XA Infection and inflammatory reaction due to internal right knee prosthesis, initial encounter: Secondary | ICD-10-CM | POA: Diagnosis not present

## 2022-08-27 NOTE — Discharge Summary (Signed)
Physician Discharge Summary  Patient ID: Bryan Carroll MRN: [id number] DOB/AGE: [birth year] 72 y.o.  Admit date: 08/19/2022 Discharge date: 08/22/2022  Admission Diagnoses:  Infection of total right knee replacement [facility])  Discharge Diagnoses:  Principal Problem:   Infection of total right knee replacement (HCC) Active Problems:   Infection of total right knee replacement, initial encounter [facility])   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   Flexion contracture of knee, right    post op total knee 05-26-2017   GERD (gastroesophageal reflux disease)    History of colon polyps    History of DVT of lower extremity 06/13/2017   ED visit --  dvt right lower leg post op right total knee   Hypertension    Palpitations    per cardiologist note, dr Dietrich Pates, 2015--  24hr monitor show SB to ST w/  rare PVCs and rare PACs   Peripheral vascular disease (HCC)     Surgeries: Procedure(s): EXCISIONAL TOTAL KNEE ARTHROPLASTY WITH ANTIBIOTIC SPACERS PLACEMENT on 08/19/2022   Consultants (if any):   Discharged Condition: Improved  Hospital Course: Bryan Carroll is an 72 y.o. male who was admitted 08/19/2022 with a diagnosis of Infection of total right knee replacement (HCC) and went to the operating room on 08/19/2022 and underwent the above named procedures.    He was given perioperative antibiotics:  Anti-infectives (From admission, onward)    Start     Dose/Rate Route Frequency Ordered Stop   08/22/22 0000  cefTRIAXone (ROCEPHIN) IVPB        2 g Intravenous Every 24 hours 08/22/22 0944 09/30/22 2359   08/20/22 2200  cefTRIAXone (ROCEPHIN) 2 g in sodium chloride 0.9 % 100 mL IVPB  Status:  Discontinued        2 g 200 mL/hr over 30 Minutes Intravenous Every 24 hours 08/20/22 1605 08/23/22 0122   08/19/22 2000  ceFAZolin (ANCEF) IVPB 2g/100 mL premix  Status:  Discontinued        2 g 200 mL/hr over 30  Minutes Intravenous Every 8 hours 08/19/22 1808 08/20/22 1605   08/19/22 1353  tobramycin (NEBCIN) powder  Status:  Discontinued          As needed 08/19/22 1354 08/19/22 1807   08/19/22 0815  ceFAZolin (ANCEF) IVPB 2g/100 mL premix        2 g 200 mL/hr over 30 Minutes Intravenous On call to O.R. 08/19/22 0804 08/19/22 1157   08/19/22 0815  vancomycin (VANCOCIN) IVPB 1000 mg/200 mL premix        1,000 mg 200 mL/hr over 60 Minutes Intravenous On call to O.R. 08/19/22 0804 08/19/22 0957       He was given sequential compression devices, early ambulation, and aspirin for DVT prophylaxis.  POD#1 Patient ambulated 50 feet with PT maintaining TDWB in KI. IV Ceftriaxone per ID.  A copy of his Synovasure analysis from the office was placed in the physical chart. Infectious disease consult on board, appreciate recs.  Intaoperative cultures pending, continue to follow Hemovac drain 140cc output.  PICC line has been ordered. Prevena negative pressure dressing C/D/I. House vac . No leaks detected  POD#2 IV ceftriaxone continued. PICC line placed. Intraoperative cultures still pending. Hemovac drain: Dressing C/D/I. Output 25 cc last 12 hrs, and discontinued. Prevena negative pressure dressing C/D/I. House vac . No leaks detected. PT did not see this day.  POD#3 IV ceftriaxone  continued. PICC line in place. Prevena present, no leaks, ace wrap clean. Mild bloody drainage at previous drain site. He cleared PT. The house wound VAC unit was exchanged for a portable Prevena suction unit.  He will need to return to the office within 7 days of discharge for removal of the negative pressure dressing.  Patient discharged.       He benefited maximally from the hospital stay and there were no complications.    Recent vital signs:  Vitals:   08/22/22 0625 08/22/22 1405  BP: 139/77 (!) 148/74  Pulse: 84 85  Resp: 17 16  Temp: 97.8 F (36.6 C) (!) 97.4 F (36.3 C)  SpO2: 99% 100%    Recent  laboratory studies:  Lab Results  Component Value Date   HGB 9.7 (L) 08/21/2022   HGB 10.7 (L) 08/20/2022   HGB 14.7 08/10/2022   Lab Results  Component Value Date   WBC 8.3 08/21/2022   PLT 202 08/21/2022   Lab Results  Component Value Date   INR 0.91 05/20/2017   Lab Results  Component Value Date   NA 136 08/20/2022   K 4.2 08/20/2022   CL 102 08/20/2022   CO2 26 08/20/2022   BUN 10 08/20/2022   CREATININE 0.69 08/20/2022   GLUCOSE 143 (H) 08/20/2022     Allergies as of 08/22/2022   Not on File      Medication List     STOP taking these medications    cephALEXin 500 MG capsule Commonly known as: KEFLEX       TAKE these medications    acetaminophen 325 MG tablet Commonly known as: TYLENOL Take 1-2 tablets (325-650 mg total) by mouth every 6 (six) hours as needed for mild pain (pain score 1-3 or temp > 100.5).   aspirin EC 81 MG tablet Take 1 tablet (81 mg total) by mouth 2 (two) times daily. Swallow whole. What changed: when to take this   cefTRIAXone  IVPB Commonly known as: ROCEPHIN Inject 2 g into the vein daily. Indication:  R knee PJI First Dose: No Last Day of Therapy:  09/30/22 Labs - Once weekly:  CBC/D and BMP, Labs - Every other week:  ESR and CRP Method of administration: IV Push Method of administration may be changed at the discretion of home infusion pharmacist based upon assessment of the patient and/or caregiver's ability to self-administer the medication ordered.   lisinopril 20 MG tablet Commonly known as: ZESTRIL Take 20 mg by mouth every morning.   MULTIVITAMIN ADULT PO Take 1 tablet by mouth daily.   oxyCODONE 5 MG immediate release tablet Commonly known as: Oxy IR/ROXICODONE Take 1 tablet (5 mg total) by mouth every 4 (four) hours as needed for moderate pain or severe pain.               Discharge Care Instructions  (From admission, onward)           Start     Ordered   08/22/22 0000  Change dressing on IV  access line weekly and PRN  (Home infusion instructions - [facility] )        08/22/22 0944              WEIGHT BEARING   Other:  TDWB with walker and Knee Immobilizer.    EXERCISES  Results after joint replacement surgery are often greatly improved when you follow the exercise, range of motion and muscle strengthening exercises prescribed by your doctor.  Safety measures are also important to protect the joint from further injury. Any time any of these exercises cause you to have increased pain or swelling, decrease what you are doing until you are comfortable again and then slowly increase them. If you have problems or questions, call your caregiver or physical therapist for advice.   Rehabilitation is important following a joint replacement. After just a few days of immobilization, the muscles of the leg can become weakened and shrink (atrophy).  These exercises are designed to build up the tone and strength of the thigh and leg muscles and to improve motion. Often times heat used for twenty to thirty minutes before working out will loosen up your tissues and help with improving the range of motion but do not use heat for the first two weeks following surgery (sometimes heat can increase post-operative swelling).   These exercises can be done on a training (exercise) mat, on the floor, on a table or on a bed. Use whatever works the best and is most comfortable for you.    Use music or television while you are exercising so that the exercises are a pleasant break in your day. This will make your life better with the exercises acting as a break in your routine that you can look forward to.   Perform all exercises about fifteen times, three times per day or as directed.  You should exercise both the operative leg and the other leg as well.  Exercises include:   Quad Sets - Tighten up the muscle on the front of the thigh (Quad) and hold for 5-10 seconds.   Straight Leg Raises -  With your knee straight (if you were given a brace, keep it on), lift the leg to 60 degrees, hold for 3 seconds, and slowly lower the leg.  Perform this exercise against resistance later as your leg gets stronger.  Leg Slides: Lying on your back, slowly slide your foot toward your buttocks, bending your knee up off the floor (only go as far as is comfortable). Then slowly slide your foot back down until your leg is flat on the floor again.  Angel Wings: Lying on your back spread your legs to the side as far apart as you can without causing discomfort.  Hamstring Strength:  Lying on your back, push your heel against the floor with your leg straight by tightening up the muscles of your buttocks.  Repeat, but this time bend your knee to a comfortable angle, and push your heel against the floor.  You may put a pillow under the heel to make it more comfortable if necessary.   A rehabilitation program following joint replacement surgery can speed recovery and prevent re-injury in the future due to weakened muscles. Contact your doctor or a physical therapist for more information on knee rehabilitation.    CONSTIPATION  Constipation is defined medically as fewer than three stools per week and severe constipation as less than one stool per week.  Even if you have a regular bowel pattern at home, your normal regimen is likely to be disrupted due to multiple reasons following surgery.  Combination of anesthesia, postoperative narcotics, change in appetite and fluid intake all can affect your bowels.   YOU MUST use at least one of the following options; they are listed in order of increasing strength to get the job done.  They are all available over the counter, and you may need to use some, POSSIBLY even all of these options:  Drink plenty of fluids (prune juice may be helpful) and high fiber foods Colace 100 mg by mouth twice a day  Senokot for constipation as directed and as needed Dulcolax (bisacodyl),  take with full glass of water  Miralax (polyethylene glycol) once or twice a day as needed.  If you have tried all these things and are unable to have a bowel movement in the first 3-4 days after surgery call either your surgeon or your primary doctor.    If you experience loose stools or diarrhea, hold the medications until you stool forms back up.  If your symptoms do not get better within 1 week or if they get worse, check with your doctor.  If you experience "the worst abdominal pain ever" or develop nausea or vomiting, please contact the office immediately for further recommendations for treatment.   ITCHING:  If you experience itching with your medications, try taking only a single pain pill, or even half a pain pill at a time.  You can also use Benadryl over the counter for itching or also to help with sleep.   TED HOSE STOCKINGS:  Use stockings on both legs until for at least 2 weeks or as directed by physician office. They may be removed at night for sleeping.  MEDICATIONS:  See your medication summary on the "After Visit Summary" that nursing will review with you.  You may have some home medications which will be placed on hold until you complete the course of blood thinner medication.  It is important for you to complete the blood thinner medication as prescribed.  PRECAUTIONS:  If you experience chest pain or shortness of breath - call 911 immediately for transfer to the hospital emergency department.   If you develop a fever greater that 101 F, purulent drainage from wound, increased redness or drainage from wound, foul odor from the wound/dressing, or calf pain - CONTACT YOUR SURGEON.                                                   FOLLOW-UP APPOINTMENTS:  If you do not already have a post-op appointment, please call the office for an appointment to be seen by your surgeon.  Guidelines for how soon to be seen are listed in your "After Visit Summary", but are typically between 1-4  weeks after surgery.  OTHER INSTRUCTIONS:   Knee Replacement:  Do not place pillow under knee, focus on keeping the knee straight while resting. CPM instructions: 0-90 degrees, 2 hours in the morning, 2 hours in the afternoon, and 2 hours in the evening. Place foam block, curve side up under heel at all times except when in CPM or when walking.  DO NOT modify, tear, cut, or change the foam block in any way.   MAKE SURE YOU:  Understand these instructions.  Get help right away if you are not doing well or get worse.    Thank you for letting us be a part of your medical care team.  It is a privilege we respect greatly.  We hope these instructions will help you stay on track for a fast and full recovery!   Diagnostic Studies: DG Knee Right Port  Result Date: 08/19/2022 CLINICAL DATA:  Infected right knee arthroplasty, placement of antibiotic spacers EXAM: PORTABLE RIGHT KNEE - 1-2 VIEW COMPARISON:  05/26/2017 FINDINGS:  Frontal and cross-table lateral views of the right knee are obtained. Interval removal of the right knee arthroplasty, with interval placement of antibiotic spacers. There is diffuse soft tissue edema, with subcutaneous gas anteriorly consistent with recent surgical intervention. Surgical drain in the region of the suprapatellar fossa. IMPRESSION: 1. Interval removal of right knee arthroplasty with placement of antibiotic spacers. Electronically Signed   By: Randa Ngo M.D.   On: 08/19/2022 15:54   [place] EKG SITE RITE  Result Date: 08/19/2022 If Site Rite image not attached, placement could not be confirmed due to current cardiac rhythm.   Disposition: Discharge disposition: 01-Home or Self Care       Discharge Instructions     [facility] pharmacist to adjust dose for Vancomycin, Aminoglycosides and other anti-infective therapies as requested by physician.   Complete by: As directed    [facility] to provide Cath Flo 2mg    Complete by: As  directed    Administer for PICC line occlusion and as ordered by physician for other access device issues.   Anaphylaxis Kit: Provided to treat any anaphylactic reaction to the medication being provided to the patient if First Dose or when requested by physician   Complete by: As directed    Epinephrine 1mg /ml vial / amp: Administer 0.3mg  (0.87ml) subcutaneously once for moderate to severe anaphylaxis, nurse to call physician and pharmacy when reaction occurs and call 911 if needed for immediate care   Diphenhydramine 50mg /ml IV vial: Administer 25-50mg  IV/IM PRN for first dose reaction, rash, itching, mild reaction, nurse to call physician and pharmacy when reaction occurs   Sodium Chloride 0.9% NS 551ml IV: Administer if needed for hypovolemic blood pressure drop or as ordered by physician after call to physician with anaphylactic reaction   Call MD / Call 911   Complete by: As directed    If you experience chest pain or shortness of breath, CALL 911 and be transported to the hospital emergency room.  If you develope a fever above 101 F, pus (white drainage) or increased drainage or redness at the wound, or calf pain, call your surgeon's office.   Change dressing on IV access line weekly and PRN   Complete by: As directed    Constipation Prevention   Complete by: As directed    Drink plenty of fluids.  Prune juice may be helpful.  You may use a stool softener, such as Colace (over the counter) 100 mg twice a day.  Use MiraLax (over the counter) for constipation as needed.   Diet - low sodium heart healthy   Complete by: As directed    Flush IV access with Sodium Chloride 0.9% and Heparin 10 units/ml or 100 units/ml   Complete by: As directed    Home infusion instructions - [facility]   Complete by: As directed    Instructions: Flush IV access with Sodium Chloride 0.9% and Heparin 10units/ml or 100units/ml   Change dressing on IV access line: Weekly and PRN   Instructions Cath Flo  2mg : Administer for PICC Line occlusion and as ordered by physician for other access device   [facility] pharmacist to adjust dose for: Vancomycin, Aminoglycosides and other anti-infective therapies as requested by physician   Increase activity slowly as tolerated   Complete by: As directed    Method of administration may be changed at the discretion of home infusion pharmacist based upon assessment of the patient and/or caregiver's ability to self-administer the medication ordered  Complete by: As directed    Post-operative opioid taper instructions:   Complete by: As directed    POST-OPERATIVE OPIOID TAPER INSTRUCTIONS: It is important to wean off of your opioid medication as soon as possible. If you do not need pain medication after your surgery it is ok to stop day one. Opioids include: Codeine, Hydrocodone(Norco, Vicodin), Oxycodone(Percocet, oxycontin) and hydromorphone amongst others.  Long term and even short term use of opiods can cause: Increased pain response Dependence Constipation Depression Respiratory depression And more.  Withdrawal symptoms can include Flu like symptoms Nausea, vomiting And more Techniques to manage these symptoms Hydrate well Eat regular healthy meals Stay active Use relaxation techniques(deep breathing, meditating, yoga) Do Not substitute Alcohol to help with tapering If you have been on opioids for less than two weeks and do not have pain than it is ok to stop all together.  Plan to wean off of opioids This plan should start within one week post op of your joint replacement. Maintain the same interval or time between taking each dose and first decrease the dose.  Cut the total daily intake of opioids by one tablet each day Next start to increase the time between doses. The last dose that should be eliminated is the evening dose.           Follow-up Information     [facility] Follow up.   Why: [facility] will provide IV  antibiotics in the home.        [facility], [facility] Follow up.   Specialty: Home Health Services Why: [facility] will provide nursing in the home after discharge for PICC line maintenance and labs. Contact information: [place] [address] [phone]                  Signed: Charlott Rakes, PA-C 08/27/2022, 11:54 AM

## 2022-08-30 DIAGNOSIS — I10 Essential (primary) hypertension: Secondary | ICD-10-CM | POA: Diagnosis not present

## 2022-08-30 DIAGNOSIS — Z9181 History of falling: Secondary | ICD-10-CM | POA: Diagnosis not present

## 2022-08-30 DIAGNOSIS — Z7982 Long term (current) use of aspirin: Secondary | ICD-10-CM | POA: Diagnosis not present

## 2022-08-30 DIAGNOSIS — Z86718 Personal history of other venous thrombosis and embolism: Secondary | ICD-10-CM | POA: Diagnosis not present

## 2022-08-30 DIAGNOSIS — I739 Peripheral vascular disease, unspecified: Secondary | ICD-10-CM | POA: Diagnosis not present

## 2022-08-30 DIAGNOSIS — Z452 Encounter for adjustment and management of vascular access device: Secondary | ICD-10-CM | POA: Diagnosis not present

## 2022-08-30 DIAGNOSIS — T8453XA Infection and inflammatory reaction due to internal right knee prosthesis, initial encounter: Secondary | ICD-10-CM | POA: Diagnosis not present

## 2022-08-30 DIAGNOSIS — K219 Gastro-esophageal reflux disease without esophagitis: Secondary | ICD-10-CM | POA: Diagnosis not present

## 2022-08-30 DIAGNOSIS — B950 Streptococcus, group A, as the cause of diseases classified elsewhere: Secondary | ICD-10-CM | POA: Diagnosis not present

## 2022-08-30 DIAGNOSIS — Z792 Long term (current) use of antibiotics: Secondary | ICD-10-CM | POA: Diagnosis not present

## 2022-08-30 DIAGNOSIS — Z5181 Encounter for therapeutic drug level monitoring: Secondary | ICD-10-CM | POA: Diagnosis not present

## 2022-09-02 DIAGNOSIS — M009 Pyogenic arthritis, unspecified: Secondary | ICD-10-CM | POA: Diagnosis not present

## 2022-09-02 DIAGNOSIS — T8453XA Infection and inflammatory reaction due to internal right knee prosthesis, initial encounter: Secondary | ICD-10-CM | POA: Diagnosis not present

## 2022-09-06 ENCOUNTER — Inpatient Hospital Stay: Payer: Medicare Other | Admitting: Internal Medicine

## 2022-09-06 DIAGNOSIS — Z792 Long term (current) use of antibiotics: Secondary | ICD-10-CM | POA: Diagnosis not present

## 2022-09-06 DIAGNOSIS — T8453XA Infection and inflammatory reaction due to internal right knee prosthesis, initial encounter: Secondary | ICD-10-CM | POA: Diagnosis not present

## 2022-09-06 DIAGNOSIS — K219 Gastro-esophageal reflux disease without esophagitis: Secondary | ICD-10-CM | POA: Diagnosis not present

## 2022-09-06 DIAGNOSIS — Z5181 Encounter for therapeutic drug level monitoring: Secondary | ICD-10-CM | POA: Diagnosis not present

## 2022-09-06 DIAGNOSIS — Z7982 Long term (current) use of aspirin: Secondary | ICD-10-CM | POA: Diagnosis not present

## 2022-09-06 DIAGNOSIS — Z9181 History of falling: Secondary | ICD-10-CM | POA: Diagnosis not present

## 2022-09-06 DIAGNOSIS — T8453XD Infection and inflammatory reaction due to internal right knee prosthesis, subsequent encounter: Secondary | ICD-10-CM | POA: Diagnosis not present

## 2022-09-06 DIAGNOSIS — I10 Essential (primary) hypertension: Secondary | ICD-10-CM | POA: Diagnosis not present

## 2022-09-06 DIAGNOSIS — Z86718 Personal history of other venous thrombosis and embolism: Secondary | ICD-10-CM | POA: Diagnosis not present

## 2022-09-06 DIAGNOSIS — Z452 Encounter for adjustment and management of vascular access device: Secondary | ICD-10-CM | POA: Diagnosis not present

## 2022-09-06 DIAGNOSIS — B950 Streptococcus, group A, as the cause of diseases classified elsewhere: Secondary | ICD-10-CM | POA: Diagnosis not present

## 2022-09-06 DIAGNOSIS — I739 Peripheral vascular disease, unspecified: Secondary | ICD-10-CM | POA: Diagnosis not present

## 2022-09-06 NOTE — Progress Notes (Deleted)
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 presents for hospital follow-up ROS  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[place])    right   Flexion contracture of knee, right    post op total knee 05-26-2017   GERD (gastroesophageal reflux disease)    History of colon polyps    History of DVT of lower extremity 06/13/2017   ED visit --  dvt right lower leg post op right total knee   Hypertension    Palpitations    per cardiologist note, dr Dorris Carnes, 2015--  24hr monitor show SB to ST w/  rare PVCs and rare PACs   Peripheral vascular disease ([place])     Outpatient Medications Prior to Visit  Medication Sig Dispense Refill   acetaminophen (TYLENOL) 325 MG tablet Take 1-2 tablets (325-650 mg total) by mouth every 6 (six) hours as needed for mild pain (pain score 1-3 or temp > 100.5).     aspirin EC 81 MG tablet Take 1 tablet (81 mg total) by mouth 2 (two) times daily. Swallow whole. 90 tablet    cefTRIAXone (ROCEPHIN) IVPB Inject 2 g into the vein daily. Indication:  R knee PJI First Dose: No Last Day of Therapy:  09/30/22 Labs - Once weekly:  CBC/D and BMP, Labs - Every other week:  ESR and CRP Method of administration: IV Push Method of administration may be changed at the discretion of home infusion pharmacist based upon assessment of the patient and/or caregiver's ability to self-administer the medication ordered. 39 Units 0   lisinopril (PRINIVIL,ZESTRIL) 20 MG tablet Take 20 mg by mouth every morning.      Multiple Vitamin (MULTIVITAMIN ADULT PO)  Take 1 tablet by mouth daily.     oxyCODONE (OXY IR/ROXICODONE) 5 MG immediate release tablet Take 1 tablet (5 mg total) by mouth every 4 (four) hours as needed for moderate pain or severe pain. 20 tablet 0   No facility-administered medications prior to visit.     Not on File  Social History   Tobacco Use   Smoking status: Never   Smokeless tobacco: Never  Vaping Use   Vaping Use: Never used  Substance Use Topics   Alcohol use: Yes    Alcohol/week: 1.0 - 2.0 standard drink of alcohol    Types: 1 - 2 Glasses of wine per week    Comment: occasionally    Drug use: No    Family History  Problem Relation Age of Onset   Hypertension Mother    Heart disease Brother    Heart disease Brother     Objective:  There were no vitals filed for this visit. There is no height or weight on file to calculate BMI.  Physical Exam  Lab Results: Lab Results  Component Value Date   WBC 8.3 08/21/2022   HGB 9.7 (L) 08/21/2022   HCT 31.0 (L) 08/21/2022   MCV 87.1 08/21/2022   PLT 202 08/21/2022    Lab Results  Component Value Date   CREATININE 0.69 08/20/2022  BUN 10 08/20/2022   NA 136 08/20/2022   K 4.2 08/20/2022   CL 102 08/20/2022   CO2 26 08/20/2022   No results found for: "ALT", "AST", "GGT", "ALKPHOS", "BILITOT"   Assessment & Plan:  #R knee PJI   #Medication monitoring   Laurice Record, MD [facility]   09/06/22  5:27 AM

## 2022-09-09 ENCOUNTER — Other Ambulatory Visit: Payer: Self-pay

## 2022-09-09 ENCOUNTER — Ambulatory Visit: Payer: Medicare Other | Admitting: Internal Medicine

## 2022-09-09 VITALS — BP 121/79 | HR 98 | Temp 98.5°F | Wt 194.0 lb

## 2022-09-09 DIAGNOSIS — T8453XD Infection and inflammatory reaction due to internal right knee prosthesis, subsequent encounter: Secondary | ICD-10-CM

## 2022-09-09 DIAGNOSIS — T8450XS Infection and inflammatory reaction due to unspecified internal joint prosthesis, sequela: Secondary | ICD-10-CM

## 2022-09-09 DIAGNOSIS — M009 Pyogenic arthritis, unspecified: Secondary | ICD-10-CM | POA: Diagnosis not present

## 2022-09-09 DIAGNOSIS — T8453XA Infection and inflammatory reaction due to internal right knee prosthesis, initial encounter: Secondary | ICD-10-CM | POA: Diagnosis not present

## 2022-09-09 NOTE — Progress Notes (Signed)
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 M with past medical history of DVT, hypertension, PVD, arthritis status post right TKA on 05/26/2017 with Dr. by Dr. Tonita Cong progressed well until this summer.  His right knee was aspirated on 05/11/2022 with cultures growing strep mitis/oralis placed on Keflex.  Seen by Dr. Sid Falcon in office and plan for two-stage procedure.  Taken to the OR on 1/19 for right knee arthroplasty with removal of hardware and placement of antibiotic spacer.  Or cultures grew strep mitis/oralis discharged on 6 weeks of ceftriaxone EOT 2/29 Today 09/09/22: Pt presents with his son. He is adherent to antibiotics.  Review of Systems  All other systems reviewed and are negative.   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[place])    right   Flexion contracture of knee, right    post op total knee 05-26-2017   GERD (gastroesophageal reflux disease)    History of colon polyps    History of DVT of lower extremity 06/13/2017   ED visit --  dvt right lower leg post op right total knee   Hypertension    Palpitations    per cardiologist note, dr Dorris Carnes, 2015--  24hr monitor show SB to ST w/  rare PVCs and rare PACs   Peripheral vascular disease ([place])     Outpatient Medications Prior to Visit  Medication Sig Dispense Refill   acetaminophen (TYLENOL) 325 MG tablet Take 1-2 tablets (325-650 mg total) by mouth every 6 (six) hours as needed for mild pain (pain score 1-3 or temp > 100.5).     aspirin EC 81 MG tablet Take 1 tablet (81 mg total) by  mouth 2 (two) times daily. Swallow whole. 90 tablet    cefTRIAXone (ROCEPHIN) IVPB Inject 2 g into the vein daily. Indication:  R knee PJI First Dose: No Last Day of Therapy:  09/30/22 Labs - Once weekly:  CBC/D and BMP, Labs - Every other week:  ESR and CRP Method of administration: IV Push Method of administration may be changed at the discretion of home infusion pharmacist based upon assessment of the patient and/or caregiver's ability to self-administer the medication ordered. 39 Units 0   lisinopril (PRINIVIL,ZESTRIL) 20 MG tablet Take 20 mg by mouth every morning.      Multiple Vitamin (MULTIVITAMIN ADULT PO) Take 1 tablet by mouth daily.     oxyCODONE (OXY IR/ROXICODONE) 5 MG immediate release tablet Take 1 tablet (5 mg total) by mouth every 4 (four) hours as needed for moderate pain or severe pain. 20 tablet 0   No facility-administered medications prior to visit.     No Known Allergies  Social History   Tobacco Use   Smoking status: Never   Smokeless tobacco: Never  Vaping Use   Vaping Use: Never used  Substance Use Topics   Alcohol use: Yes    Alcohol/week: 1.0 - 2.0 standard drink of alcohol    Types: 1 - 2 Glasses of wine per week    Comment: occasionally    Drug  use: No    Family History  Problem Relation Age of Onset   Hypertension Mother    Heart disease Brother    Heart disease Brother     Objective:   Vitals:   09/09/22 1035  BP: 121/79  Pulse: 98  Temp: 98.5 F (36.9 C)  TempSrc: Temporal  SpO2: 100%  Weight: 194 lb (88 kg)   Body mass index is 27.06 kg/m.  Physical Exam Constitutional:      General: He is not in acute distress.    Appearance: He is normal weight. He is not toxic-appearing.  HENT:     Head: Normocephalic and atraumatic.     Right Ear: External ear normal.     Left Ear: External ear normal.     Nose: No congestion or rhinorrhea.     Mouth/Throat:     Mouth: Mucous membranes are moist.     Pharynx: Oropharynx is clear.   Eyes:     Extraocular Movements: Extraocular movements intact.     Conjunctiva/sclera: Conjunctivae normal.     Pupils: Pupils are equal, round, and reactive to light.  Cardiovascular:     Rate and Rhythm: Normal rate and regular rhythm.     Heart sounds: No murmur heard.    No friction rub. No gallop.  Pulmonary:     Effort: Pulmonary effort is normal.     Breath sounds: Normal breath sounds.  Abdominal:     General: Abdomen is flat. Bowel sounds are normal.     Palpations: Abdomen is soft.  Musculoskeletal:        General: No swelling.     Cervical back: Normal range of motion and neck supple.  Skin:    General: Skin is warm and dry.  Neurological:     General: No focal deficit present.     Mental Status: He is oriented to person, place, and time.  Psychiatric:        Mood and Affect: Mood normal.     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 Tonita Cong did well until he developed knee pain and swelling over the summer. - Knee aspirated on 05/13/2022 at Dr. Sid Falcon office grew strep mitis/oralis patient placed on Keflex.  Prior to admission he was on Keflex twice daily dosing - Taken to the OR for resection of right knee arthroplasty and antibiotic spacer placement.  Plan on two-stage procedure.  Or cultures with strep mitis/oralis - Patient discharged on ceftriaxone to complete 6 weeks of antibiotics from the OR EOT 09/30/2022. The wound appears to be  healing well. We discussed that plan on 6 weeks of antibiotics. He is followed by Dr. Lyla Glassing outpatient(last seen yesterday), next visit on 3/4. - Follow-up with ID on 09/29/22. Will try to obtain surgery notes.  #Medication monitoring 09/06/22: wbc 7.1, Scr 0.62, esr 40, crp 23(trending down from 86, 161 on  08/24/22)  Laurice Record, [place] for [facility]   09/09/22  12:23 PM   I have personally spent 40 minutes involved in face-to-face and non-face-to-face activities for this patient on the day of the visit. Professional time spent includes the following activities: Preparing to see the patient (review of tests), Obtaining and/or reviewing separately obtained history (admission/discharge record), Performing a medically appropriate examination and/or evaluation , Ordering medications/tests/procedures, referring and communicating with other health care professionals, Documenting clinical information in the EMR, Independently interpreting results (not separately reported), Communicating results to the patient/family/caregiver, Counseling and educating the patient/family/caregiver and Care coordination (not separately reported).

## 2022-09-13 DIAGNOSIS — B950 Streptococcus, group A, as the cause of diseases classified elsewhere: Secondary | ICD-10-CM | POA: Diagnosis not present

## 2022-09-13 DIAGNOSIS — Z86718 Personal history of other venous thrombosis and embolism: Secondary | ICD-10-CM | POA: Diagnosis not present

## 2022-09-13 DIAGNOSIS — T8453XA Infection and inflammatory reaction due to internal right knee prosthesis, initial encounter: Secondary | ICD-10-CM | POA: Diagnosis not present

## 2022-09-13 DIAGNOSIS — Z5181 Encounter for therapeutic drug level monitoring: Secondary | ICD-10-CM | POA: Diagnosis not present

## 2022-09-13 DIAGNOSIS — I10 Essential (primary) hypertension: Secondary | ICD-10-CM | POA: Diagnosis not present

## 2022-09-13 DIAGNOSIS — K219 Gastro-esophageal reflux disease without esophagitis: Secondary | ICD-10-CM | POA: Diagnosis not present

## 2022-09-13 DIAGNOSIS — Z792 Long term (current) use of antibiotics: Secondary | ICD-10-CM | POA: Diagnosis not present

## 2022-09-13 DIAGNOSIS — Z7982 Long term (current) use of aspirin: Secondary | ICD-10-CM | POA: Diagnosis not present

## 2022-09-13 DIAGNOSIS — I739 Peripheral vascular disease, unspecified: Secondary | ICD-10-CM | POA: Diagnosis not present

## 2022-09-13 DIAGNOSIS — Z452 Encounter for adjustment and management of vascular access device: Secondary | ICD-10-CM | POA: Diagnosis not present

## 2022-09-13 DIAGNOSIS — Z9181 History of falling: Secondary | ICD-10-CM | POA: Diagnosis not present

## 2022-09-16 DIAGNOSIS — M009 Pyogenic arthritis, unspecified: Secondary | ICD-10-CM | POA: Diagnosis not present

## 2022-09-16 DIAGNOSIS — T8453XA Infection and inflammatory reaction due to internal right knee prosthesis, initial encounter: Secondary | ICD-10-CM | POA: Diagnosis not present

## 2022-09-20 DIAGNOSIS — Z5181 Encounter for therapeutic drug level monitoring: Secondary | ICD-10-CM | POA: Diagnosis not present

## 2022-09-20 DIAGNOSIS — I739 Peripheral vascular disease, unspecified: Secondary | ICD-10-CM | POA: Diagnosis not present

## 2022-09-20 DIAGNOSIS — Z9181 History of falling: Secondary | ICD-10-CM | POA: Diagnosis not present

## 2022-09-20 DIAGNOSIS — Z7982 Long term (current) use of aspirin: Secondary | ICD-10-CM | POA: Diagnosis not present

## 2022-09-20 DIAGNOSIS — Z452 Encounter for adjustment and management of vascular access device: Secondary | ICD-10-CM | POA: Diagnosis not present

## 2022-09-20 DIAGNOSIS — Z792 Long term (current) use of antibiotics: Secondary | ICD-10-CM | POA: Diagnosis not present

## 2022-09-20 DIAGNOSIS — B950 Streptococcus, group A, as the cause of diseases classified elsewhere: Secondary | ICD-10-CM | POA: Diagnosis not present

## 2022-09-20 DIAGNOSIS — T8453XA Infection and inflammatory reaction due to internal right knee prosthesis, initial encounter: Secondary | ICD-10-CM | POA: Diagnosis not present

## 2022-09-20 DIAGNOSIS — I10 Essential (primary) hypertension: Secondary | ICD-10-CM | POA: Diagnosis not present

## 2022-09-20 DIAGNOSIS — K219 Gastro-esophageal reflux disease without esophagitis: Secondary | ICD-10-CM | POA: Diagnosis not present

## 2022-09-20 DIAGNOSIS — Z86718 Personal history of other venous thrombosis and embolism: Secondary | ICD-10-CM | POA: Diagnosis not present

## 2022-09-23 DIAGNOSIS — T8453XA Infection and inflammatory reaction due to internal right knee prosthesis, initial encounter: Secondary | ICD-10-CM | POA: Diagnosis not present

## 2022-09-23 DIAGNOSIS — M009 Pyogenic arthritis, unspecified: Secondary | ICD-10-CM | POA: Diagnosis not present

## 2022-09-27 ENCOUNTER — Telehealth: Payer: Self-pay

## 2022-09-27 DIAGNOSIS — Z86718 Personal history of other venous thrombosis and embolism: Secondary | ICD-10-CM | POA: Diagnosis not present

## 2022-09-27 DIAGNOSIS — Z452 Encounter for adjustment and management of vascular access device: Secondary | ICD-10-CM | POA: Diagnosis not present

## 2022-09-27 DIAGNOSIS — I10 Essential (primary) hypertension: Secondary | ICD-10-CM | POA: Diagnosis not present

## 2022-09-27 DIAGNOSIS — T8453XA Infection and inflammatory reaction due to internal right knee prosthesis, initial encounter: Secondary | ICD-10-CM | POA: Diagnosis not present

## 2022-09-27 DIAGNOSIS — Z7982 Long term (current) use of aspirin: Secondary | ICD-10-CM | POA: Diagnosis not present

## 2022-09-27 DIAGNOSIS — B950 Streptococcus, group A, as the cause of diseases classified elsewhere: Secondary | ICD-10-CM | POA: Diagnosis not present

## 2022-09-27 DIAGNOSIS — Z9181 History of falling: Secondary | ICD-10-CM | POA: Diagnosis not present

## 2022-09-27 DIAGNOSIS — Z792 Long term (current) use of antibiotics: Secondary | ICD-10-CM | POA: Diagnosis not present

## 2022-09-27 DIAGNOSIS — K219 Gastro-esophageal reflux disease without esophagitis: Secondary | ICD-10-CM | POA: Diagnosis not present

## 2022-09-27 DIAGNOSIS — Z5181 Encounter for therapeutic drug level monitoring: Secondary | ICD-10-CM | POA: Diagnosis not present

## 2022-09-27 DIAGNOSIS — I739 Peripheral vascular disease, unspecified: Secondary | ICD-10-CM | POA: Diagnosis not present

## 2022-09-27 NOTE — Telephone Encounter (Signed)
Received message from [employer] wanting to know if okay to pull PICC after last dose. Patient has appointment scheduled for 2/28, EOT is 2/29.  Beryle Flock, RN

## 2022-09-29 ENCOUNTER — Ambulatory Visit: Payer: Medicare Other | Admitting: Internal Medicine

## 2022-09-29 ENCOUNTER — Encounter: Payer: Self-pay | Admitting: Internal Medicine

## 2022-09-29 ENCOUNTER — Other Ambulatory Visit: Payer: Self-pay

## 2022-09-29 VITALS — BP 119/80 | HR 79 | Temp 98.0°F | Wt 194.0 lb

## 2022-09-29 DIAGNOSIS — T8450XS Infection and inflammatory reaction due to unspecified internal joint prosthesis, sequela: Secondary | ICD-10-CM

## 2022-09-29 DIAGNOSIS — T8453XD Infection and inflammatory reaction due to internal right knee prosthesis, subsequent encounter: Secondary | ICD-10-CM | POA: Diagnosis not present

## 2022-09-29 NOTE — Progress Notes (Signed)
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   ASPIRIN EC 81 MG TABLET    Take 1 tablet (81 mg total) by mouth 2 (two) times daily. Swallow whole.   CEFTRIAXONE (ROCEPHIN) IVPB    Inject 2 g into the vein daily. Indication:  R knee PJI First Dose: No Last Day of Therapy:  09/30/22 Labs - Once weekly:  CBC/D and BMP, Labs - Every other week:  ESR and CRP Method of administration: IV Push Method of administration may be changed at the discretion of home infusion pharmacist based upon assessment of the patient and/or caregiver's ability to self-administer the medication ordered.   LISINOPRIL (PRINIVIL,ZESTRIL) 20 MG TABLET    Take 20 mg by mouth every morning.    MULTIPLE VITAMIN (MULTIVITAMIN ADULT PO)    Take 1 tablet by mouth daily.   OXYCODONE (OXY IR/ROXICODONE) 5 MG IMMEDIATE RELEASE TABLET    Take 1 tablet (5 mg total) by mouth every 4 (four) hours as needed for moderate pain or severe pain.  Modified Medications   No medications on file  Discontinued Medications   No medications on file    Subjective: Bryan Carroll is a 71 y.o. M with past medical history of DVT, hypertension, PVD, arthritis status post right TKA on 05/26/2017 with Dr. by Dr. Tonita Cong progressed well until this summer.  His right knee was aspirated on 05/11/2022 with cultures growing strep mitis/oralis placed on Keflex.  Seen by Dr. Sid Falcon in office and plan for two-stage procedure.  Taken to the OR on 1/19 for right  knee arthroplasty with removal of hardware and placement of antibiotic spacer.  Or cultures grew strep mitis/oralis discharged on 6 weeks of ceftriaxone EOT 2/29 09/09/22: Pt presents with his son. He is adherent to antibiotics.   2/28: Doing well. No new complaints.  Review of Systems: Review of Systems  All other systems reviewed and are negative.   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[place])    right   Flexion contracture of knee, right    post op total knee 05-26-2017   GERD (gastroesophageal reflux disease)    History of colon polyps    History of DVT of lower extremity 06/13/2017   ED visit --  dvt right lower leg post op right total knee   Hypertension    Palpitations    per cardiologist note, dr Dorris Carnes, 2015--  24hr monitor show SB to ST w/  rare PVCs and rare PACs   Peripheral vascular disease ([place])     Social History   Tobacco Use   Smoking status: Never   Smokeless tobacco: Never  Vaping Use   Vaping Use: Never used  Substance Use Topics   Alcohol use: Yes    Alcohol/week: 1.0 - 2.0 standard drink of alcohol    Types: 1 - 2 Glasses of wine per week  Comment: occasionally    Drug use: No    Family History  Problem Relation Age of Onset   Hypertension Mother    Heart disease Brother    Heart disease Brother     No Known Allergies  Health Maintenance  Topic Date Due   Medicare Annual Wellness (AWV)  Never done   Hepatitis C Screening  Never done   DTaP/Tdap/Td (1 - Tdap) Never done   COLONOSCOPY (Pts 45-45yr Insurance coverage will need to be confirmed)  Never done   Zoster Vaccines- Shingrix (1 of 2) Never done   Pneumonia Vaccine 70 Years old (1 of 1 - PCV) Never done   INFLUENZA VACCINE  Never done   COVID-19 Vaccine (3 - 2023-24 season) 04/02/2022   HPV VACCINES  Aged Out    Objective:  Vitals:   09/29/22 1412  BP: 119/80  Pulse: 79  Temp: 98 F (36.7 C)  TempSrc: Temporal   SpO2: 97%  Weight: 194 lb (88 kg)   Body mass index is 27.06 kg/m.  Physical Exam Constitutional:      General: He is not in acute distress.    Appearance: He is normal weight. He is not toxic-appearing.  HENT:     Head: Normocephalic and atraumatic.     Right Ear: External ear normal.     Left Ear: External ear normal.     Nose: No congestion or rhinorrhea.     Mouth/Throat:     Mouth: Mucous membranes are moist.     Pharynx: Oropharynx is clear.  Eyes:     Extraocular Movements: Extraocular movements intact.     Conjunctiva/sclera: Conjunctivae normal.     Pupils: Pupils are equal, round, and reactive to light.  Cardiovascular:     Rate and Rhythm: Normal rate and regular rhythm.     Heart sounds: No murmur heard.    No friction rub. No gallop.  Pulmonary:     Effort: Pulmonary effort is normal.     Breath sounds: Normal breath sounds.  Abdominal:     General: Abdomen is flat. Bowel sounds are normal.     Palpations: Abdomen is soft.  Musculoskeletal:        General: No swelling. Normal range of motion.     Cervical back: Normal range of motion and neck supple.  Skin:    General: Skin is warm and dry.  Neurological:     General: No focal deficit present.     Mental Status: He is oriented to person, place, and time.  Psychiatric:        Mood and Affect: Mood normal.     Lab Results Lab Results  Component Value Date   WBC 8.3 08/21/2022   HGB 9.7 (L) 08/21/2022   HCT 31.0 (L) 08/21/2022   MCV 87.1 08/21/2022   PLT 202 08/21/2022    Lab Results  Component Value Date   CREATININE 0.69 08/20/2022   BUN 10 08/20/2022   NA 136 08/20/2022   K 4.2 08/20/2022   CL 102 08/20/2022   CO2 26 08/20/2022   No results found for: "ALT", "AST", "GGT", "ALKPHOS", "BILITOT"  No results found for: "CHOL", "HDL", "LDLCALC", "LDLDIRECT", "TRIG", "CHOLHDL" No results found for: "LABRPR", "RPRTITER" No results found for: "HIV1RNAQUANT", "HIV1RNAVL", "CD4TABS"   Problem  List Items Addressed This Visit   None  Assessment/Plan #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  Beane did well until he developed knee pain and swelling over the summer. - Knee aspirated on 05/13/2022 at Dr. Sid Falcon office grew strep mitis/oralis patient placed on Keflex.  Prior to admission he was on Keflex twice daily dosing - Taken to the OR for resection of right knee arthroplasty and antibiotic spacer placement.  Plan on two-stage procedure.  Or cultures with strep mitis/oralis Plan: -  Ceftriaxone to complete 6 weeks of antibiotics from the OR EOT 09/30/2022. Pull PICC after last dose of antibiotics. The wound appears to be healing well. Marland Kitchen He is followed by Dr. Lyla Glassing outpatient(last seen yesterday), next visit on 3/4.  Pt's son was able to show me Dr. Carver Fila last note, noted knee was doing  with f.u planned in  4 weeks.  - Follow-up with ID in 3 weeks to f/u on surgical plans and to monitor off of antibiotics.    #Medication monitoring 09/06/22: wbc 7.1, Scr 0.62, esr 40, crp 23 Last set of labs: ESR, CRP 38, 12 respectively, wbc 5.1, scr 0.60   Laurice Record, MD [place] for Infectious Disease [facility] 09/29/2022, 2:16 PM

## 2022-09-30 ENCOUNTER — Telehealth: Payer: Self-pay

## 2022-09-30 NOTE — Telephone Encounter (Signed)
Per Dr. Candiss Norse pull picc after last dose on 2/29. Sent  a community message to Carolynn Sayers, RN at [facility].

## 2022-10-01 DIAGNOSIS — Z9181 History of falling: Secondary | ICD-10-CM | POA: Diagnosis not present

## 2022-10-01 DIAGNOSIS — T8453XA Infection and inflammatory reaction due to internal right knee prosthesis, initial encounter: Secondary | ICD-10-CM | POA: Diagnosis not present

## 2022-10-01 DIAGNOSIS — I10 Essential (primary) hypertension: Secondary | ICD-10-CM | POA: Diagnosis not present

## 2022-10-01 DIAGNOSIS — Z7982 Long term (current) use of aspirin: Secondary | ICD-10-CM | POA: Diagnosis not present

## 2022-10-01 DIAGNOSIS — B950 Streptococcus, group A, as the cause of diseases classified elsewhere: Secondary | ICD-10-CM | POA: Diagnosis not present

## 2022-10-01 DIAGNOSIS — Z452 Encounter for adjustment and management of vascular access device: Secondary | ICD-10-CM | POA: Diagnosis not present

## 2022-10-01 DIAGNOSIS — K219 Gastro-esophageal reflux disease without esophagitis: Secondary | ICD-10-CM | POA: Diagnosis not present

## 2022-10-01 DIAGNOSIS — Z792 Long term (current) use of antibiotics: Secondary | ICD-10-CM | POA: Diagnosis not present

## 2022-10-01 DIAGNOSIS — Z86718 Personal history of other venous thrombosis and embolism: Secondary | ICD-10-CM | POA: Diagnosis not present

## 2022-10-01 DIAGNOSIS — I739 Peripheral vascular disease, unspecified: Secondary | ICD-10-CM | POA: Diagnosis not present

## 2022-10-04 DIAGNOSIS — T8453XD Infection and inflammatory reaction due to internal right knee prosthesis, subsequent encounter: Secondary | ICD-10-CM | POA: Diagnosis not present

## 2022-10-05 DIAGNOSIS — Z792 Long term (current) use of antibiotics: Secondary | ICD-10-CM | POA: Diagnosis not present

## 2022-10-05 DIAGNOSIS — Z86718 Personal history of other venous thrombosis and embolism: Secondary | ICD-10-CM | POA: Diagnosis not present

## 2022-10-05 DIAGNOSIS — I10 Essential (primary) hypertension: Secondary | ICD-10-CM | POA: Diagnosis not present

## 2022-10-05 DIAGNOSIS — K219 Gastro-esophageal reflux disease without esophagitis: Secondary | ICD-10-CM | POA: Diagnosis not present

## 2022-10-05 DIAGNOSIS — I739 Peripheral vascular disease, unspecified: Secondary | ICD-10-CM | POA: Diagnosis not present

## 2022-10-05 DIAGNOSIS — B950 Streptococcus, group A, as the cause of diseases classified elsewhere: Secondary | ICD-10-CM | POA: Diagnosis not present

## 2022-10-05 DIAGNOSIS — Z452 Encounter for adjustment and management of vascular access device: Secondary | ICD-10-CM | POA: Diagnosis not present

## 2022-10-05 DIAGNOSIS — Z7982 Long term (current) use of aspirin: Secondary | ICD-10-CM | POA: Diagnosis not present

## 2022-10-05 DIAGNOSIS — Z9181 History of falling: Secondary | ICD-10-CM | POA: Diagnosis not present

## 2022-10-05 DIAGNOSIS — T8453XA Infection and inflammatory reaction due to internal right knee prosthesis, initial encounter: Secondary | ICD-10-CM | POA: Diagnosis not present

## 2022-10-18 DIAGNOSIS — T8453XD Infection and inflammatory reaction due to internal right knee prosthesis, subsequent encounter: Secondary | ICD-10-CM | POA: Diagnosis not present

## 2022-10-19 DIAGNOSIS — Z01818 Encounter for other preprocedural examination: Secondary | ICD-10-CM | POA: Diagnosis not present

## 2022-10-19 DIAGNOSIS — T8453XS Infection and inflammatory reaction due to internal right knee prosthesis, sequela: Secondary | ICD-10-CM | POA: Diagnosis not present

## 2022-10-20 ENCOUNTER — Encounter: Payer: Self-pay | Admitting: Internal Medicine

## 2022-10-20 ENCOUNTER — Ambulatory Visit: Payer: Medicare Other | Admitting: Internal Medicine

## 2022-10-20 ENCOUNTER — Other Ambulatory Visit: Payer: Self-pay

## 2022-10-20 VITALS — BP 135/78 | HR 68 | Temp 97.3°F | Ht 70.0 in | Wt 194.0 lb

## 2022-10-20 DIAGNOSIS — T8453XD Infection and inflammatory reaction due to internal right knee prosthesis, subsequent encounter: Secondary | ICD-10-CM

## 2022-10-20 DIAGNOSIS — T8450XS Infection and inflammatory reaction due to unspecified internal joint prosthesis, sequela: Secondary | ICD-10-CM

## 2022-10-20 NOTE — Progress Notes (Signed)
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 with past medical history of DVT, hypertension, PVD, arthritis status post right TKA on 05/26/2017 with Dr. by Dr. Tonita Cong progressed well until this summer.  His right knee was aspirated on 05/11/2022 with cultures growing strep mitis/oralis placed on Keflex.  Seen by Dr. Sid Falcon in office and plan for two-stage procedure.  Taken to the OR on 1/19 for right knee arthroplasty with removal of hardware and placement of antibiotic spacer.  Or cultures grew strep mitis/oralis discharged on 6 weeks of ceftriaxone EOT 2/29 09/09/22: Pt presents with his son. He is adherent to antibiotics.   09/29/22: Doing well. No new complaints.  Today 10/20/22: Pt presents with his son. Reports increased ROM of knee.  Review of Systems  All other systems reviewed and are negative.   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[place])    right   Flexion contracture of knee, right    post op total knee 05-26-2017   GERD (gastroesophageal reflux disease)    History of colon polyps    History of DVT of lower extremity 06/13/2017   ED visit --  dvt right lower leg post op right total knee   Hypertension    Palpitations    per cardiologist note, dr Dorris Carnes, 2015--  24hr monitor show SB to ST w/  rare PVCs and rare PACs   Peripheral vascular disease ([place])     Outpatient Medications Prior to Visit  Medication Sig Dispense Refill   acetaminophen (TYLENOL) 325 MG tablet Take 1-2 tablets (325-650 mg total) by mouth every 6 (six) hours as needed  for mild pain (pain score 1-3 or temp > 100.5).     lisinopril (PRINIVIL,ZESTRIL) 20 MG tablet Take 20 mg by mouth every morning.      Multiple Vitamin (MULTIVITAMIN ADULT PO) Take 1 tablet by mouth daily.     oxyCODONE (OXY IR/ROXICODONE) 5 MG immediate release tablet Take 1 tablet (5 mg total) by mouth every 4 (four) hours as needed for moderate pain or severe pain. 20 tablet 0   No facility-administered medications prior to visit.     No Known Allergies  Social History   Tobacco Use   Smoking status: Never   Smokeless tobacco: Never  Vaping Use   Vaping Use: Never used  Substance Use Topics   Alcohol use: Yes    Alcohol/week: 1.0 - 2.0 standard drink of alcohol    Types: 1 - 2 Glasses of wine per week    Comment: occasionally    Drug use: No    Family History  Problem Relation Age of Onset   Hypertension Mother    Heart disease Brother    Heart disease Brother     Objective:  There were no vitals filed for this visit. There is no height or weight on file to calculate BMI.  Physical Exam Constitutional:      General: He is not in acute distress.    Appearance: He is normal weight. He is not  toxic-appearing.  HENT:     Head: Normocephalic and atraumatic.     Right Ear: External ear normal.     Left Ear: External ear normal.     Nose: No congestion or rhinorrhea.     Mouth/Throat:     Mouth: Mucous membranes are moist.     Pharynx: Oropharynx is clear.  Eyes:     Extraocular Movements: Extraocular movements intact.     Conjunctiva/sclera: Conjunctivae normal.     Pupils: Pupils are equal, round, and reactive to light.  Cardiovascular:     Rate and Rhythm: Normal rate and regular rhythm.     Heart sounds: No murmur heard.    No friction rub. No gallop.  Pulmonary:     Effort: Pulmonary effort is normal.     Breath sounds: Normal breath sounds.  Abdominal:     General: Abdomen is flat. Bowel sounds are normal.     Palpations: Abdomen is soft.   Musculoskeletal:        General: No swelling.     Cervical back: Normal range of motion and neck supple.     Comments: Right TKA incision healed  Skin:    General: Skin is warm and dry.  Neurological:     General: No focal deficit present.     Mental Status: He is oriented to person, place, and time.  Psychiatric:        Mood and Affect: Mood normal.     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 Tonita Cong did well until he developed knee pain and swelling over the summer. - Knee aspirated on 05/13/2022 at Dr. Sid Falcon office grew strep mitis/oralis patient placed on Keflex.  Prior to admission he was on Keflex twice daily dosing - Taken to the OR for resection of right knee arthroplasty and antibiotic spacer placement.  Plan on two-stage procedure.  Or cultures with strep mitis/oralis -  Ceftriaxone x 6 weeks of antibiotics from the OR EOT 09/30/2022. Pulled PICC. Seen at ortho visit and knee aspirated Cx pending. Plan: -Labs to day off of antibiotics stable: wbc 7.4, esr 2(on 3/19 ordered by ortho) , pt showed results on his  -Record release for ortho notes with Cx data -F/U 3 months  Laurice Record, MD [place] [facility]   10/20/22  10:53 AM

## 2022-12-09 NOTE — Progress Notes (Addendum)
Anesthesia Review:  PCP: Kerrie Buffalo  Cardiologist : none  Chest x-ray : EKG : 01/14/22  Echo : Stress test: Cardiac Cath :  Activity level: can do a flight of stairs without difficulty  Sleep Study/ CPAP : none  Fasting Blood Sugar :      / Checks Blood Sugar -- times a day:   Blood Thinner/ Instructions /Last Dose: ASA / Instructions/ Last Dose :    10/20/22- Inf disease LOV

## 2022-12-10 ENCOUNTER — Ambulatory Visit: Payer: Self-pay | Admitting: Student

## 2022-12-10 NOTE — Patient Instructions (Signed)
SURGICAL WAITING ROOM VISITATION  Patients having surgery or a procedure may have no more than 2 support people in the waiting area - these visitors may rotate.    Children under the age of 42 must have an adult with them who is not the patient.  Due to an increase in RSV and influenza rates and associated hospitalizations, children ages 5 and under may not visit patients in [facility] hospitals.  If the patient needs to stay at the hospital during part of their recovery, the visitor guidelines for inpatient rooms apply. Pre-op nurse will coordinate an appropriate time for 1 support person to accompany patient in pre-op.  This support person may not rotate.    Please refer to the [facility] website for the visitor guidelines for Inpatients (after your surgery is over and you are in a regular room).       Your procedure is scheduled on:  12/22/22    Report to [facility] Main Entrance    Report to admitting at  0800 AM   Call this number if you have problems the morning of surgery [phone]   Do not eat food :After Midnight.   After Midnight you may have the following liquids until ___ 0730___ AM  DAY OF SURGERY  Water Non-Citrus Juices (without pulp, NO RED-Apple, White grape, White cranberry) Black Coffee (NO MILK/CREAM OR CREAMERS, sugar ok)  Clear Tea (NO MILK/CREAM OR CREAMERS, sugar ok) regular and decaf                             Plain Jell-O (NO RED)                                           Fruit ices (not with fruit pulp, NO RED)                                     Popsicles (NO RED)                                                               Sports drinks like Gatorade (NO RED)                     The day of surgery:  Drink ONE (1) Pre-Surgery Clear Ensure or G2 at   0730 AM ( have completed by )  the morning of surgery. Drink in one sitting. Do not sip.  This drink was given to you during your hospital  pre-op appointment visit. Nothing else to  drink after completing the  Pre-Surgery Clear Ensure or G2.          If you have questions, please contact your surgeon's office.       Oral Hygiene is also important to reduce your risk of infection.                                    Remember - BRUSH YOUR TEETH THE MORNING  OF SURGERY WITH YOUR REGULAR TOOTHPASTE  DENTURES WILL BE REMOVED PRIOR TO SURGERY PLEASE DO NOT APPLY "Poly grip" OR ADHESIVES!!!   Do NOT smoke after Midnight   Take these medicines the morning of surgery with A SIP OF WATER:  none   DO NOT TAKE ANY ORAL DIABETIC MEDICATIONS DAY OF YOUR SURGERY  Bring CPAP mask and tubing day of surgery.                              You may not have any metal on your body including hair pins, jewelry, and body piercing             Do not wear make-up, lotions, powders, perfumes/cologne, or deodorant  Do not wear nail polish including gel and S&S, artificial/acrylic nails, or any other type of covering on natural nails including finger and toenails. If you have artificial nails, gel coating, etc. that needs to be removed by a nail salon please have this removed prior to surgery or surgery may need to be canceled/ delayed if the surgeon/ anesthesia feels like they are unable to be safely monitored.   Do not shave  48 hours prior to surgery.               Men may shave face and neck.   Do not bring valuables to the hospital. [place] IS NOT             RESPONSIBLE   FOR VALUABLES.   Contacts, glasses, dentures or bridgework may not be worn into surgery.   Bring small overnight bag day of surgery.   DO NOT BRING YOUR HOME MEDICATIONS TO THE HOSPITAL. PHARMACY WILL DISPENSE MEDICATIONS LISTED ON YOUR MEDICATION LIST TO YOU DURING YOUR ADMISSION IN THE HOSPITAL!    Patients discharged on the day of surgery will not be allowed to drive home.  Someone NEEDS to stay with you for the first 24 hours after anesthesia.   Special Instructions: Bring a copy of your healthcare  power of attorney and living will documents the day of surgery if you haven't scanned them before.              Please read over the following fact sheets you were given: IF YOU HAVE QUESTIONS ABOUT YOUR PRE-OP INSTRUCTIONS PLEASE CALL [phone]   If you received a COVID test during your pre-op visit  it is requested that you wear a mask when out in public, stay away from anyone that may not be feeling well and notify your surgeon if you develop symptoms. If you test positive for Covid or have been in contact with anyone that has tested positive in the last 10 days please notify you surgeon.

## 2022-12-13 ENCOUNTER — Other Ambulatory Visit: Payer: Self-pay

## 2022-12-13 ENCOUNTER — Encounter (HOSPITAL_COMMUNITY): Payer: Self-pay

## 2022-12-13 ENCOUNTER — Encounter (HOSPITAL_COMMUNITY)
Admission: RE | Admit: 2022-12-13 | Discharge: 2022-12-13 | Disposition: A | Discharge status: Home / Self Care | Payer: Medicare Other | Source: Ambulatory Visit | Attending: Orthopedic Surgery | Admitting: Orthopedic Surgery

## 2022-12-13 VITALS — BP 135/77 | HR 70 | Temp 98.3°F | Resp 16 | Ht 70.5 in | Wt 175.0 lb

## 2022-12-13 DIAGNOSIS — Z01818 Encounter for other preprocedural examination: Secondary | ICD-10-CM

## 2022-12-13 DIAGNOSIS — Z01812 Encounter for preprocedural laboratory examination: Secondary | ICD-10-CM | POA: Diagnosis not present

## 2022-12-13 LAB — BASIC METABOLIC PANEL
Anion gap: 6 (ref 5–15)
BUN: 14 mg/dL (ref 8–23)
CO2: 26 mmol/L (ref 22–32)
Calcium: 8.9 mg/dL (ref 8.9–10.3)
Chloride: 103 mmol/L (ref 98–111)
Creatinine, Ser: 0.76 mg/dL (ref 0.61–1.24)
GFR, Estimated: >60 mL/min (ref >60)
Glucose, Bld: 117 mg/dL — ABNORMAL HIGH (ref 70–99)
Potassium: 4.6 mmol/L (ref 3.5–5.1)
Sodium: 135 mmol/L (ref 135–145)

## 2022-12-13 LAB — CBC
HCT: 49.8 % (ref 39.0–52.0)
Hemoglobin: 15.7 g/dL (ref 13.0–17.0)
MCH: 26.7 pg (ref 26.0–34.0)
MCHC: 31.5 g/dL (ref 30.0–36.0)
MCV: 84.8 fL (ref 80.0–100.0)
Platelets: 210 10*3/uL (ref 150–400)
RBC: 5.87 MIL/uL — ABNORMAL HIGH (ref 4.22–5.81)
RDW: 15.5 % (ref 11.5–15.5)
WBC: 8.6 10*3/uL (ref 4.0–10.5)
nRBC: 0 % (ref 0.0–0.2)

## 2022-12-13 LAB — TYPE AND SCREEN: ABO/RH(D): O POS

## 2022-12-13 LAB — SURGICAL PCR SCREEN
MRSA, PCR: NEGATIVE
Staphylococcus aureus: NEGATIVE

## 2022-12-21 ENCOUNTER — Ambulatory Visit: Payer: Self-pay | Admitting: Student

## 2022-12-21 NOTE — H&P (Signed)
TOTAL KNEE REVISION ADMISSION H&P  Patient is being admitted for right revision total knee arthroplasty.  Subjective:  Chief Complaint:right knee pain.  HPI: Bryan Carroll, 72 y.o. male, has a history of pain and functional disability in the right knee(s) due to {trauma, arthritis, failed previous arthroplasty:3049042} and patient has failed non-surgical conservative treatments for greater than 12 weeks to include {nonsurgical conservative treatment (must select two):3049030}. The indications for the revision of the total knee arthroplasty are {loosening of components, fracture:3049043}. Onset of symptoms was {abrupt, gradual:20671} starting {1->10 years:3049031} years ago with {stable, gradual worsening, rapidly worsening:3049032} course since that time.  Prior procedures on the {Left/right:3049041} knee(s) include {arthroscopy, menisectomy:3049044}.  Patient currently rates pain in the {Left/right:3049041} knee(s) at {4-09:8119147} out of 10 with activity. There is {night pain, worsening of pain with activity and weight bearing:3049036}.  Patient has evidence of {Radiographic or MRI evidence of (must document one of the below):3049045} by imaging studies. This condition presents safety issues increasing the risk of falls. This patient has had {failure of previous osteotomy, distal femur fracture:3049037}.  There is no current active infection.  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   Flexion contracture of knee, right    post op total knee 05-26-2017   GERD (gastroesophageal reflux disease)    History of colon polyps    History of DVT of lower  extremity 06/13/2017   ED visit --  dvt right lower leg post op right total knee   Hypertension    Palpitations    per cardiologist note, dr Dietrich Pates, 2015--  24hr monitor show SB to ST w/  rare PVCs and rare PACs    Past Surgical History:  Procedure Laterality Date   EXAM UNDER ANESTHESIA WITH MANIPULATION OF KNEE Right 07/22/2017   Procedure: EXAM UNDER ANESTHESIA WITH MANIPULATION OF KNEE;  Surgeon: Jene Every, MD;  Location: [facility];  Service: Orthopedics;  Laterality: Right;  30 mins   EXCISIONAL TOTAL KNEE ARTHROPLASTY WITH ANTIBIOTIC SPACERS Right 08/19/2022   Procedure: EXCISIONAL TOTAL KNEE ARTHROPLASTY WITH ANTIBIOTIC SPACERS PLACEMENT;  Surgeon: Samson Frederic, MD;  Location: [facility];  Service: Orthopedics;  Laterality: Right;  240   INGUINAL HERNIA REPAIR Right 2003  approx.   TOTAL KNEE ARTHROPLASTY Right 05/26/2017   Procedure: RIGHT TOTAL KNEE ARTHROPLASTY;  Surgeon: Jene Every, MD;  Location: [facility];  Service: Orthopedics;  Laterality: Right;  120 mins    Current Outpatient Medications  Medication Sig Dispense Refill Last Dose   acetaminophen (TYLENOL) 325 MG tablet Take 1-2 tablets (325-650 mg total) by mouth every 6 (six) hours as needed for mild pain (pain score 1-3 or temp > 100.5).      lisinopril (PRINIVIL,ZESTRIL) 20 MG tablet Take 20 mg by mouth every morning.       Multiple Vitamin (MULTIVITAMIN ADULT PO) Take 1 tablet by mouth daily.      oxyCODONE (OXY IR/ROXICODONE) 5 MG immediate release tablet Take 1 tablet (5 mg total) by mouth every 4 (four) hours as needed for moderate pain or severe pain. 20 tablet 0    No current facility-administered medications for this  visit.   No Known Allergies  Social History   Tobacco Use   Smoking status: Never   Smokeless tobacco: Never  Substance Use Topics   Alcohol use: Never    Alcohol/week: 1.0 - 2.0 standard drink of alcohol    Types: 1 - 2 Glasses of wine per week    Comment:  occasionally     Family History  Problem Relation Age of Onset   Hypertension Mother    Heart disease Brother    Heart disease Brother       Review of Systems   Objective:  Physical Exam  Vital signs in last 24 hours: @VSRANGES @  Labs:  Estimated body mass index is 24.75 kg/m as calculated from the following:   Height as of 12/13/22: 5' 10.5" (1.791 m).   Weight as of 12/13/22: 79.4 kg.  Imaging Review Plain radiographs demonstrate {mild/mod/severe:3049053} degenerative joint disease of the {Left/right:3049041} knee(s). The overall alignment is {Neutral/varus:3049054}.There is evidence of loosening of the {femoral, tibial:3029046} components. The bone quality appears to be {good/fair/poor/excellent:33178} for age and reported activity level. There is ***.    Assessment/Plan:  End stage arthritis, {Left/right:3049041} knee(s) with failed previous arthroplasty.   The patient history, physical examination, clinical judgment of the provider and imaging studies are consistent with end stage degenerative joint disease of the {Left/right:3049041} knee(s), previous total knee arthroplasty. Revision total knee arthroplasty is deemed medically necessary. The treatment options including medical management, injection therapy, arthroscopy and revision arthroplasty were discussed at length. The risks and benefits of revision total knee arthroplasty were presented and reviewed. The risks due to aseptic loosening, infection, stiffness, patella tracking problems, thromboembolic complications and other imponderables were discussed. The patient acknowledged the explanation, agreed to proceed with the plan and consent was signed. Patient is being admitted for inpatient treatment for surgery, pain control, PT, OT, prophylactic antibiotics, VTE prophylaxis, progressive ambulation and ADL's and discharge planning.The patient is planning to be discharged {home with home health services/to inpatient  ZOXWR:[id number]}

## 2022-12-21 NOTE — H&P (View-Only) (Signed)
TOTAL KNEE REVISION ADMISSION H&P  Patient is being admitted for right knee antibiotic spacer removal and revision right total knee arthroplasty.  Subjective:  Chief Complaint:right knee pain.  HPI: Bryan Carroll, 72 y.o. male, has a history of pain and functional disability in the right knee(s) due to failed previous arthroplasty.The indications for the revision of the total knee arthroplasty are history of total knee infection. Onset of symptoms was gradual starting 1 years ago with rapidlly worsening course since that time.  Prior procedures on the right knee(s) include arthroplasty and placement of antibiotic spacer .  Patient currently rates pain in the right knee(s) at 2 out of 10 with activity.  This condition presents safety issues increasing the risk of falls. This patient has had  right knee periprosthetic joint infection .  There is no current active infection.  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   Flexion contracture of knee, right    post op total knee 05-26-2017   GERD (gastroesophageal reflux disease)    History of colon polyps    History of DVT of lower extremity 06/13/2017   ED visit --  dvt right lower leg post op right total knee   Hypertension    Palpitations    per cardiologist note, dr Dietrich Pates, 2015--  24hr monitor show SB to ST w/  rare PVCs and rare PACs    Past Surgical History:  Procedure Laterality Date   EXAM UNDER ANESTHESIA WITH MANIPULATION OF KNEE Right 07/22/2017   Procedure: EXAM UNDER ANESTHESIA WITH MANIPULATION OF KNEE;  Surgeon: Jene Every, MD;  Location: [facility];   Service: Orthopedics;  Laterality: Right;  30 mins   EXCISIONAL TOTAL KNEE ARTHROPLASTY WITH ANTIBIOTIC SPACERS Right 08/19/2022   Procedure: EXCISIONAL TOTAL KNEE ARTHROPLASTY WITH ANTIBIOTIC SPACERS PLACEMENT;  Surgeon: Samson Frederic, MD;  Location: [facility];  Service: Orthopedics;  Laterality: Right;  240   INGUINAL HERNIA REPAIR Right 2003  approx.   TOTAL KNEE ARTHROPLASTY Right 05/26/2017   Procedure: RIGHT TOTAL KNEE ARTHROPLASTY;  Surgeon: Jene Every, MD;  Location: [facility];  Service: Orthopedics;  Laterality: Right;  120 mins    Current Outpatient Medications  Medication Sig Dispense Refill Last Dose   acetaminophen (TYLENOL) 325 MG tablet Take 1-2 tablets (325-650 mg total) by mouth every 6 (six) hours as needed for mild pain (pain score 1-3 or temp > 100.5).      lisinopril (PRINIVIL,ZESTRIL) 20 MG tablet Take 20 mg by mouth every morning.       Multiple Vitamin (MULTIVITAMIN ADULT PO) Take 1 tablet by mouth daily.      oxyCODONE (OXY IR/ROXICODONE) 5 MG immediate release tablet Take 1 tablet (5 mg total) by mouth every 4 (four) hours as needed for moderate pain or severe pain. 20 tablet 0    No current facility-administered medications for this visit.   No Known Allergies  Social History   Tobacco Use   Smoking status: Never   Smokeless tobacco: Never  Substance Use Topics   Alcohol use: Never    Alcohol/week: 1.0 - 2.0 standard drink of alcohol  Types: 1 - 2 Glasses of wine per week    Comment: occasionally     Family History  Problem Relation Age of Onset   Hypertension Mother    Heart disease Brother    Heart disease Brother       Review of Systems  Musculoskeletal:  Positive for arthralgias.  All other systems reviewed and are negative.    Objective:  Physical Exam Constitutional:      Appearance: Normal appearance.  HENT:     Head: Normocephalic and atraumatic.     Nose: Nose normal.     Mouth/Throat:     Mouth: Mucous membranes are moist.      Pharynx: Oropharynx is clear.  Eyes:     Conjunctiva/sclera: Conjunctivae normal.  Cardiovascular:     Rate and Rhythm: Normal rate and regular rhythm.     Pulses: Normal pulses.     Heart sounds: Normal heart sounds.  Pulmonary:     Effort: Pulmonary effort is normal.     Breath sounds: Normal breath sounds.  Abdominal:     General: Abdomen is flat.     Palpations: Abdomen is soft.  Genitourinary:    Comments: deferred Musculoskeletal:     Cervical back: Normal range of motion and neck supple.     Comments: Examination of the right knee reveals a healed anterior incision. No dehiscence or drainage. No erythema. He can perform a straight leg raise without any extensor lag. Range of motion is 0 to greater than 90 degrees. No instability. Painless logrolling of the hip.   He is neurovascularly intact distally.  No significant pedal edema. No calf tenderness to palpation.  Skin:    General: Skin is warm and dry.     Capillary Refill: Capillary refill takes less than 2 seconds.  Neurological:     General: No focal deficit present.     Mental Status: He is alert and oriented to person, place, and time.  Psychiatric:        Mood and Affect: Mood normal.        Behavior: Behavior normal.        Thought Content: Thought content normal.        Judgment: Judgment normal.     Vital signs in last 24 hours: @VSRANGES @  Labs:  Estimated body mass index is 24.75 kg/m as calculated from the following:   Height as of 12/13/22: 5' 10.5" (1.791 m).   Weight as of 12/13/22: 79.4 kg.  Imaging Review Plain radiographs demonstrate antibiotic spacer of the right knee. The overall alignment is neutral.There is no evidence of loosening of the femoral and tibial components. The bone quality appears to be adequate for age and reported activity level.    Assessment/Plan:  End stage arthritis, right knee(s) with failed previous arthroplasty.   The patient history, physical examination,  clinical judgment of the provider and imaging studies are consistent with end stage degenerative joint disease of the right knee(s), previous total knee arthroplasty. Revision total knee arthroplasty is deemed medically necessary. The treatment options including medical management, injection therapy, arthroscopy and revision arthroplasty were discussed at length. The risks and benefits of revision total knee arthroplasty were presented and reviewed. The risks due to aseptic loosening, infection, stiffness, patella tracking problems, thromboembolic complications and other imponderables were discussed. The patient acknowledged the explanation, agreed to proceed with the plan and consent was signed. Patient is being admitted for inpatient treatment for surgery, pain control, PT, OT, prophylactic antibiotics, VTE prophylaxis,  progressive ambulation and ADL's and discharge planning.The patient is planning to be discharged home with OPPT after an overnight stay.   Therapy Plans: outpatient therapy.  PT at [facility] 12/29/22 Disposition: Home with family Planned DVT Prophylaxis: Eliquis 2.5mg  BID DME needed: Has rolling walker and ice machine.  PCP: Cleared, stop meloxicam 7 days prior to surgery.  Cardiology: Cleared TXA: IV Allergies: NDKA.  Anesthesia Concerns: None.  BMI: 28.7 Last HgbA1c: 5.8 Other: - History of RLE DVT, 06/13/2017. - ASA 81mg  at baseline.  - Was on IV ceftriaxone completed 09/30/22 - Oxycodone, zofran, meloxicam.  - 12/13/22: Hgb 15.7, Cr. 0.76, K+ 4.6

## 2022-12-22 ENCOUNTER — Inpatient Hospital Stay (HOSPITAL_COMMUNITY): Payer: Medicare Other | Admitting: Certified Registered"

## 2022-12-22 ENCOUNTER — Other Ambulatory Visit: Payer: Self-pay

## 2022-12-22 ENCOUNTER — Encounter (HOSPITAL_COMMUNITY)
Admission: RE | Disposition: A | Discharge status: Home / Self Care | Payer: Self-pay | Source: Home / Self Care | Attending: Orthopedic Surgery

## 2022-12-22 ENCOUNTER — Ambulatory Visit: Payer: Self-pay | Admitting: Student

## 2022-12-22 ENCOUNTER — Inpatient Hospital Stay (HOSPITAL_COMMUNITY): Payer: Medicare Other | Admitting: Physician Assistant

## 2022-12-22 ENCOUNTER — Inpatient Hospital Stay (HOSPITAL_COMMUNITY)
Admission: RE | Admit: 2022-12-22 | Discharge: 2022-12-24 | DRG: 468 | Disposition: A | Discharge status: Home / Self Care | Payer: Medicare Other | Attending: Orthopedic Surgery | Admitting: Orthopedic Surgery

## 2022-12-22 ENCOUNTER — Encounter (HOSPITAL_COMMUNITY): Payer: Self-pay | Admitting: Orthopedic Surgery

## 2022-12-22 ENCOUNTER — Inpatient Hospital Stay (HOSPITAL_COMMUNITY): Payer: Medicare Other

## 2022-12-22 DIAGNOSIS — Z96651 Presence of right artificial knee joint: Secondary | ICD-10-CM | POA: Diagnosis not present

## 2022-12-22 DIAGNOSIS — I10 Essential (primary) hypertension: Secondary | ICD-10-CM

## 2022-12-22 DIAGNOSIS — I8290 Acute embolism and thrombosis of unspecified vein: Secondary | ICD-10-CM

## 2022-12-22 DIAGNOSIS — G8918 Other acute postprocedural pain: Secondary | ICD-10-CM | POA: Diagnosis not present

## 2022-12-22 DIAGNOSIS — Z8249 Family history of ischemic heart disease and other diseases of the circulatory system: Secondary | ICD-10-CM

## 2022-12-22 DIAGNOSIS — T8453XA Infection and inflammatory reaction due to internal right knee prosthesis, initial encounter: Principal | ICD-10-CM | POA: Diagnosis present

## 2022-12-22 DIAGNOSIS — I82409 Acute embolism and thrombosis of unspecified deep veins of unspecified lower extremity: Secondary | ICD-10-CM | POA: Diagnosis not present

## 2022-12-22 DIAGNOSIS — Z86010 Personal history of colonic polyps: Secondary | ICD-10-CM | POA: Diagnosis not present

## 2022-12-22 DIAGNOSIS — Y831 Surgical operation with implant of artificial internal device as the cause of abnormal reaction of the patient, or of later complication, without mention of misadventure at the time of the procedure: Secondary | ICD-10-CM | POA: Diagnosis present

## 2022-12-22 DIAGNOSIS — K219 Gastro-esophageal reflux disease without esophagitis: Secondary | ICD-10-CM | POA: Diagnosis not present

## 2022-12-22 DIAGNOSIS — Z01818 Encounter for other preprocedural examination: Secondary | ICD-10-CM

## 2022-12-22 DIAGNOSIS — Z86718 Personal history of other venous thrombosis and embolism: Secondary | ICD-10-CM | POA: Diagnosis not present

## 2022-12-22 DIAGNOSIS — T8453XD Infection and inflammatory reaction due to internal right knee prosthesis, subsequent encounter: Principal | ICD-10-CM

## 2022-12-22 DIAGNOSIS — Z471 Aftercare following joint replacement surgery: Secondary | ICD-10-CM | POA: Diagnosis not present

## 2022-12-22 DIAGNOSIS — Z79899 Other long term (current) drug therapy: Secondary | ICD-10-CM

## 2022-12-22 DIAGNOSIS — T8453XS Infection and inflammatory reaction due to internal right knee prosthesis, sequela: Secondary | ICD-10-CM | POA: Diagnosis not present

## 2022-12-22 DIAGNOSIS — M1711 Unilateral primary osteoarthritis, right knee: Secondary | ICD-10-CM | POA: Diagnosis present

## 2022-12-22 DIAGNOSIS — M25561 Pain in right knee: Secondary | ICD-10-CM | POA: Diagnosis not present

## 2022-12-22 HISTORY — PX: REMOVAL OF CEMENTED SPACER KNEE: SHX6056

## 2022-12-22 HISTORY — PX: TOTAL KNEE REVISION: SHX996

## 2022-12-22 LAB — TYPE AND SCREEN: Antibody Screen: NEGATIVE

## 2022-12-22 LAB — ABO/RH: ABO/RH(D): O POS

## 2022-12-22 LAB — AEROBIC/ANAEROBIC CULTURE W GRAM STAIN (SURGICAL/DEEP WOUND)

## 2022-12-22 SURGERY — REMOVAL, SPACER, KNEE
Anesthesia: Spinal | Site: Knee | Laterality: Right

## 2022-12-22 MED ORDER — ACETAMINOPHEN 500 MG PO TABS
1000.0000 mg | ORAL_TABLET | Freq: Four times a day (QID) | ORAL | Status: AC
  Administered 2022-12-22 – 2022-12-23 (×4): 1000 mg via ORAL
  Filled 2022-12-22 (×4): qty 2

## 2022-12-22 MED ORDER — BISACODYL 10 MG RE SUPP: 10.0000 mg | Freq: Every day | RECTAL | Status: DC | PRN

## 2022-12-22 MED ORDER — OXYCODONE HCL 5 MG PO TABS
5.0000 mg | ORAL_TABLET | ORAL | Status: DC | PRN
  Administered 2022-12-23 – 2022-12-24 (×4): 10 mg via ORAL
  Filled 2022-12-22 (×6): qty 2

## 2022-12-22 MED ORDER — TRANEXAMIC ACID-NACL 1000-0.7 MG/100ML-% IV SOLN
1000.0000 mg | INTRAVENOUS | Status: AC
  Administered 2022-12-22: 1000 mg via INTRAVENOUS
  Filled 2022-12-22: qty 100

## 2022-12-22 MED ORDER — PANTOPRAZOLE SODIUM 40 MG PO TBEC
40.0000 mg | DELAYED_RELEASE_TABLET | Freq: Every day | ORAL | Status: DC
  Administered 2022-12-22 – 2022-12-24 (×3): 40 mg via ORAL
  Filled 2022-12-22 (×3): qty 1

## 2022-12-22 MED ORDER — ISOPROPYL ALCOHOL 70 % SOLN
Status: AC
  Filled 2022-12-22: qty 480

## 2022-12-22 MED ORDER — LACTATED RINGERS IV SOLN: INTRAVENOUS | Status: DC

## 2022-12-22 MED ORDER — MIDAZOLAM HCL 2 MG/2ML IJ SOLN: 2.0000 mg | Freq: Once | INTRAMUSCULAR | Status: DC

## 2022-12-22 MED ORDER — PROPOFOL 500 MG/50ML IV EMUL
INTRAVENOUS | Status: DC | PRN
  Administered 2022-12-22: 25 ug/kg/min via INTRAVENOUS

## 2022-12-22 MED ORDER — SENNA 8.6 MG PO TABS
1.0000 | ORAL_TABLET | Freq: Two times a day (BID) | ORAL | Status: DC
  Administered 2022-12-22 – 2022-12-24 (×4): 8.6 mg via ORAL
  Filled 2022-12-22 (×4): qty 1

## 2022-12-22 MED ORDER — BUPIVACAINE HCL (PF) 0.25 % IJ SOLN
INTRAMUSCULAR | Status: AC
  Filled 2022-12-22: qty 30

## 2022-12-22 MED ORDER — STERILE WATER FOR IRRIGATION IR SOLN
Status: DC | PRN
  Administered 2022-12-22: 2000 mL

## 2022-12-22 MED ORDER — DIPHENHYDRAMINE HCL 12.5 MG/5ML PO ELIX: 12.5000 mg | ORAL_SOLUTION | ORAL | Status: DC | PRN

## 2022-12-22 MED ORDER — METHOCARBAMOL 1000 MG/10ML IJ SOLN: 500.0000 mg | Freq: Four times a day (QID) | INTRAVENOUS | Status: DC | PRN

## 2022-12-22 MED ORDER — DOCUSATE SODIUM 100 MG PO CAPS
100.0000 mg | ORAL_CAPSULE | Freq: Two times a day (BID) | ORAL | Status: DC
  Administered 2022-12-22 – 2022-12-24 (×4): 100 mg via ORAL
  Filled 2022-12-22 (×4): qty 1

## 2022-12-22 MED ORDER — BUPIVACAINE-EPINEPHRINE 0.25% -1:200000 IJ SOLN: INTRAMUSCULAR | Status: DC | PRN

## 2022-12-22 MED ORDER — GLYCOPYRROLATE PF 0.2 MG/ML IJ SOSY
PREFILLED_SYRINGE | INTRAMUSCULAR | Status: DC | PRN
  Administered 2022-12-22: .2 mg via INTRAVENOUS

## 2022-12-22 MED ORDER — PROMETHAZINE HCL 25 MG/ML IJ SOLN: 6.2500 mg | INTRAMUSCULAR | Status: DC | PRN

## 2022-12-22 MED ORDER — PHENYLEPHRINE HCL (PRESSORS) 10 MG/ML IV SOLN
INTRAVENOUS | Status: AC
  Filled 2022-12-22: qty 1

## 2022-12-22 MED ORDER — FENTANYL CITRATE (PF) 100 MCG/2ML IJ SOLN
INTRAMUSCULAR | Status: AC
  Filled 2022-12-22: qty 2

## 2022-12-22 MED ORDER — FENTANYL CITRATE PF 50 MCG/ML IJ SOSY
100.0000 ug | PREFILLED_SYRINGE | Freq: Once | INTRAMUSCULAR | Status: AC
  Administered 2022-12-22: 100 ug via INTRAVENOUS
  Filled 2022-12-22: qty 2

## 2022-12-22 MED ORDER — HYDROMORPHONE HCL 1 MG/ML IJ SOLN: 0.5000 mg | INTRAMUSCULAR | Status: DC | PRN

## 2022-12-22 MED ORDER — ONDANSETRON HCL 4 MG PO TABS
4.0000 mg | ORAL_TABLET | Freq: Four times a day (QID) | ORAL | Status: DC | PRN
  Administered 2022-12-22: 4 mg via ORAL

## 2022-12-22 MED ORDER — ROPIVACAINE HCL 7.5 MG/ML IJ SOLN
INTRAMUSCULAR | Status: DC | PRN
  Administered 2022-12-22: 20 mL

## 2022-12-22 MED ORDER — CHLORHEXIDINE GLUCONATE 0.12 % MT SOLN
15.0000 mL | Freq: Once | OROMUCOSAL | Status: AC
  Administered 2022-12-22: 15 mL via OROMUCOSAL

## 2022-12-22 MED ORDER — SODIUM CHLORIDE (PF) 0.9 % IJ SOLN
INTRAMUSCULAR | Status: AC
  Filled 2022-12-22: qty 50

## 2022-12-22 MED ORDER — METOCLOPRAMIDE HCL 5 MG/ML IJ SOLN
5.0000 mg | Freq: Three times a day (TID) | INTRAMUSCULAR | Status: DC | PRN
  Filled 2022-12-22: qty 2

## 2022-12-22 MED ORDER — MEPERIDINE HCL 50 MG/ML IJ SOLN: 6.2500 mg | INTRAMUSCULAR | Status: DC | PRN

## 2022-12-22 MED ORDER — BUPIVACAINE IN DEXTROSE 0.75-8.25 % IT SOLN
INTRATHECAL | Status: DC | PRN
  Administered 2022-12-22: 12 mg via INTRATHECAL

## 2022-12-22 MED ORDER — MENTHOL 3 MG MT LOZG: 1.0000 | LOZENGE | OROMUCOSAL | Status: DC | PRN

## 2022-12-22 MED ORDER — ONDANSETRON HCL 4 MG/2ML IJ SOLN: 4.0000 mg | Freq: Four times a day (QID) | INTRAMUSCULAR | Status: DC | PRN

## 2022-12-22 MED ORDER — METOCLOPRAMIDE HCL 5 MG PO TABS: 5.0000 mg | ORAL_TABLET | Freq: Three times a day (TID) | ORAL | Status: DC | PRN

## 2022-12-22 MED ORDER — HYDROMORPHONE HCL 1 MG/ML IJ SOLN
0.2500 mg | INTRAMUSCULAR | Status: DC | PRN
  Administered 2022-12-22: 0.5 mg via INTRAVENOUS

## 2022-12-22 MED ORDER — 0.9 % SODIUM CHLORIDE (POUR BTL) OPTIME
TOPICAL | Status: DC | PRN
  Administered 2022-12-22: 1000 mL

## 2022-12-22 MED ORDER — ISOPROPYL ALCOHOL 70 % SOLN
Status: DC | PRN
  Administered 2022-12-22: 1 via TOPICAL

## 2022-12-22 MED ORDER — OXYCODONE HCL 5 MG/5ML PO SOLN: 5.0000 mg | Freq: Once | ORAL | Status: AC | PRN

## 2022-12-22 MED ORDER — SODIUM CHLORIDE 0.9 % IV SOLN: INTRAVENOUS | Status: DC

## 2022-12-22 MED ORDER — KETOROLAC TROMETHAMINE 30 MG/ML IJ SOLN
INTRAMUSCULAR | Status: AC
  Filled 2022-12-22: qty 1

## 2022-12-22 MED ORDER — SODIUM CHLORIDE 0.9 % IR SOLN
Status: DC | PRN
  Administered 2022-12-22 (×2): 1000 mL

## 2022-12-22 MED ORDER — ONDANSETRON HCL 4 MG/2ML IJ SOLN
INTRAMUSCULAR | Status: AC
  Filled 2022-12-22: qty 2

## 2022-12-22 MED ORDER — HYDROMORPHONE HCL 1 MG/ML IJ SOLN
INTRAMUSCULAR | Status: AC
  Administered 2022-12-22: 0.5 mg via INTRAVENOUS
  Filled 2022-12-22: qty 1

## 2022-12-22 MED ORDER — APIXABAN 2.5 MG PO TABS
2.5000 mg | ORAL_TABLET | Freq: Two times a day (BID) | ORAL | Status: DC
  Administered 2022-12-23 – 2022-12-24 (×3): 2.5 mg via ORAL
  Filled 2022-12-22 (×3): qty 1

## 2022-12-22 MED ORDER — PROPOFOL 10 MG/ML IV BOLUS
INTRAVENOUS | Status: DC | PRN
  Administered 2022-12-22: 20 mg via INTRAVENOUS
  Administered 2022-12-22: 10 mg via INTRAVENOUS
  Administered 2022-12-22: 30 mg via INTRAVENOUS
  Administered 2022-12-22 (×2): 20 mg via INTRAVENOUS
  Administered 2022-12-22: 30 mg via INTRAVENOUS
  Administered 2022-12-22 (×4): 20 mg via INTRAVENOUS

## 2022-12-22 MED ORDER — OXYCODONE HCL 5 MG PO TABS
5.0000 mg | ORAL_TABLET | Freq: Once | ORAL | Status: AC | PRN
  Administered 2022-12-22: 5 mg via ORAL

## 2022-12-22 MED ORDER — PROPOFOL 1000 MG/100ML IV EMUL
INTRAVENOUS | Status: AC
  Filled 2022-12-22: qty 200

## 2022-12-22 MED ORDER — SODIUM CHLORIDE (PF) 0.9 % IJ SOLN: INTRAMUSCULAR | Status: DC | PRN

## 2022-12-22 MED ORDER — ACETAMINOPHEN 500 MG PO TABS: 1000.0000 mg | ORAL_TABLET | Freq: Once | ORAL | Status: DC

## 2022-12-22 MED ORDER — PHENOL 1.4 % MT LIQD: 1.0000 | OROMUCOSAL | Status: DC | PRN

## 2022-12-22 MED ORDER — KETOROLAC TROMETHAMINE 30 MG/ML IJ SOLN: INTRAMUSCULAR | Status: DC | PRN

## 2022-12-22 MED ORDER — CEPHALEXIN 500 MG PO CAPS: 500.0000 mg | ORAL_CAPSULE | Freq: Two times a day (BID) | ORAL | 12 refills | Status: AC

## 2022-12-22 MED ORDER — APIXABAN 2.5 MG PO TABS: 2.5000 mg | ORAL_TABLET | Freq: Two times a day (BID) | ORAL | 0 refills | Status: DC

## 2022-12-22 MED ORDER — ONDANSETRON HCL 4 MG/2ML IJ SOLN
INTRAMUSCULAR | Status: DC | PRN
  Administered 2022-12-22: 4 mg via INTRAVENOUS

## 2022-12-22 MED ORDER — PHENYLEPHRINE HCL-NACL 20-0.9 MG/250ML-% IV SOLN
INTRAVENOUS | Status: DC | PRN
  Administered 2022-12-22: 50 ug/min via INTRAVENOUS

## 2022-12-22 MED ORDER — OXYCODONE HCL 5 MG PO TABS
10.0000 mg | ORAL_TABLET | ORAL | Status: DC | PRN
  Administered 2022-12-22: 10 mg via ORAL

## 2022-12-22 MED ORDER — ALUM & MAG HYDROXIDE-SIMETH 200-200-20 MG/5ML PO SUSP: 30.0000 mL | ORAL | Status: DC | PRN

## 2022-12-22 MED ORDER — DEXAMETHASONE SODIUM PHOSPHATE 10 MG/ML IJ SOLN
INTRAMUSCULAR | Status: AC
  Filled 2022-12-22: qty 1

## 2022-12-22 MED ORDER — ACETAMINOPHEN 500 MG PO TABS
1000.0000 mg | ORAL_TABLET | Freq: Once | ORAL | Status: AC
  Administered 2022-12-22: 1000 mg via ORAL
  Filled 2022-12-22: qty 2

## 2022-12-22 MED ORDER — EPINEPHRINE PF 1 MG/ML IJ SOLN
INTRAMUSCULAR | Status: AC
  Filled 2022-12-22: qty 1

## 2022-12-22 MED ORDER — HYDROMORPHONE HCL 2 MG/ML IJ SOLN
INTRAMUSCULAR | Status: AC
  Filled 2022-12-22: qty 1

## 2022-12-22 MED ORDER — POVIDONE-IODINE 10 % EX SWAB: 2.0000 | Freq: Once | CUTANEOUS | Status: DC

## 2022-12-22 MED ORDER — ORAL CARE MOUTH RINSE: 15.0000 mL | Freq: Once | OROMUCOSAL | Status: AC

## 2022-12-22 MED ORDER — CEFAZOLIN SODIUM-DEXTROSE 2-4 GM/100ML-% IV SOLN
2.0000 g | INTRAVENOUS | Status: AC
  Administered 2022-12-22: 2 g via INTRAVENOUS
  Filled 2022-12-22: qty 100

## 2022-12-22 MED ORDER — MIDAZOLAM HCL 2 MG/2ML IJ SOLN: 0.5000 mg | Freq: Once | INTRAMUSCULAR | Status: DC | PRN

## 2022-12-22 MED ORDER — CEFAZOLIN SODIUM-DEXTROSE 2-4 GM/100ML-% IV SOLN
2.0000 g | Freq: Four times a day (QID) | INTRAVENOUS | Status: AC
  Administered 2022-12-22 (×2): 2 g via INTRAVENOUS
  Filled 2022-12-22 (×2): qty 100

## 2022-12-22 MED ORDER — PROPOFOL 10 MG/ML IV BOLUS
INTRAVENOUS | Status: AC
  Filled 2022-12-22: qty 20

## 2022-12-22 MED ORDER — OXYCODONE HCL 5 MG PO TABS
ORAL_TABLET | ORAL | Status: AC
  Filled 2022-12-22: qty 1

## 2022-12-22 MED ORDER — ACETAMINOPHEN 325 MG PO TABS
325.0000 mg | ORAL_TABLET | Freq: Four times a day (QID) | ORAL | Status: DC | PRN
  Administered 2022-12-24: 650 mg via ORAL
  Filled 2022-12-22: qty 2

## 2022-12-22 MED ORDER — HYDROMORPHONE HCL 1 MG/ML IJ SOLN
INTRAMUSCULAR | Status: DC | PRN
  Administered 2022-12-22 (×3): .5 mg via INTRAVENOUS

## 2022-12-22 MED ORDER — VANCOMYCIN HCL 1000 MG IV SOLR
INTRAVENOUS | Status: AC
  Filled 2022-12-22: qty 20

## 2022-12-22 MED ORDER — POLYETHYLENE GLYCOL 3350 17 G PO PACK: 17.0000 g | PACK | Freq: Every day | ORAL | Status: DC | PRN

## 2022-12-22 MED ORDER — CELECOXIB 200 MG PO CAPS
200.0000 mg | ORAL_CAPSULE | Freq: Two times a day (BID) | ORAL | Status: DC
  Administered 2022-12-22 – 2022-12-24 (×4): 200 mg via ORAL
  Filled 2022-12-22 (×4): qty 1

## 2022-12-22 MED ORDER — METHOCARBAMOL 500 MG PO TABS
500.0000 mg | ORAL_TABLET | Freq: Four times a day (QID) | ORAL | Status: DC | PRN
  Administered 2022-12-22 – 2022-12-23 (×4): 500 mg via ORAL
  Filled 2022-12-22 (×4): qty 1

## 2022-12-22 MED ORDER — VANCOMYCIN HCL IN DEXTROSE 1-5 GM/200ML-% IV SOLN
1000.0000 mg | Freq: Once | INTRAVENOUS | Status: AC
  Administered 2022-12-22: 1000 mg via INTRAVENOUS
  Filled 2022-12-22: qty 200

## 2022-12-22 MED ORDER — FENTANYL CITRATE (PF) 100 MCG/2ML IJ SOLN
INTRAMUSCULAR | Status: DC | PRN
  Administered 2022-12-22 (×2): 50 ug via INTRAVENOUS

## 2022-12-22 SURGICAL SUPPLY — 96 items
ADH SKN CLS APL DERMABOND .7 (GAUZE/BANDAGES/DRESSINGS) ×2
ANCH SUT 2 JK 1.5X2.9 2 LD (Anchor) ×1 IMPLANT
ANCHOR SUT JK SZ 2 2.9 DBL SL (Anchor) IMPLANT
APL PRP STRL LF DISP 70% ISPRP (MISCELLANEOUS) ×2
AUG TIB MED CONE CNTR STRL LF (Joint) ×1 IMPLANT
BAG COUNTER SPONGE SURGICOUNT (BAG) IMPLANT
BAG DECANTER FOR FLEXI CONT (MISCELLANEOUS) ×2 IMPLANT
BAG SPEC THK2 15X12 ZIP CLS (MISCELLANEOUS) ×1
BAG SPNG CNTER NS LX DISP (BAG) ×1
BAG ZIPLOCK 12X15 (MISCELLANEOUS) ×1 IMPLANT
BLADE SAW RECIPROCATING 77.5 (BLADE) ×1 IMPLANT
BLADE SAW SGTL 81X20 HD (BLADE) ×1 IMPLANT
BLADE SURG SZ10 CARB STEEL (BLADE) ×1 IMPLANT
BNDG CMPR 5X62 HK CLSR LF (GAUZE/BANDAGES/DRESSINGS) ×1
BNDG CMPR MED 10X6 ELC LF (GAUZE/BANDAGES/DRESSINGS) ×2
BNDG ELASTIC 6INX 5YD STR LF (GAUZE/BANDAGES/DRESSINGS) ×1 IMPLANT
BNDG ELASTIC 6X10 VLCR STRL LF (GAUZE/BANDAGES/DRESSINGS) IMPLANT
BONE CEMENT SIMPLEX TOBRAMYCIN (Cement) IMPLANT
CEMENT BONE R 1X40 (Cement) IMPLANT
CEMENT BONE REFOBACIN R1X40 US (Cement) IMPLANT
CEMENT RESTRICTOR DEPUY SZ 3 (Cement) IMPLANT
CEMENT RESTRICTOR DEPUY SZ 4 (Cement) IMPLANT
CHLORAPREP W/TINT 26 (MISCELLANEOUS) ×2 IMPLANT
COMP FEM CMT PS PLUS 11 RT (Joint) ×1 IMPLANT
COMPONENT FEM CMT PS PLUS 11RT (Joint) IMPLANT
COMPONENT TIB PSN REV RT SZG (Miscellaneous) ×1 IMPLANT
COMPONET TIB PSN REV RT SZG (Miscellaneous) IMPLANT
CONE TIB CENTRAL TM MED (Joint) IMPLANT
COVER SURGICAL LIGHT HANDLE (MISCELLANEOUS) ×1 IMPLANT
CUFF TOURN SGL QUICK 34 (TOURNIQUET CUFF) ×1
CUFF TRNQT CYL 34X4.125X (TOURNIQUET CUFF) ×1 IMPLANT
DERMABOND ADVANCED .7 DNX12 (GAUZE/BANDAGES/DRESSINGS) ×2 IMPLANT
DRAPE INCISE IOBAN 66X45 STRL (DRAPES) ×3 IMPLANT
DRAPE SHEET LG 3/4 BI-LAMINATE (DRAPES) ×3 IMPLANT
DRAPE U-SHAPE 47X51 STRL (DRAPES) ×1 IMPLANT
DRESSING PREVENA PLUS CUSTOM (GAUZE/BANDAGES/DRESSINGS) IMPLANT
DRSG AQUACEL AG ADV 3.5X10 (GAUZE/BANDAGES/DRESSINGS) ×1 IMPLANT
DRSG PREVENA PLUS CUSTOM (GAUZE/BANDAGES/DRESSINGS) ×1
DRSG TEGADERM 4X4.75 (GAUZE/BANDAGES/DRESSINGS) IMPLANT
ELECT BLADE TIP CTD 4 INCH (ELECTRODE) ×1 IMPLANT
ELECT REM PT RETURN 15FT ADLT (MISCELLANEOUS) ×1 IMPLANT
EVACUATOR 1/8 PVC DRAIN (DRAIN) ×1 IMPLANT
GAUZE SPONGE 2X2 8PLY STRL LF (GAUZE/BANDAGES/DRESSINGS) IMPLANT
GLOVE BIO SURGEON STRL SZ8.5 (GLOVE) ×2 IMPLANT
GLOVE BIOGEL M 7.0 STRL (GLOVE) ×1 IMPLANT
GLOVE BIOGEL PI IND STRL 7.5 (GLOVE) ×1 IMPLANT
GLOVE BIOGEL PI IND STRL 8 (GLOVE) ×1 IMPLANT
GLOVE BIOGEL PI IND STRL 8.5 (GLOVE) ×1 IMPLANT
GLOVE SURG LX STRL 7.5 STRW (GLOVE) ×2 IMPLANT
GOWN SPEC L3 XXLG W/TWL (GOWN DISPOSABLE) ×2 IMPLANT
HANDPIECE INTERPULSE COAX TIP (DISPOSABLE) ×1
HDLS TROCR DRIL PIN KNEE 75 (PIN) ×1
HOLDER FOLEY CATH W/STRAP (MISCELLANEOUS) IMPLANT
HOOD PEEL AWAY T7 (MISCELLANEOUS) ×4 IMPLANT
IMMOBILIZER KNEE 22 UNIV (SOFTGOODS) IMPLANT
IMPL TAPESTRY BIOINTEGR 50X40 (Mesh General) IMPLANT
IMPL TAPESTRY BIOINTEGR 70X50 (Orthopedic Implant) IMPLANT
INSTR SCRW HEX REV FIX 3.5X48 (ORTHOPEDIC DISPOSABLE SUPPLIES) ×1
INSTRUMENT SCRW HEX REV 3.5X48 (ORTHOPEDIC DISPOSABLE SUPPLIES) IMPLANT
JET LAVAGE IRRISEPT WOUND (IRRIGATION / IRRIGATOR) ×1
KIT TURNOVER KIT A (KITS) IMPLANT
LAVAGE JET IRRISEPT WOUND (IRRIGATION / IRRIGATOR) IMPLANT
LINER TIB PS GH/10-12 12 RT (Liner) IMPLANT
MANIFOLD NEPTUNE II (INSTRUMENTS) ×1 IMPLANT
MARKER SKIN DUAL TIP RULER LAB (MISCELLANEOUS) ×1 IMPLANT
NDL SPNL 18GX3.5 QUINCKE PK (NEEDLE) ×1 IMPLANT
NEEDLE SPNL 18GX3.5 QUINCKE PK (NEEDLE) ×1 IMPLANT
NS IRRIG 1000ML POUR BTL (IV SOLUTION) ×1 IMPLANT
PACK TOTAL KNEE CUSTOM (KITS) ×1 IMPLANT
PADDING CAST COTTON 6X4 STRL (CAST SUPPLIES) IMPLANT
PIN DRILL HDLS TROCAR 75 4PK (PIN) IMPLANT
PROTECTOR NERVE ULNAR (MISCELLANEOUS) ×1 IMPLANT
RESTRICTOR CEMENT SZ 5 C-STEM (Cement) IMPLANT
SAW OSC TIP CART 19.5X105X1.3 (SAW) ×1 IMPLANT
SEALER BIPOLAR AQUA 6.0 (INSTRUMENTS) ×1 IMPLANT
SET HNDPC FAN SPRY TIP SCT (DISPOSABLE) ×1 IMPLANT
SET PAD KNEE POSITIONER (MISCELLANEOUS) ×1 IMPLANT
SPIKE FLUID TRANSFER (MISCELLANEOUS) IMPLANT
SPONGE DRAIN TRACH 4X4 STRL 2S (GAUZE/BANDAGES/DRESSINGS) ×1 IMPLANT
STEM FEM CMTLS EXT 18X135X6 (Stem) IMPLANT
STEM STRT REV EXT 24X135 (Stem) IMPLANT
SUT MNCRL AB 3-0 PS2 18 (SUTURE) ×1 IMPLANT
SUT MON AB 2-0 CT1 36 (SUTURE) ×2 IMPLANT
SUT STRATAFIX PDO 1 14 VIOLET (SUTURE) ×1
SUT STRATFX PDO 1 14 VIOLET (SUTURE) ×1
SUT VIC AB 1 CTX 36 (SUTURE) ×2
SUT VIC AB 1 CTX36XBRD ANBCTR (SUTURE) ×2 IMPLANT
SUT VIC AB 2-0 CT1 27 (SUTURE) ×1
SUT VIC AB 2-0 CT1 TAPERPNT 27 (SUTURE) ×1 IMPLANT
SUTURE STRATFX PDO 1 14 VIOLET (SUTURE) ×1 IMPLANT
TOWER CARTRIDGE SMART MIX (DISPOSABLE) ×2 IMPLANT
TRAY FOLEY MTR SLVR 16FR STAT (SET/KITS/TRAYS/PACK) ×1 IMPLANT
TUBE SUCTION HIGH CAP CLEAR NV (SUCTIONS) ×1 IMPLANT
WATER STERILE IRR 1000ML POUR (IV SOLUTION) ×1 IMPLANT
WEDGE FEM DIST KNEE PS 11 5 (Joint) IMPLANT
WRAP KNEE MAXI GEL POST OP (GAUZE/BANDAGES/DRESSINGS) ×1 IMPLANT

## 2022-12-22 NOTE — Anesthesia Preprocedure Evaluation (Addendum)
Anesthesia Evaluation  Patient identified by MRN, date of birth, ID band Patient awake    Reviewed: Allergy & Precautions, NPO status , Patient's Chart, lab work & pertinent test results  Airway Mallampati: I  TM Distance: >3 FB Neck ROM: Full    Dental  (+) Dental Advisory Given, Teeth Intact   Pulmonary neg pulmonary ROS   Pulmonary exam normal        Cardiovascular hypertension, Pt. on medications + DVT  Normal cardiovascular exam     Neuro/Psych negative neurological ROS  negative psych ROS   GI/Hepatic Neg liver ROS,GERD  Controlled,,  Endo/Other  negative endocrine ROS    Renal/GU negative Renal ROS     Musculoskeletal  (+) Arthritis ,    Abdominal   Peds  Hematology negative hematology ROS (+)   Anesthesia Other Findings   Reproductive/Obstetrics                             Anesthesia Physical Anesthesia Plan  ASA: 3  Anesthesia Plan: Spinal   Post-op Pain Management: Regional block* and Tylenol PO (pre-op)*   Induction:   PONV Risk Score and Plan: 1 and Treatment may vary due to age or medical condition and Propofol infusion  Airway Management Planned: Natural Airway and Simple Face Mask  Additional Equipment: None  Intra-op Plan:   Post-operative Plan:   Informed Consent: I have reviewed the patients History and Physical, chart, labs and discussed the procedure including the risks, benefits and alternatives for the proposed anesthesia with the patient or authorized representative who has indicated his/her understanding and acceptance.       Plan Discussed with: CRNA and Anesthesiologist  Anesthesia Plan Comments: (Plan routine monitors, SAB with adductor canal block for post op analgesia)       Anesthesia Quick Evaluation

## 2022-12-22 NOTE — Anesthesia Postprocedure Evaluation (Signed)
Anesthesia Post Note  Patient: Bryan Carroll  Procedure(s) Performed: REMOVAL OF  SPACER KNEE (Right: Knee) TOTAL KNEE REVISION (Right: Knee)     Patient location during evaluation: PACU Anesthesia Type: Spinal Level of consciousness: awake and alert, patient cooperative and oriented Pain management: pain level controlled Vital Signs Assessment: post-procedure vital signs reviewed and stable Respiratory status: nonlabored ventilation, spontaneous breathing and respiratory function stable Cardiovascular status: blood pressure returned to baseline and stable Postop Assessment: no apparent nausea or vomiting, adequate PO intake, spinal receding and patient able to bend at knees Anesthetic complications: no   No notable events documented.  Last Vitals:  Vitals:   12/22/22 1530 12/22/22 1553  BP: 117/63 111/60  Pulse: 62 67  Resp: 10 17  Temp: 36.6 C   SpO2: 93% 91%    Last Pain:  Vitals:   12/22/22 1530  TempSrc:   PainSc: Asleep                 Bryan Carroll,E. Bogdan Vivona

## 2022-12-22 NOTE — Discharge Instructions (Signed)

## 2022-12-22 NOTE — Interval H&P Note (Signed)
History and Physical Interval Note:  12/22/2022 9:18 AM  Bryan Carroll  has presented today for surgery, with the diagnosis of Infection assiciated with right knee prosthesis.  The various methods of treatment have been discussed with the patient and family. After consideration of risks, benefits and other options for treatment, the patient has consented to  Procedure(s): REMOVAL OF  SPACER KNEE (Right) TOTAL KNEE REVISION (Right) as a surgical intervention.  [name] history has been reviewed, patient examined, no change in status, stable for surgery.  I have reviewed the patient's chart and labs.  Questions were answered to the patient's satisfaction.     Iline Oven Ahmira Boisselle

## 2022-12-22 NOTE — Op Note (Addendum)
OPERATIVE REPORT   12/22/2022  1:43 PM  PATIENT:  Bryan Carroll   SURGEON:  Jonette Pesa, MD  ASSISTANT:  Clint Bolder, PA-C.   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  POSTOPERATIVE DIAGNOSIS:  Same.  PROCEDURE:  1.  Resection of right knee articulating spacer. 2.  Reimplantation right total knee arthroplasty. 3. Augmentation of arthrotomy repair (CPT [id number]). 4. Placement of negative pressure incisional dressing.   ANESTHESIA:   Regional.   Spinal. MAC.   ANTIBIOTICS:  2g Ancef. 1g Vancomycin.  EXPLANTS: Zimmer Persona CR femur. Zimmer CR poly liner.   IMPLANTS: Zimmer persona revision cemented femur size 11+ with 5 mm distal augment x2, and 24 x 135 mm splined stem. Zimmer persona revision tibia fixed bearing size G with 18 x 135 mm splined stem, 6 mm offset. Zimmer persona revision trabecular metal tibial central cone, size medium. Zimmer persona fixed-bearing Vivacit-E CPS poly liner, size 12 mm. Embody Tapestry BioIntegrative implant 50 x 40 x25 mm. Embody Tapestry BioIntegrative implant 70 x 50 mm. Juggerknot 2.9 mm suture anchor.  SPECIMENS:  1. Right knee femoral interface membrane for tissue culture. 2. Right knee tibial interface membrane for tissue culture.  COMPLICATIONS:  None.  DISPOSITION:  Stable to PACU.  SURGICAL INDICATIONS:  Bryan Carroll is a 72 y.o. male who underwent resection right total knee arthroplasty with placement of articulating antibiotic spacer for strep viridans PJI on 08/19/2022.  He completed his treatment course of antibiotics, and after an appropriate antibiotic holiday, repeat testing was done.  On 10/19/2022, his sed rate was normal at 2 mm/h, and C-reactive protein was improved however still mildly elevated at 13 mg/L.  Aspiration of the right knee was done on 10/18/2022, yielding synovial WBC to 206  with 86% neutrophils.  Alpha defensin negative.  Microbial ID panel negative.  Culture final negative.  He was indicated for resection of his spacer and revision right total knee arthroplasty.  The risks, benefits, and alternatives were discussed with the patient. There are risks associated with the surgery including, but not limited to, problems with anesthesia (death), infection, instability (giving out of the joint), dislocation, differences in leg length/angulation/rotation, fracture of bones, loosening or failure of implants, hematoma (blood accumulation) which may require surgical drainage, blood clots, pulmonary embolism, nerve injury (foot drop and lateral thigh numbness), and blood vessel injury. The patient understands these risks and elects to proceed.  PROCEDURE IN DETAIL: The patient was identified in the holding area using 2 identifiers.  Regional block was placed by anesthesia. He was taken to the operating room, placed supine on the operating room table.  Spinal anesthesia was obtained.  A Foley catheter was placed.  Nonsterile tourniquet was applied to the right thigh.  The right lower extremity was prepped and draped in the normal sterile surgical fashion.  Timeout was called, verifying side and site of surgery.  He did receive IV antibiotics within 60 minutes of beginning the procedure.  Esmarch exsanguination was utilized, and the tourniquet was elevated to 340 mmHg.  The previous anterior incision was sharply excised with a #10 blade. Full-thickness skin flaps were created.  Medial peripatellar arthrotomy was performed.  No purulence or evidence of infection.  Medial release was performed.  Medial and lateral gutters were reestablished.  The knee was brought into extension.  Infrapatellar scar was excised with Bovie electrocautery.  The knee was then brought into flexion.  The  femoral component of the spacer was removed without any difficulty.  The tibial component was then removed without  any difficulty.  Representative samples of interface membrane were sent for tissue culture. The cement intramedullary dowels were removed.  Posterior compartment capsulectomy was performed.  Sequential reaming was performed up to a size 18 mm reamer on the tibia, and a 24 mm reamer on the femur.  Intramedullary guide was used to freshen the proximal tibial cut.  There was a contained medial defect. The tibia was sized to a G, and 6 mm of offset was used to optimize coverage.  The proximal tibia was then prepped for a cone.  The trial tibial component was assembled on the back table and inserted.  I then turned my attention to the femur.  The femur was sized to a 11+.  The distal cutting guide was pinned in the place, while using a laminar spreader to appropriately tension the flexion gap.  The posterior condyles were intact.  I planned for distal augments x2.The box cut was then made.  The trial femoral component was assembled and inserted.  I placed a 12 mm liner.   The knee was brought into extension.  Scar tissue was removed from the patellar remnant with a curette. The knee was stable to varus and valgus stress throughout the range of motion.  The flexion and extension gaps were well-balanced.  The patella tracked centrally using no thumbs technique. The knee was then irrigated with normal saline using pulse lavage and Prontosan solution.  The real components were opened and assembled on the back table.  Small drill holes were made in the proximal tibia.  The cut bony surfaces were irrigated and dried.  The tibial cone was placed.  The tibial and femoral components were then inserted using a hybrid fixation technique.  Trial polyliner was placed, and excess cement was cleared.  The knee was brought into extension while the cement polymerized.  All excess cement was removed.  The real 12 mm CPS liner was inserted.  The tourniquet was let down. Meticulous hemostasis was achieved with the aqua mantis and  Bovie electrocautery.  The knee was irrigated again with Prontosan solution and normal saline using pulse lavage.  A medium Hemovac drain was placed within the lateral gutter.  1 gram vancomycin powder was placed in the knee. I reinforced the patellar tendon insertion with a single suture anchor. The arthrotomy was closed with #1 Vicryl and #1 strata fix suture. I augmented the fascial repair with Tapestry BioIntegrative implants. Deep dermal layer was closed with 2-0 Monocryl. Skin was reapproximated with staples.  Customizable Prevena dressing was placed and connected to suction at 75 mmHg without leak.  Bulky dressing was then placed with cast padding and an Ace wrap. A knee immobilizer was placed. Patient was awakened from anesthesia and taken to the PACU in stable condition.  Sponge, needle, and instrument counts were correct at the end of the case x2.  There were no known complications.  The aquamantis was utilized for this case to help facilitate better hemostasis as patient was felt to be at increased risk of bleeding because of complex case requiring increased OR time and/or exposure.   Please note that a surgical assistant was a medical necessity for this procedure in order to perform it in a safe and expeditious manner. Surgical assistant was necessary to retract the ligaments and vital neurovascular structures to prevent injury to them and also necessary for proper positioning of the  limb to allow for anatomic placement of the prosthesis.

## 2022-12-22 NOTE — Anesthesia Procedure Notes (Signed)
Anesthesia Regional Block: Adductor canal block   Pre-Anesthetic Checklist: , timeout performed,  Correct Patient, Correct Site, Correct Laterality,  Correct Procedure, Correct Position, site marked,  Risks and benefits discussed,  Surgical consent,  Pre-op evaluation,  At surgeon's request and post-op pain management  Laterality: Right  Prep: chloraprep       Needles:  Injection technique: Single-shot  Needle Type: Echogenic Needle     Needle Length: 10cm  Needle Gauge: 21     Additional Needles:   Narrative:  Start time: 12/22/2022 9:54 AM End time: 12/22/2022 9:57 AM Injection made incrementally with aspirations every 5 mL.  Performed by: Personally  Anesthesiologist: Beryle Lathe, MD  Additional Notes: No pain on injection. No increased resistance to injection. Injection made in 5cc increments. Good needle visualization. Patient tolerated the procedure well.

## 2022-12-22 NOTE — Anesthesia Procedure Notes (Signed)
Spinal  Patient location during procedure: OR End time: 12/22/2022 10:23 AM Reason for block: surgical anesthesia Staffing Performed: anesthesiologist  Anesthesiologist: Jairo Ben, MD Resident/CRNA: Jorene Minors, CRNA Performed by: Jairo Ben, MD Authorized by: Jairo Ben, MD   Preanesthetic Checklist Completed: patient identified, IV checked, site marked, risks and benefits discussed, surgical consent, monitors and equipment checked, pre-op evaluation and timeout performed Spinal Block Patient position: sitting Prep: DuraPrep Patient monitoring: heart rate, cardiac monitor, continuous pulse ox and blood pressure Approach: midline Location: L3-4 Injection technique: single-shot Needle Needle type: Pencan and Introducer  Needle gauge: 24 G Needle length: 9 cm Assessment Sensory level: T4 Events: CSF return Additional Notes Pt identified in Operating room.  Monitors applied. Working IV access confirmed. Sterile prep, drape lumbar spine.  1% lido local L 3,4.  #24ga Pencan into clear CSF L 3,4.  12mg  0.75% Bupivacaine with dextrose injected with asp CSF beginning and end of injection.  Patient asymptomatic, VSS, no heme aspirated, tolerated well.  Sandford Craze, MD

## 2022-12-22 NOTE — Transfer of Care (Signed)
Immediate Anesthesia Transfer of Care Note  Patient: Bryan Carroll  Procedure(s) Performed: REMOVAL OF  SPACER KNEE (Right: Knee) TOTAL KNEE REVISION (Right: Knee)  Patient Location: PACU  Anesthesia Type:Spinal and GA combined with regional for post-op pain  Level of Consciousness: awake, alert , oriented, and patient cooperative  Airway & Oxygen Therapy: Patient Spontanous Breathing and Patient connected to nasal cannula oxygen  Post-op Assessment: Report given to RN and Post -op Vital signs reviewed and stable  Post vital signs: Reviewed and stable  Last Vitals:  Vitals Value Taken Time  BP 104/54 12/22/22 1425  Temp    Pulse 70 12/22/22 1426  Resp 8 12/22/22 1426  SpO2 94 % 12/22/22 1426  Vitals shown include unvalidated device data.  Last Pain:  Vitals:   12/22/22 0954  TempSrc:   PainSc: 0-No pain         Complications: No notable events documented.

## 2022-12-22 NOTE — Plan of Care (Signed)
  Problem: Education: Goal: Knowledge of the prescribed therapeutic regimen will improve Outcome: Progressing   Problem: Activity: Goal: Ability to avoid complications of mobility impairment will improve Outcome: Progressing   Problem: Pain Management: Goal: Pain level will decrease with appropriate interventions Outcome: Progressing   

## 2022-12-23 LAB — BASIC METABOLIC PANEL
Anion gap: 6 (ref 5–15)
BUN: 10 mg/dL (ref 8–23)
CO2: 27 mmol/L (ref 22–32)
Calcium: 7.9 mg/dL — ABNORMAL LOW (ref 8.9–10.3)
Chloride: 98 mmol/L (ref 98–111)
Creatinine, Ser: 0.89 mg/dL (ref 0.61–1.24)
GFR, Estimated: >60 mL/min (ref >60)
Glucose, Bld: 138 mg/dL — ABNORMAL HIGH (ref 70–99)
Potassium: 4.3 mmol/L (ref 3.5–5.1)
Sodium: 131 mmol/L — ABNORMAL LOW (ref 135–145)

## 2022-12-23 LAB — CBC
HCT: 36.9 % — ABNORMAL LOW (ref 39.0–52.0)
Hemoglobin: 12 g/dL — ABNORMAL LOW (ref 13.0–17.0)
MCH: 27.5 pg (ref 26.0–34.0)
MCHC: 32.5 g/dL (ref 30.0–36.0)
MCV: 84.6 fL (ref 80.0–100.0)
Platelets: 172 10*3/uL (ref 150–400)
RBC: 4.36 MIL/uL (ref 4.22–5.81)
RDW: 15.4 % (ref 11.5–15.5)
WBC: 10.3 10*3/uL (ref 4.0–10.5)
nRBC: 0 % (ref 0.0–0.2)

## 2022-12-23 LAB — AEROBIC/ANAEROBIC CULTURE W GRAM STAIN (SURGICAL/DEEP WOUND): Gram Stain: NONE SEEN

## 2022-12-23 NOTE — Evaluation (Signed)
Physical Therapy Evaluation Patient Details Name: Bryan Carroll MRN: [id number] DOB: [birth year] Today's Date: 12/23/2022  History of Present Illness  Pt s/p R TKR reimplantation following antibiotic spacer.  Clinical Impression  Pt s/p R TKR reimplantation and presenting with functional mobility limitations 2* decreased R LE strength/ROM and post op pain.  Pt should progress to dc home with family assist and reports first OP PT scheduled for 12/29/22.     Recommendations for follow up therapy are one component of a multi-disciplinary discharge planning process, led by the attending physician.  Recommendations may be updated based on patient status, additional functional criteria and insurance authorization.  Follow Up Recommendations       Assistance Recommended at Discharge Intermittent Supervision/Assistance  Patient can return home with the following  A little help with walking and/or transfers;A little help with bathing/dressing/bathroom;Assistance with cooking/housework;Assist for transportation;Help with stairs or ramp for entrance    Equipment Recommendations None recommended by PT  Recommendations for Other Services       Functional Status Assessment Patient has had a recent decline in their functional status and demonstrates the ability to make significant improvements in function in a reasonable and predictable amount of time.     Precautions / Restrictions Precautions Precautions: Knee;Fall Required Braces or Orthoses: Knee Immobilizer - Right Knee Immobilizer - Right: On at all times Restrictions Weight Bearing Restrictions: No Other Position/Activity Restrictions: WBAT      Mobility  Bed Mobility Overal bed mobility: Needs Assistance Bed Mobility: Supine to Sit     Supine to sit: Min guard     General bed mobility comments: increased time with min guard for safety    Transfers Overall transfer level: Needs assistance Equipment used: Rolling  walker (2 wheels) Transfers: Sit to/from Stand Sit to Stand: Min assist           General transfer comment: cues for LE management and use of UEs to self assist    Ambulation/Gait Ambulation/Gait assistance: Min assist Gait Distance (Feet): 70 Feet Assistive device: Rolling walker (2 wheels) Gait Pattern/deviations: Step-to pattern, Decreased step length - right, Decreased step length - left, Shuffle, Trunk flexed Gait velocity: decr     General Gait Details: cues for posture, position from RW and sequence  Stairs            Wheelchair Mobility    Modified Rankin (Stroke Patients Only)       Balance Overall balance assessment: Needs assistance Sitting-balance support: Feet supported, No upper extremity supported Sitting balance-Leahy Scale: Good     Standing balance support: Bilateral upper extremity supported Standing balance-Leahy Scale: Poor                               Pertinent Vitals/Pain Pain Assessment Pain Assessment: 0-10 Pain Score: 6  Pain Location: R knee Pain Descriptors / Indicators: Aching, Sore Pain Intervention(s): Limited activity within patient's tolerance, Monitored during session, Premedicated before session, Ice applied    Home Living Family/patient expects to be discharged to:: Private residence Living Arrangements: Spouse/significant other Available Help at Discharge: Family;Available 24 hours/day Type of Home: House Home Access: Stairs to enter Entrance Stairs-Rails: None Entrance Stairs-Number of Steps: 3   Home Layout: Multi-level;Able to live on main level with bedroom/bathroom Home Equipment: Gilmer Mor - single point;Shower seat - built Charity fundraiser (2 wheels) Additional Comments: Will have assist from multiple family members    Prior Function Prior Level of Function :  Independent/Modified Independent             Mobility Comments: Pt reports walking with cane with spacer in place       Hand  Dominance        Extremity/Trunk Assessment   Upper Extremity Assessment Upper Extremity Assessment: Overall WFL for tasks assessed    Lower Extremity Assessment Lower Extremity Assessment: RLE deficits/detail RLE Deficits / Details: 2+/5 quads with assist to perform SLR    Cervical / Trunk Assessment Cervical / Trunk Assessment: Normal  Communication   Communication: No difficulties  Cognition Arousal/Alertness: Awake/alert Behavior During Therapy: WFL for tasks assessed/performed Overall Cognitive Status: Within Functional Limits for tasks assessed                                          General Comments      Exercises Total Joint Exercises Ankle Circles/Pumps: AROM, Both, 15 reps, Supine Quad Sets: AROM, Both, 10 reps, Supine Heel Slides: AAROM, 10 reps, Supine, Right   Assessment/Plan    PT Assessment Patient needs continued PT services  PT Problem List Decreased strength;Decreased range of motion;Decreased activity tolerance;Decreased balance;Decreased mobility;Decreased knowledge of use of DME;Pain       PT Treatment Interventions DME instruction;Gait training;Stair training;Functional mobility training;Therapeutic activities;Therapeutic exercise;Patient/family education    PT Goals (Current goals can be found in the Care Plan section)  Acute Rehab PT Goals Patient Stated Goal: Regain IND PT Goal Formulation: With patient Time For Goal Achievement: 12/30/22 Potential to Achieve Goals: Good    Frequency 7X/week     Co-evaluation               AM-PAC PT "6 Clicks" Mobility  Outcome Measure Help needed turning from your back to your side while in a flat bed without using bedrails?: A Little Help needed moving from lying on your back to sitting on the side of a flat bed without using bedrails?: A Little Help needed moving to and from a bed to a chair (including a wheelchair)?: A Little Help needed standing up from a chair using  your arms (e.g., wheelchair or bedside chair)?: A Little Help needed to walk in hospital room?: A Little Help needed climbing 3-5 steps with a railing? : A Lot 6 Click Score: 17    End of Session Equipment Utilized During Treatment: Gait belt;Right knee immobilizer Activity Tolerance: Patient tolerated treatment well Patient left: in chair;with call bell/phone within reach;with family/visitor present Nurse Communication: Mobility status PT Visit Diagnosis: Unsteadiness on feet (R26.81);Difficulty in walking, not elsewhere classified (R26.2);Pain Pain - Right/Left: Right Pain - part of body: Knee    Time: 0950-1030 PT Time Calculation (min) (ACUTE ONLY): 40 min   Charges:   PT Evaluation $PT Eval Low Complexity: 1 Low PT Treatments $Gait Training: 8-22 mins $Therapeutic Exercise: 8-22 mins        Mauro Kaufmann PT Acute Rehabilitation Services Pager [phone] Office [phone]   [facility] 12/23/2022, 12:43 PM

## 2022-12-23 NOTE — Progress Notes (Signed)
Subjective:  Patient reports pain as mild to moderate.  Denies N/V/CP/SOB/Abd pain. He reports mostly upper thigh pain in area of tourniquet, currently has heat packs. No reported pain over the knee. We discussed to use the Knee Immobilizer to protect soft tissues and extensor mechanism. Continue to ice knee. Denies any tingling or numbness in LE bilaterally.   We discussed he will remain in the hospital until drain is removed. All questions solicited and answered.    Objective:   VITALS:   Vitals:   12/22/22 1809 12/22/22 2222 12/23/22 0123 12/23/22 0543  BP: 138/69 114/72 129/70 124/61  Pulse: 65 70 73 70  Resp: 16 17 17 17   Temp: (!) 96.8 F (36 C) 98.2 F (36.8 C) 98.6 F (37 C) 98.4 F (36.9 C)  TempSrc:  Oral Oral Oral  SpO2: 98% 97% 97% 100%  Weight:      Height:        Patient sitting up in bed. NAD.  Neurologically intact ABD soft Neurovascular intact Sensation intact distally Intact pulses distally Dorsiflexion/Plantar flexion intact No cellulitis present Compartment soft Prevena negative pressure dressing C/D/I. House vac on 75 mmHg, no leaks detected.  Hemovac Drain dressing C/DI. 175 cc bloody output. Continue drain.   Lab Results  Component Value Date   WBC 10.3 12/23/2022   HGB 12.0 (L) 12/23/2022   HCT 36.9 (L) 12/23/2022   MCV 84.6 12/23/2022   PLT 172 12/23/2022   BMET    Component Value Date/Time   NA 131 (L) 12/23/2022 0333   K 4.3 12/23/2022 0333   CL 98 12/23/2022 0333   CO2 27 12/23/2022 0333   GLUCOSE 138 (H) 12/23/2022 0333   BUN 10 12/23/2022 0333   CREATININE 0.89 12/23/2022 0333   CALCIUM 7.9 (L) 12/23/2022 0333   GFRNONAA >60 12/23/2022 4098   Results for orders placed or performed during the hospital encounter of 12/22/22  Aerobic/Anaerobic Culture w Gram Stain (surgical/deep wound)     Status: None (Preliminary result)   Collection Time: 12/22/22 11:29 AM   Specimen: Soft Tissue, Other  Result Value Ref Range Status    Specimen Description   Final    TISSUE Performed at [facility], [address] [place], [place] [address]    Special Requests  RIGHT FEMUR  Final   Gram Stain   Final    NO WBC SEEN NO ORGANISMS SEEN Performed at [facility], [address] [place], [place] [address]    Culture PENDING  Incomplete   Report Status PENDING  Incomplete  Aerobic/Anaerobic Culture w Gram Stain (surgical/deep wound)     Status: None (Preliminary result)   Collection Time: 12/22/22 11:39 AM   Specimen: Soft Tissue, Other  Result Value Ref Range Status   Specimen Description   Final    TISSUE Performed at [facility], [address], [place], [place] [address]    Special Requests  RIGHTR TIBIA  Final   Gram Stain   Final    NO WBC SEEN NO ORGANISMS SEEN Performed at [facility], [address], [place], [place] [address]    Culture PENDING  Incomplete   Report Status PENDING  Incomplete     Assessment/Plan: 1 Day Post-Op   Principal Problem:   Infection of total right knee replacement, subsequent encounter    WBAT with walker and Knee Immobilizer.  DVT ppx:  Eliquis , SCDs, TEDS PO pain control PT/OT: PT has  not seen yet. PT to come by today.  Dispo:  - Patient to mobilize with PT today.  - Continue house vac suction unit at and monitor drain output. At time of discharge nurse to convert house vac to prevena portable suction unit.  - Follow intraoperative culture.  - We will discontinue the Hemovac drain when output is less than 30 cc per shift.    Clois Dupes, PA-C 12/23/2022, 7:45 AM   [facility]  [place] [address], [address], [place], [place] [address] Phone: [phone] [email or web address] Facebook  [employer]

## 2022-12-23 NOTE — Progress Notes (Signed)
Physical Therapy Treatment Patient Details Name: Bryan Carroll MRN: [id number] DOB: Dec 22, 1950 Today's Date: 12/23/2022   History of Present Illness Pt s/p R TKR reimplantation following antibiotic spacer.    PT Comments    Pt continues very motivated and progressing steadily with mobility including increased distance ambulated in hall and transfers with min guard assist for safety only.  Pt eager for dc home.   Recommendations for follow up therapy are one component of a multi-disciplinary discharge planning process, led by the attending physician.  Recommendations may be updated based on patient status, additional functional criteria and insurance authorization.  Follow Up Recommendations       Assistance Recommended at Discharge Intermittent Supervision/Assistance  Patient can return home with the following A little help with walking and/or transfers;A little help with bathing/dressing/bathroom;Assistance with cooking/housework;Assist for transportation;Help with stairs or ramp for entrance   Equipment Recommendations  None recommended by PT    Recommendations for Other Services       Precautions / Restrictions Precautions Precautions: Knee;Fall Required Braces or Orthoses: Knee Immobilizer - Right Knee Immobilizer - Right: On at all times Restrictions Weight Bearing Restrictions: No Other Position/Activity Restrictions: WBAT     Mobility  Bed Mobility Overal bed mobility: Needs Assistance Bed Mobility: Supine to Sit, Sit to Supine     Supine to sit: Min guard Sit to supine: Min guard   General bed mobility comments: increased time with min guard for safety    Transfers Overall transfer level: Needs assistance Equipment used: Rolling walker (2 wheels) Transfers: Sit to/from Stand Sit to Stand: Min assist, Min guard           General transfer comment: cues for LE management and use of UEs to self assist    Ambulation/Gait Ambulation/Gait  assistance: Min guard Gait Distance (Feet): 120 Feet Assistive device: Rolling walker (2 wheels) Gait Pattern/deviations: Step-to pattern, Decreased step length - right, Decreased step length - left, Shuffle, Trunk flexed Gait velocity: decr     General Gait Details: min cues for posture, position from RW and sequence   Stairs             Wheelchair Mobility    Modified Rankin (Stroke Patients Only)       Balance Overall balance assessment: Needs assistance Sitting-balance support: Feet supported, No upper extremity supported Sitting balance-Leahy Scale: Good     Standing balance support: Single extremity supported Standing balance-Leahy Scale: Poor                              Cognition Arousal/Alertness: Awake/alert Behavior During Therapy: WFL for tasks assessed/performed Overall Cognitive Status: Within Functional Limits for tasks assessed                                          Exercises Total Joint Exercises Ankle Circles/Pumps: AROM, Both, 15 reps, Supine Quad Sets: AROM, Both, 10 reps, Supine Heel Slides: AAROM, 10 reps, Supine, Right    General Comments        Pertinent Vitals/Pain Pain Assessment Pain Assessment: 0-10 Pain Score: 6  Pain Location: R knee Pain Descriptors / Indicators: Aching, Sore Pain Intervention(s): Limited activity within patient's tolerance, Monitored during session, Premedicated before session, Ice applied    Home Living Family/patient expects to be discharged to:: Private residence Living Arrangements: Spouse/significant other Available Help  at Discharge: Family;Available 24 hours/day Type of Home: House Home Access: Stairs to enter Entrance Stairs-Rails: None Entrance Stairs-Number of Steps: 3   Home Layout: Multi-level;Able to live on main level with bedroom/bathroom Home Equipment: Gilmer Mor - single point;Shower seat - built [employer] fundraiser (2 wheels) Additional Comments: Will have  assist from multiple family members    Prior Function            PT Goals (current goals can now be found in the care plan section) Acute Rehab PT Goals Patient Stated Goal: Regain IND PT Goal Formulation: With patient Time For Goal Achievement: 12/30/22 Potential to Achieve Goals: Good Progress towards PT goals: Progressing toward goals    Frequency    7X/week      PT Plan Current plan remains appropriate    Co-evaluation              AM-PAC PT "6 Clicks" Mobility   Outcome Measure  Help needed turning from your back to your side while in a flat bed without using bedrails?: A Little Help needed moving from lying on your back to sitting on the side of a flat bed without using bedrails?: A Little Help needed moving to and from a bed to a chair (including a wheelchair)?: A Little Help needed standing up from a chair using your arms (e.g., wheelchair or bedside chair)?: A Little Help needed to walk in hospital room?: A Little Help needed climbing 3-5 steps with a railing? : A Lot 6 Click Score: 17    End of Session Equipment Utilized During Treatment: Gait belt;Right knee immobilizer Activity Tolerance: Patient tolerated treatment well Patient left: in bed;with call bell/phone within reach;with family/visitor present Nurse Communication: Mobility status PT Visit Diagnosis: Unsteadiness on feet (R26.81);Difficulty in walking, not elsewhere classified (R26.2);Pain Pain - Right/Left: Right Pain - part of body: Knee     Time: 1610-9604 PT Time Calculation (min) (ACUTE ONLY): 24 min  Charges:  $Gait Training: 8-22 mins $Therapeutic Exercise: 8-22 mins $Therapeutic Activity: 8-22 mins                     Mauro Kaufmann PT Acute Rehabilitation Services Pager [phone] Office [phone]    [facility] 12/23/2022, 2:49 PM

## 2022-12-23 NOTE — TOC Transition Note (Signed)
Transition of Care [facility]) - CM/SW Discharge Note  Patient Details  Name: Bryan Carroll MRN: [id number] Date of Birth: 1951-04-19  [facility] of [facility]) CM/SW Contact:  Ewing Schlein, LCSW Phone Number: 12/23/2022, 2:50 PM  Clinical Narrative: Patient is expected to discharge home after working with PT. CSW met with patient to confirm discharge plan. Patient will go home with OPPT at [facility]. Patient has a rolling walker at home, so there are no DME needs at this time. TOC signing off.    Final next level of care: OP Rehab Barriers to Discharge: No Barriers Identified  Patient Goals and CMS Choice Choice offered to / list presented to : NA  Discharge Plan and Services Additional resources added to the After Visit Summary for        DME Arranged: N/A DME Agency: NA  Social Determinants of Health (SDOH) Interventions SDOH Screenings   Food Insecurity: No Food Insecurity (08/19/2022)  Housing: Patient Declined (08/19/2022)  Transportation Needs: No Transportation Needs (08/19/2022)  Utilities: Not At Risk (08/19/2022)  Depression (PHQ2-9): Low Risk  (10/20/2022)  Tobacco Use: Low Risk  (12/22/2022)   Readmission Risk Interventions     No data to display

## 2022-12-24 ENCOUNTER — Encounter (HOSPITAL_COMMUNITY): Payer: Self-pay | Admitting: Orthopedic Surgery

## 2022-12-24 LAB — CBC
HCT: 35.1 % — ABNORMAL LOW (ref 39.0–52.0)
Hemoglobin: 11 g/dL — ABNORMAL LOW (ref 13.0–17.0)
MCH: 26.8 pg (ref 26.0–34.0)
MCHC: 31.3 g/dL (ref 30.0–36.0)
MCV: 85.6 fL (ref 80.0–100.0)
Platelets: 142 10*3/uL — ABNORMAL LOW (ref 150–400)
RBC: 4.1 MIL/uL — ABNORMAL LOW (ref 4.22–5.81)
RDW: 15.8 % — ABNORMAL HIGH (ref 11.5–15.5)
WBC: 10.7 10*3/uL — ABNORMAL HIGH (ref 4.0–10.5)
nRBC: 0 % (ref 0.0–0.2)

## 2022-12-24 MED ORDER — OXYCODONE HCL 5 MG PO TABS: 5.0000 mg | ORAL_TABLET | ORAL | 0 refills | Status: DC | PRN

## 2022-12-24 MED ORDER — SENNA 8.6 MG PO TABS: 2.0000 | ORAL_TABLET | Freq: Every day | ORAL | 0 refills | Status: AC

## 2022-12-24 MED ORDER — ORAL CARE MOUTH RINSE: 15.0000 mL | OROMUCOSAL | Status: DC | PRN

## 2022-12-24 MED ORDER — ONDANSETRON HCL 4 MG PO TABS: 4.0000 mg | ORAL_TABLET | Freq: Three times a day (TID) | ORAL | 0 refills | Status: DC | PRN

## 2022-12-24 MED ORDER — DOCUSATE SODIUM 100 MG PO CAPS: 100.0000 mg | ORAL_CAPSULE | Freq: Two times a day (BID) | ORAL | 0 refills | Status: AC

## 2022-12-24 MED ORDER — POLYETHYLENE GLYCOL 3350 17 G PO PACK: 17.0000 g | PACK | Freq: Every day | ORAL | 0 refills | Status: AC | PRN

## 2022-12-24 NOTE — Progress Notes (Signed)
Patient discharged to home w/ family. Prevena applied, education complete. Patient and family verbalized understanding of all instructions. Escorted to pov via w/c.

## 2022-12-24 NOTE — Progress Notes (Signed)
Subjective:  Patient reports pain as mild in the knee and moderate in the thigh where tourniquet placement was. Denies N/V/CP/SOB. He denies any tingling or numbness in LE bilaterally.   Objective:   VITALS:   Vitals:   12/23/22 1051 12/23/22 1647 12/23/22 2214 12/24/22 0609  BP: 123/71 112/79 136/61 107/82  Pulse: 78 93 100   Resp: 18 18 16 15   Temp: 98.7 F (37.1 C) 99.6 F (37.6 C) 98.8 F (37.1 C) 99.6 F (37.6 C)  TempSrc: Oral Oral Oral   SpO2: 96% 98% 96% 95%  Weight:      Height:        Patient lying in bed. NAD.  Neurologically intact ABD soft Neurovascular intact Sensation intact distally Intact pulses distally Dorsiflexion/Plantar flexion intact No cellulitis present Compartment soft Prevena negative pressure dressing C/D/I. House vac on 75 mmHg, no leaks detected.  Hemovac Drain dressing C/DI. Hemovac drain removed this morning. Dry dressing placed. Reinforce dressing as needed for drainage.   Lab Results  Component Value Date   WBC 10.7 (H) 12/24/2022   HGB 11.0 (L) 12/24/2022   HCT 35.1 (L) 12/24/2022   MCV 85.6 12/24/2022   PLT 142 (L) 12/24/2022   BMET    Component Value Date/Time   NA 131 (L) 12/23/2022 0333   K 4.3 12/23/2022 0333   CL 98 12/23/2022 0333   CO2 27 12/23/2022 0333   GLUCOSE 138 (H) 12/23/2022 0333   BUN 10 12/23/2022 0333   CREATININE 0.89 12/23/2022 0333   CALCIUM 7.9 (L) 12/23/2022 0333   GFRNONAA >60 12/23/2022 6010   Results for orders placed or performed during the hospital encounter of 12/22/22  Aerobic/Anaerobic Culture w Gram Stain (surgical/deep wound)     Status: None (Preliminary result)   Collection Time: 12/22/22 11:29 AM   Specimen: Soft Tissue, Other  Result Value Ref Range Status   Specimen Description   Final    TISSUE Performed at [facility], [address], [place], [place] [address]    Special Requests  RIGHT FEMUR  Final   Gram Stain NO WBC SEEN NO ORGANISMS SEEN    Final   Culture   Final    NO GROWTH < 24 HOURS Performed at [facility], [address] [place], [place] [address]    Report Status PENDING  Incomplete  Aerobic/Anaerobic Culture w Gram Stain (surgical/deep wound)     Status: None (Preliminary result)   Collection Time: 12/22/22 11:39 AM   Specimen: Soft Tissue, Other  Result Value Ref Range Status   Specimen Description   Final    TISSUE Performed at [facility], [address] [place], [place] [address]    Special Requests  RIGHTR TIBIA  Final   Gram Stain NO WBC SEEN NO ORGANISMS SEEN   Final   Culture   Final    NO GROWTH < 24 HOURS Performed at [facility], [address], [place], [place] [address]    Report Status PENDING  Incomplete     Assessment/Plan: 2 Days Post-Op   Principal Problem:   Infection of total right knee replacement, subsequent encounter   WBAT with walker with Knee immobilizer.  DVT ppx:  Eliquis , SCDs, TEDS PO pain control PT/OT: Patient ambulated well with PT yesterday 70 and 120 feed Dispo:  - Discharge today once cleared with PT.  - Continue house vac suction unit at and monitor drain output. At time  of discharge nurse to convert house vac to prevena portable suction unit.  - Follow intraoperative culture.  - Discontinued Hemovac drain this morning. New dressing placed. Reinforce dressing as needed for drainage.    Clois Dupes, PA-C 12/24/2022, 10:09 AM   [facility]  [place] [address], [address], [place], [place] [address] Phone: [phone] [email or web address] Facebook  [employer]

## 2022-12-24 NOTE — Progress Notes (Signed)
Physical Therapy Treatment Patient Details Name: Bryan Carroll MRN: [id number] DOB: [birth year] Today's Date: 12/24/2022   History of Present Illness Pt s/p R TKR reimplantation following antibiotic spacer.    PT Comments    Pt continues motivated and progressing well with mobility.  Pt performed limited (no flex at knee) HEP with KI in place, ambulated increased distance in hall and negotiated stairs. Pt eager for dc home this date.   Recommendations for follow up therapy are one component of a multi-disciplinary discharge planning process, led by the attending physician.  Recommendations may be updated based on patient status, additional functional criteria and insurance authorization.  Follow Up Recommendations       Assistance Recommended at Discharge Intermittent Supervision/Assistance  Patient can return home with the following A little help with walking and/or transfers;A little help with bathing/dressing/bathroom;Assistance with cooking/housework;Assist for transportation;Help with stairs or ramp for entrance   Equipment Recommendations  None recommended by PT    Recommendations for Other Services       Precautions / Restrictions Precautions Precautions: Knee;Fall Required Braces or Orthoses: Knee Immobilizer - Right Knee Immobilizer - Right: On at all times Restrictions Weight Bearing Restrictions: No Other Position/Activity Restrictions: WBAT     Mobility  Bed Mobility Overal bed mobility: Needs Assistance Bed Mobility: Supine to Sit, Sit to Supine     Supine to sit: Supervision Sit to supine: Supervision   General bed mobility comments: increased time with min guard for safety    Transfers Overall transfer level: Needs assistance Equipment used: Rolling walker (2 wheels) Transfers: Sit to/from Stand Sit to Stand: Min guard, Supervision           General transfer comment: cues for LE management and use of UEs to self assist     Ambulation/Gait Ambulation/Gait assistance: Min guard Gait Distance (Feet): 180 Feet Assistive device: Rolling walker (2 wheels) Gait Pattern/deviations: Decreased step length - right, Decreased step length - left, Shuffle, Trunk flexed, Step-to pattern, Step-through pattern Gait velocity: decr     General Gait Details: min cues for posture, position from RW and sequence   Stairs Stairs: Yes Stairs assistance: Min assist Stair Management: No rails, Step to pattern, Backwards, With walker Number of Stairs: 4 General stair comments: 2 steps twice with cues for sequence and min assist to stability and RW management   Wheelchair Mobility    Modified Rankin (Stroke Patients Only)       Balance Overall balance assessment: Needs assistance Sitting-balance support: Feet supported, No upper extremity supported Sitting balance-Leahy Scale: Good     Standing balance support: No upper extremity supported Standing balance-Leahy Scale: Fair                              Cognition Arousal/Alertness: Awake/alert Behavior During Therapy: WFL for tasks assessed/performed Overall Cognitive Status: Within Functional Limits for tasks assessed                                          Exercises Total Joint Exercises Ankle Circles/Pumps: AROM, Both, 15 reps, Supine Quad Sets: AROM, Both, 10 reps, Supine Hip ABduction/ADduction: AAROM, Right, 15 reps, Supine Straight Leg Raises: AAROM, Right, 10 reps, Supine    General Comments        Pertinent Vitals/Pain Pain Assessment Pain Assessment: 0-10 Pain Score: 6  Pain Location:  R knee Pain Descriptors / Indicators: Aching, Sore Pain Intervention(s): Limited activity within patient's tolerance, Monitored during session, Premedicated before session, Ice applied    Home Living                          Prior Function            PT Goals (current goals can now be found in the care plan  section) Acute Rehab PT Goals Patient Stated Goal: Regain IND PT Goal Formulation: With patient Time For Goal Achievement: 12/30/22 Potential to Achieve Goals: Good Progress towards PT goals: Progressing toward goals    Frequency    7X/week      PT Plan Current plan remains appropriate    Co-evaluation              AM-PAC PT "6 Clicks" Mobility   Outcome Measure  Help needed turning from your back to your side while in a flat bed without using bedrails?: A Little Help needed moving from lying on your back to sitting on the side of a flat bed without using bedrails?: A Little Help needed moving to and from a bed to a chair (including a wheelchair)?: A Little Help needed standing up from a chair using your arms (e.g., wheelchair or bedside chair)?: A Little Help needed to walk in hospital room?: A Little Help needed climbing 3-5 steps with a railing? : A Little 6 Click Score: 18    End of Session Equipment Utilized During Treatment: Gait belt;Right knee immobilizer Activity Tolerance: Patient tolerated treatment well Patient left: in bed;with call bell/phone within reach;with family/visitor present Nurse Communication: Mobility status PT Visit Diagnosis: Unsteadiness on feet (R26.81);Difficulty in walking, not elsewhere classified (R26.2);Pain Pain - Right/Left: Right Pain - part of body: Knee     Time: 1031-1110 PT Time Calculation (min) (ACUTE ONLY): 39 min  Charges:  $Gait Training: 8-22 mins $Therapeutic Exercise: 8-22 mins $Therapeutic Activity: 8-22 mins                     Mauro Kaufmann PT Acute Rehabilitation Services Pager [phone] Office [phone]    Bryan Carroll 12/24/2022, 12:40 PM

## 2022-12-25 LAB — AEROBIC/ANAEROBIC CULTURE W GRAM STAIN (SURGICAL/DEEP WOUND): Gram Stain: NONE SEEN

## 2022-12-26 LAB — AEROBIC/ANAEROBIC CULTURE W GRAM STAIN (SURGICAL/DEEP WOUND)

## 2022-12-27 LAB — AEROBIC/ANAEROBIC CULTURE W GRAM STAIN (SURGICAL/DEEP WOUND): Culture: NO GROWTH

## 2022-12-27 NOTE — Discharge Summary (Signed)
Physician Discharge Summary  Patient ID: Bryan Carroll MRN: [id number] DOB/AGE: 03/05/1951 72 y.o.  Admit date: 12/22/2022 Discharge date: 12/24/2022  Admission Diagnoses:  Infection of total right knee replacement, subsequent encounter  Discharge Diagnoses:  Principal Problem:   Infection of total right knee replacement, subsequent encounter   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   Flexion contracture of knee, right    post op total knee 05-26-2017   GERD (gastroesophageal reflux disease)    History of colon polyps    History of DVT of lower extremity 06/13/2017   ED visit --  dvt right lower leg post op right total knee   Hypertension    Palpitations    per cardiologist note, dr Dietrich Pates, 2015--  24hr monitor show SB to ST w/  rare PVCs and rare PACs    Surgeries: Procedure(s): REMOVAL OF  SPACER KNEE TOTAL KNEE REVISION on 12/22/2022   Consultants (if any):   Discharged Condition: Improved  Hospital Course: Bryan Carroll is an 72 y.o. male who was admitted 12/22/2022 with a diagnosis of Infection of total right knee replacement, subsequent encounter and went to the operating room on 12/22/2022 and underwent the above named procedures.    He was given perioperative antibiotics:  Anti-infectives (From admission, onward)    Start     Dose/Rate Route Frequency Ordered Stop   12/22/22 1600  ceFAZolin (ANCEF) IVPB 2g/100 mL premix        2 g 200 mL/hr over 30 Minutes Intravenous Every 6 hours 12/22/22 1556 12/22/22 2231   12/22/22 0830  ceFAZolin (ANCEF) IVPB 2g/100 mL premix        2 g 200 mL/hr over 30 Minutes Intravenous On call to O.R. 12/22/22 0822 12/22/22 1044   12/22/22 0830  vancomycin (VANCOCIN) IVPB 1000 mg/200 mL premix        1,000 mg 200 mL/hr over 60 Minutes Intravenous  Once 12/22/22 0822 12/22/22 1003   12/22/22 0000  cephALEXin (KEFLEX) 500 MG capsule        500  mg Oral 2 times daily 12/22/22 1457 01/21/23 2359       He was given sequential compression devices, early ambulation, and Eliquis for DVT prophylaxis.  POD#1 Hemovac dressing C/D/I, 175 cc bloody output. Prevena negative pressure dressing no leaks detected no drainage. Ambulated 70 and 120 feet with PT with KI.   POD#2 Hemovac drain removed and dry dressing placed over insertion site. Prevena negative pressure dressing no leaks detected no drainage. Ambulated 180 feet with PT with KI. D/c home. Patient to follow-up within 1 week of discharge for removal of prevena negative pressure dressing.   He benefited maximally from the hospital stay and there were no complications.    Recent vital signs:  Vitals:   12/23/22 2214 12/24/22 0609  BP: 136/61 107/82  Pulse: 100   Resp: 16 15  Temp: 98.8 F (37.1 C) 99.6 F (37.6 C)  SpO2: 96% 95%    Recent laboratory studies:  Lab Results  Component Value Date   HGB 11.0 (L) 12/24/2022   HGB 12.0 (L) 12/23/2022   HGB 15.7 12/13/2022   Lab Results  Component Value Date   WBC 10.7 (H) 12/24/2022   PLT 142 (L) 12/24/2022   Lab Results  Component Value Date   INR 0.91 05/20/2017   Lab Results  Component Value Date   NA  131 (L) 12/23/2022   K 4.3 12/23/2022   CL 98 12/23/2022   CO2 27 12/23/2022   BUN 10 12/23/2022   CREATININE 0.89 12/23/2022   GLUCOSE 138 (H) 12/23/2022     Allergies as of 12/24/2022   No Known Allergies      Medication List     TAKE these medications    acetaminophen 325 MG tablet Commonly known as: TYLENOL Take 1-2 tablets (325-650 mg total) by mouth every 6 (six) hours as needed for mild pain (pain score 1-3 or temp > 100.5).   apixaban 2.5 MG Tabs tablet Commonly known as: Eliquis Take 1 tablet (2.5 mg total) by mouth 2 (two) times daily.   cephALEXin 500 MG capsule Commonly known as: KEFLEX Take 1 capsule (500 mg total) by mouth 2 (two) times daily.   docusate sodium 100 MG  capsule Commonly known as: Colace Take 1 capsule (100 mg total) by mouth 2 (two) times daily.   lisinopril 20 MG tablet Commonly known as: ZESTRIL Take 20 mg by mouth every morning.   MULTIVITAL PO Take 1 tablet by mouth daily.   ondansetron 4 MG tablet Commonly known as: Zofran Take 1 tablet (4 mg total) by mouth every 8 (eight) hours as needed for nausea or vomiting.   oxyCODONE 5 MG immediate release tablet Commonly known as: Roxicodone Take 1 tablet (5 mg total) by mouth every 4 (four) hours as needed for severe pain. What changed: reasons to take this   polyethylene glycol 17 g packet Commonly known as: MiraLax Take 17 g by mouth daily as needed for mild constipation or moderate constipation.   senna 8.6 MG Tabs tablet Commonly known as: SENOKOT Take 2 tablets (17.2 mg total) by mouth at bedtime for 15 days.               Discharge Care Instructions  (From admission, onward)           Start     Ordered   12/24/22 0000  Weight bearing as tolerated       Comments: With Knee immobilizer   12/24/22 1020   12/24/22 0000  Change dressing       Comments: Do not remove your dressing.   12/24/22 1020              WEIGHT BEARING   Weight bearing as tolerated with assist device (walker, cane, etc) as directed, use it as long as suggested by your surgeon or therapist, typically at least 4-6 weeks.   EXERCISES  Results after joint replacement surgery are often greatly improved when you follow the exercise, range of motion and muscle strengthening exercises prescribed by your doctor. Safety measures are also important to protect the joint from further injury. Any time any of these exercises cause you to have increased pain or swelling, decrease what you are doing until you are comfortable again and then slowly increase them. If you have problems or questions, call your caregiver or physical therapist for advice.   Rehabilitation is important following a joint  replacement. After just a few days of immobilization, the muscles of the leg can become weakened and shrink (atrophy).  These exercises are designed to build up the tone and strength of the thigh and leg muscles and to improve motion. Often times heat used for twenty to thirty minutes before working out will loosen up your tissues and help with improving the range of motion but do not use heat for the first two  weeks following surgery (sometimes heat can increase post-operative swelling).   These exercises can be done on a training (exercise) mat, on the floor, on a table or on a bed. Use whatever works the best and is most comfortable for you.    Use music or television while you are exercising so that the exercises are a pleasant break in your day. This will make your life better with the exercises acting as a break in your routine that you can look forward to.   Perform all exercises about fifteen times, three times per day or as directed.  You should exercise both the operative leg and the other leg as well.  Exercises include:   Quad Sets - Tighten up the muscle on the front of the thigh (Quad) and hold for 5-10 seconds.   Straight Leg Raises - With your knee straight (if you were given a brace, keep it on), lift the leg to 60 degrees, hold for 3 seconds, and slowly lower the leg.  Perform this exercise against resistance later as your leg gets stronger.  Leg Slides: Lying on your back, slowly slide your foot toward your buttocks, bending your knee up off the floor (only go as far as is comfortable). Then slowly slide your foot back down until your leg is flat on the floor again.  Angel Wings: Lying on your back spread your legs to the side as far apart as you can without causing discomfort.  Hamstring Strength:  Lying on your back, push your heel against the floor with your leg straight by tightening up the muscles of your buttocks.  Repeat, but this time bend your knee to a comfortable angle, and  push your heel against the floor.  You may put a pillow under the heel to make it more comfortable if necessary.   A rehabilitation program following joint replacement surgery can speed recovery and prevent re-injury in the future due to weakened muscles. Contact your doctor or a physical therapist for more information on knee rehabilitation.    CONSTIPATION  Constipation is defined medically as fewer than three stools per week and severe constipation as less than one stool per week.  Even if you have a regular bowel pattern at home, your normal regimen is likely to be disrupted due to multiple reasons following surgery.  Combination of anesthesia, postoperative narcotics, change in appetite and fluid intake all can affect your bowels.   YOU MUST use at least one of the following options; they are listed in order of increasing strength to get the job done.  They are all available over the counter, and you may need to use some, POSSIBLY even all of these options:    Drink plenty of fluids (prune juice may be helpful) and high fiber foods Colace 100 mg by mouth twice a day  Senokot for constipation as directed and as needed Dulcolax (bisacodyl), take with full glass of water  Miralax (polyethylene glycol) once or twice a day as needed.  If you have tried all these things and are unable to have a bowel movement in the first 3-4 days after surgery call either your surgeon or your primary doctor.    If you experience loose stools or diarrhea, hold the medications until you stool forms back up.  If your symptoms do not get better within 1 week or if they get worse, check with your doctor.  If you experience "the worst abdominal pain ever" or develop nausea or vomiting, please contact  the office immediately for further recommendations for treatment.   ITCHING:  If you experience itching with your medications, try taking only a single pain pill, or even half a pain pill at a time.  You can also use  Benadryl over the counter for itching or also to help with sleep.   TED HOSE STOCKINGS:  Use stockings on both legs until for at least 2 weeks or as directed by physician office. They may be removed at night for sleeping.  MEDICATIONS:  See your medication summary on the "After Visit Summary" that nursing will review with you.  You may have some home medications which will be placed on hold until you complete the course of blood thinner medication.  It is important for you to complete the blood thinner medication as prescribed.  PRECAUTIONS:  If you experience chest pain or shortness of breath - call 911 immediately for transfer to the hospital emergency department.   If you develop a fever greater that 101 F, purulent drainage from wound, increased redness or drainage from wound, foul odor from the wound/dressing, or calf pain - CONTACT YOUR SURGEON.                                                   FOLLOW-UP APPOINTMENTS:  If you do not already have a post-op appointment, please call the office for an appointment to be seen by your surgeon.  Guidelines for how soon to be seen are listed in your "After Visit Summary", but are typically between 1-4 weeks after surgery.  OTHER INSTRUCTIONS:   Knee Replacement:  Do not place pillow under knee, focus on keeping the knee straight while resting. CPM instructions: 0-90 degrees, 2 hours in the morning, 2 hours in the afternoon, and 2 hours in the evening. Place foam block, curve side up under heel at all times except when in CPM or when walking.  DO NOT modify, tear, cut, or change the foam block in any way.   MAKE SURE YOU:  Understand these instructions.  Get help right away if you are not doing well or get worse.    Thank you for letting us be a part of your medical care team.  It is a privilege we respect greatly.  We hope these instructions will help you stay on track for a fast and full recovery!   Diagnostic Studies: DG Knee Right  Port  Result Date: 12/22/2022 CLINICAL DATA:  Right total knee arthroplasty revision EXAM: PORTABLE RIGHT KNEE - 1-2 VIEW COMPARISON:  08/19/2022 FINDINGS: Frontal and cross-table lateral views of the right knee demonstrate revision of 3 component right knee arthroplasty, with long stem femoral and tibial components in the expected position without evidence of acute complication. Postsurgical changes in the overlying soft tissues. Surgical drain in place. IMPRESSION: 1. Unremarkable revision of 3 component right knee arthroplasty. Electronically Signed   By: Sharlet Salina M.D.   On: 12/22/2022 15:39    Disposition: Discharge disposition: 01-Home or Self Care       Discharge Instructions     Call MD / Call 911   Complete by: As directed    If you experience chest pain or shortness of breath, CALL 911 and be transported to the hospital emergency room.  If you develope a fever above 101 F, pus (white drainage) or increased drainage  or redness at the wound, or calf pain, call your surgeon's office.   Change dressing   Complete by: As directed    Do not remove your dressing.   Constipation Prevention   Complete by: As directed    Drink plenty of fluids.  Prune juice may be helpful.  You may use a stool softener, such as Colace (over the counter) 100 mg twice a day.  Use MiraLax (over the counter) for constipation as needed.   Diet - low sodium heart healthy   Complete by: As directed    Discharge instructions   Complete by: As directed    Elevate toes above nose. Use cryotherapy as needed for pain and swelling.   Do not put a pillow under the knee. Place it under the heel.   Complete by: As directed    Driving restrictions   Complete by: As directed    No driving for 6 weeks   Increase activity slowly as tolerated   Complete by: As directed    Lifting restrictions   Complete by: As directed    No lifting for 6 weeks   Post-operative opioid taper instructions:   Complete by: As  directed    POST-OPERATIVE OPIOID TAPER INSTRUCTIONS: It is important to wean off of your opioid medication as soon as possible. If you do not need pain medication after your surgery it is ok to stop day one. Opioids include: Codeine, Hydrocodone(Norco, Vicodin), Oxycodone(Percocet, oxycontin) and hydromorphone amongst others.  Long term and even short term use of opiods can cause: Increased pain response Dependence Constipation Depression Respiratory depression And more.  Withdrawal symptoms can include Flu like symptoms Nausea, vomiting And more Techniques to manage these symptoms Hydrate well Eat regular healthy meals Stay active Use relaxation techniques(deep breathing, meditating, yoga) Do Not substitute Alcohol to help with tapering If you have been on opioids for less than two weeks and do not have pain than it is ok to stop all together.  Plan to wean off of opioids This plan should start within one week post op of your joint replacement. Maintain the same interval or time between taking each dose and first decrease the dose.  Cut the total daily intake of opioids by one tablet each day Next start to increase the time between doses. The last dose that should be eliminated is the evening dose.      TED hose   Complete by: As directed    Use stockings (TED hose) for 2 weeks on both leg(s).  You may remove them at night for sleeping.   Weight bearing as tolerated   Complete by: As directed    With Knee immobilizer        Follow-up Information     Swinteck, Arlys John, MD. Schedule an appointment as soon as possible for a visit in 7 day(s).   Specialty: Orthopedic Surgery Why: Follow-up within 7 days of discharge for removal of Prevena negative pressure dressing and wound re-check. Contact information: [address] [place] [address] [phone]                  Signed: Clois Dupes 12/27/2022, 2:15 PM

## 2022-12-28 ENCOUNTER — Encounter (HOSPITAL_COMMUNITY): Payer: Self-pay | Admitting: Orthopedic Surgery

## 2022-12-29 DIAGNOSIS — M25561 Pain in right knee: Secondary | ICD-10-CM | POA: Diagnosis not present

## 2022-12-29 DIAGNOSIS — M25661 Stiffness of right knee, not elsewhere classified: Secondary | ICD-10-CM | POA: Diagnosis not present

## 2022-12-31 DIAGNOSIS — M25661 Stiffness of right knee, not elsewhere classified: Secondary | ICD-10-CM | POA: Diagnosis not present

## 2022-12-31 DIAGNOSIS — M25561 Pain in right knee: Secondary | ICD-10-CM | POA: Diagnosis not present

## 2023-01-03 DIAGNOSIS — M25561 Pain in right knee: Secondary | ICD-10-CM | POA: Diagnosis not present

## 2023-01-03 DIAGNOSIS — M25661 Stiffness of right knee, not elsewhere classified: Secondary | ICD-10-CM | POA: Diagnosis not present

## 2023-01-05 DIAGNOSIS — M25661 Stiffness of right knee, not elsewhere classified: Secondary | ICD-10-CM | POA: Diagnosis not present

## 2023-01-05 DIAGNOSIS — M25561 Pain in right knee: Secondary | ICD-10-CM | POA: Diagnosis not present

## 2023-01-07 DIAGNOSIS — Z96651 Presence of right artificial knee joint: Secondary | ICD-10-CM | POA: Diagnosis not present

## 2023-01-07 DIAGNOSIS — M25561 Pain in right knee: Secondary | ICD-10-CM | POA: Diagnosis not present

## 2023-01-07 DIAGNOSIS — M25661 Stiffness of right knee, not elsewhere classified: Secondary | ICD-10-CM | POA: Diagnosis not present

## 2023-01-10 DIAGNOSIS — M25661 Stiffness of right knee, not elsewhere classified: Secondary | ICD-10-CM | POA: Diagnosis not present

## 2023-01-10 DIAGNOSIS — M25561 Pain in right knee: Secondary | ICD-10-CM | POA: Diagnosis not present

## 2023-01-12 ENCOUNTER — Ambulatory Visit: Payer: Medicare Other | Admitting: Internal Medicine

## 2023-01-12 ENCOUNTER — Other Ambulatory Visit: Payer: Self-pay

## 2023-01-12 ENCOUNTER — Encounter: Payer: Self-pay | Admitting: Internal Medicine

## 2023-01-12 VITALS — BP 120/76 | HR 96 | Temp 97.3°F | Ht 71.0 in | Wt 201.0 lb

## 2023-01-12 DIAGNOSIS — Z8619 Personal history of other infectious and parasitic diseases: Secondary | ICD-10-CM

## 2023-01-12 DIAGNOSIS — T8450XS Infection and inflammatory reaction due to unspecified internal joint prosthesis, sequela: Secondary | ICD-10-CM

## 2023-01-12 DIAGNOSIS — T8453XD Infection and inflammatory reaction due to internal right knee prosthesis, subsequent encounter: Secondary | ICD-10-CM | POA: Diagnosis not present

## 2023-01-12 DIAGNOSIS — M25561 Pain in right knee: Secondary | ICD-10-CM | POA: Diagnosis not present

## 2023-01-12 DIAGNOSIS — M25661 Stiffness of right knee, not elsewhere classified: Secondary | ICD-10-CM | POA: Diagnosis not present

## 2023-01-12 NOTE — Progress Notes (Signed)
Patient: Bryan Carroll  DOB: May 02, 1951 MRN: [id number] PCP: Farris Has, MD      Patient Active Problem List   Diagnosis Date Noted   Infection of total right knee replacement, subsequent encounter 12/22/2022   Infection of total right knee replacement (HCC) 08/19/2022   Infection of total right knee replacement, initial encounter (HCC) 08/19/2022   Primary osteoarthritis of right knee 05/26/2017   Right knee DJD 05/26/2017     Subjective:  Bryan Carroll is a 72 y.o. with past medical history of DVT, hypertension, PVD, arthritis status post right TKA on 05/26/2017 with Dr. by Dr. Shelle Iron progressed well until this summer.  His right knee was aspirated on 05/11/2022 with cultures growing strep mitis/oralis placed on Keflex.  Seen by Dr. Kathline Magic in office and plan for two-stage procedure.  Taken to the OR on 1/19 for right knee arthroplasty with removal of hardware and placement of antibiotic spacer.  Or cultures grew strep mitis/oralis discharged on 6 weeks of ceftriaxone EOT 2/29 09/09/22: Pt presents with his son. He is adherent to antibiotics.   09/29/22: Doing well. No new complaints.  10/20/22: Pt presents with his son. Reports increased ROM of knee.   01/12/23: Doing well. Knee is slowly healing. HE was placed on suppressive keflex 500mg  PO bid with surgery for 6months to a year.  Review of Systems  All other systems reviewed and are negative.   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   Flexion contracture of knee, right    post op total knee 05-26-2017   GERD (gastroesophageal reflux disease)    History of colon polyps    History of DVT of lower extremity 06/13/2017   ED visit --  dvt right lower leg post op right total knee   Hypertension    Palpitations    per cardiologist note, dr Dietrich Pates, 2015--  24hr monitor show SB to ST w/  rare PVCs and rare PACs    Outpatient  Medications Prior to Visit  Medication Sig Dispense Refill   acetaminophen (TYLENOL) 325 MG tablet Take 1-2 tablets (325-650 mg total) by mouth every 6 (six) hours as needed for mild pain (pain Carroll 1-3 or temp > 100.5).     apixaban (ELIQUIS) 2.5 MG TABS tablet Take 1 tablet (2.5 mg total) by mouth 2 (two) times daily. 60 tablet 0   cephALEXin (KEFLEX) 500 MG capsule Take 1 capsule (500 mg total) by mouth 2 (two) times daily. 60 capsule 12   docusate sodium (COLACE) 100 MG capsule Take 1 capsule (100 mg total) by mouth 2 (two) times daily. 60 capsule 0   lisinopril (PRINIVIL,ZESTRIL) 20 MG tablet Take 20 mg by mouth every morning.      Multiple Vitamins-Minerals (MULTIVITAL PO) Take 1 tablet by mouth daily.     ondansetron (ZOFRAN) 4 MG tablet Take 1 tablet (4 mg total) by mouth every 8 (eight) hours as needed for nausea or vomiting. 30 tablet 0   oxyCODONE (ROXICODONE) 5 MG immediate release tablet Take 1 tablet (5 mg total) by mouth every 4 (four) hours as needed for severe pain. 42 tablet 0   polyethylene glycol (MIRALAX) 17 g packet Take 17 g by mouth daily as needed for mild constipation or moderate constipation. 14 each 0   No facility-administered medications prior to visit.     No Known Allergies  Social History  Tobacco Use   Smoking status: Never   Smokeless tobacco: Never  Vaping Use   Vaping Use: Never used  Substance Use Topics   Alcohol use: Never    Alcohol/week: 1.0 - 2.0 standard drink of alcohol    Types: 1 - 2 Glasses of wine per week    Comment: occasionally    Drug use: No    Family History  Problem Relation Age of Onset   Hypertension Mother    Heart disease Brother    Heart disease Brother     Objective:  There were no vitals filed for this visit. There is no height or weight on file to calculate BMI.  Physical Exam Constitutional:      General: He is not in acute distress.    Appearance: He is normal weight. He is not toxic-appearing.  HENT:      Head: Normocephalic and atraumatic.     Right Ear: External ear normal.     Left Ear: External ear normal.     Nose: No congestion or rhinorrhea.     Mouth/Throat:     Mouth: Mucous membranes are moist.     Pharynx: Oropharynx is clear.  Eyes:     Extraocular Movements: Extraocular movements intact.     Conjunctiva/sclera: Conjunctivae normal.     Pupils: Pupils are equal, round, and reactive to light.  Cardiovascular:     Rate and Rhythm: Normal rate and regular rhythm.     Heart sounds: No murmur heard.    No friction rub. No gallop.  Pulmonary:     Effort: Pulmonary effort is normal.     Breath sounds: Normal breath sounds.  Abdominal:     General: Abdomen is flat. Bowel sounds are normal.     Palpations: Abdomen is soft.  Musculoskeletal:        General: No swelling.     Cervical back: Normal range of motion and neck supple.  Skin:    General: Skin is warm and dry.  Neurological:     General: No focal deficit present.     Mental Status: He is oriented to person, place, and time.  Psychiatric:        Mood and Affect: Mood normal.   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 Shelle Iron did well until he developed knee pain and swelling over the summer. - Knee aspirated on 05/13/2022 at Dr. Kathline Magic office grew strep mitis/oralis patient placed on Keflex.  Prior to admission he was on Keflex twice daily dosing -  Taken to the OR for resection of right knee arthroplasty and antibiotic spacer placement.  Plan on two-stage procedure.  Or cultures with strep mitis/oralis -  Ceftriaxone x 6 weeks of antibiotics from the OR EOT 09/30/2022. Pulled PICC.  - R knee aspirated on 3/18 wbc 206,  86%neutrophils, Cx negative -On 5/22 patient underwent resection of a spacer, reimplantation of right TKA with Dr. Linna Caprice.  No purulence/infection noted per OR note.  Cultures negative. -Keflex was started on suppressive keflex 500 mg PO bid 6 months-1 year.  The role of long term(>2 weeks) suppressive keflex with negative OR Cx on 2 stage re-implantation is unclear. I will plan to reach out to Dr. Linna Caprice for suppressive indications. Knee appears to be healing well Plan: -Labs today -F/U in 6 months with ID  Danelle Earthly, MD [facility]   01/12/23  10:14 AM   I have personally spent 45 minutes involved in face-to-face and non-face-to-face activities for this patient on the day of the visit. Professional time spent includes the following activities: Preparing to see the patient (review of tests), Obtaining and/or reviewing separately obtained history (admission/discharge record), Performing a medically appropriate examination and/or evaluation , Ordering medications/tests/procedures, referring and communicating with other health care professionals, Documenting clinical information in the EMR, Independently interpreting results (not separately reported), Communicating results to the patient/family/caregiver, Counseling and educating the patient/family/caregiver and Care coordination (not separately reported).

## 2023-01-13 LAB — CBC WITH DIFFERENTIAL/PLATELET
Absolute Monocytes: 655 cells/uL (ref 200–950)
Basophils Absolute: 46 cells/uL (ref 0–200)
Basophils Relative: 0.5 %
Eosinophils Absolute: 36 cells/uL (ref 15–500)
Eosinophils Relative: 0.4 %
HCT: 37.1 % — ABNORMAL LOW (ref 38.5–50.0)
Hemoglobin: 12 g/dL — ABNORMAL LOW (ref 13.2–17.1)
Lymphs Abs: 1920 cells/uL (ref 850–3900)
MCH: 27 pg (ref 27.0–33.0)
MCHC: 32.3 g/dL (ref 32.0–36.0)
MCV: 83.4 fL (ref 80.0–100.0)
MPV: 10.6 fL (ref 7.5–12.5)
Monocytes Relative: 7.2 %
Neutro Abs: 6443 cells/uL (ref 1500–7800)
Neutrophils Relative %: 70.8 %
Platelets: 445 10*3/uL — ABNORMAL HIGH (ref 140–400)
RBC: 4.45 10*6/uL (ref 4.20–5.80)
RDW: 14.9 % (ref 11.0–15.0)
Total Lymphocyte: 21.1 %
WBC: 9.1 10*3/uL (ref 3.8–10.8)

## 2023-01-13 LAB — COMPLETE METABOLIC PANEL WITH GFR
AG Ratio: 1.5 (calc) (ref 1.0–2.5)
ALT: 14 U/L (ref 9–46)
AST: 13 U/L (ref 10–35)
Albumin: 4.2 g/dL (ref 3.6–5.1)
Alkaline phosphatase (APISO): 96 U/L (ref 35–144)
BUN: 11 mg/dL (ref 7–25)
CO2: 27 mmol/L (ref 20–32)
Calcium: 9.3 mg/dL (ref 8.6–10.3)
Chloride: 102 mmol/L (ref 98–110)
Creat: 0.77 mg/dL (ref 0.70–1.35)
Globulin: 2.8 g/dL (calc) (ref 1.9–3.7)
Glucose, Bld: 111 mg/dL — ABNORMAL HIGH (ref 65–99)
Potassium: 4.6 mmol/L (ref 3.5–5.3)
Sodium: 136 mmol/L (ref 135–146)
Total Bilirubin: 0.5 mg/dL (ref 0.2–1.2)
Total Protein: 7 g/dL (ref 6.1–8.1)
eGFR: 97 mL/min/{1.73_m2} (ref >=60)

## 2023-01-13 LAB — SEDIMENTATION RATE: Sed Rate: 60 mm/h — ABNORMAL HIGH (ref 0–20)

## 2023-01-13 LAB — C-REACTIVE PROTEIN: CRP: 26.9 mg/L — ABNORMAL HIGH (ref <8.0)

## 2023-01-14 DIAGNOSIS — M25661 Stiffness of right knee, not elsewhere classified: Secondary | ICD-10-CM | POA: Diagnosis not present

## 2023-01-14 DIAGNOSIS — M25561 Pain in right knee: Secondary | ICD-10-CM | POA: Diagnosis not present

## 2023-01-17 DIAGNOSIS — M25661 Stiffness of right knee, not elsewhere classified: Secondary | ICD-10-CM | POA: Diagnosis not present

## 2023-01-17 DIAGNOSIS — M25561 Pain in right knee: Secondary | ICD-10-CM | POA: Diagnosis not present

## 2023-01-17 NOTE — Progress Notes (Deleted)
Cardiology Office Note:    Date:  01/17/2023   ID:  Bryan Carroll, DOB 1950/11/02, MRN [id number]  PCP:  Farris Has, MD   [facility] Providers Cardiologist:  Avry Roedl  Click to update primary MD,subspecialty MD or APP then REFRESH:1}    Referring MD: Farris Has, MD   No chief complaint on file. ***  History of Present Illness: January 19, 2023    Bryan Carroll is a 72 y.o. male with a hx of  Of HTN, plapitations, DVT, I saw him 15+ years ago at my previous practice at [facility]  He has been seen by Dr Tenny Craw and Dr Judie Petit. 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   ED visit --  dvt right lower leg post op right total knee   Hypertension    Palpitations    per cardiologist note, dr Dietrich Pates, 2015--  24hr monitor show SB to ST w/  rare PVCs and rare PACs    Past Surgical History:  Procedure Laterality Date   EXAM UNDER ANESTHESIA WITH MANIPULATION OF KNEE Right 07/22/2017   Procedure: EXAM UNDER ANESTHESIA WITH MANIPULATION OF KNEE;  Surgeon: Jene Every, MD;  Location: [facility];  Service: Orthopedics;  Laterality: Right;  30 mins   EXCISIONAL TOTAL KNEE ARTHROPLASTY WITH ANTIBIOTIC SPACERS Right 08/19/2022   Procedure: EXCISIONAL TOTAL KNEE ARTHROPLASTY WITH ANTIBIOTIC SPACERS PLACEMENT;  Surgeon: Samson Frederic, MD;  Location: [facility];  Service: Orthopedics;  Laterality: Right;  240   INGUINAL HERNIA REPAIR Right 2003  approx.   REMOVAL OF CEMENTED SPACER KNEE Right 12/22/2022   Procedure: REMOVAL OF  SPACER KNEE;  Surgeon: Samson Frederic, MD;  Location: [facility];  Service: Orthopedics;   Laterality: Right;   TOTAL KNEE ARTHROPLASTY Right 05/26/2017   Procedure: RIGHT TOTAL KNEE ARTHROPLASTY;  Surgeon: Jene Every, MD;  Location: [facility];  Service: Orthopedics;  Laterality: Right;  120 mins   TOTAL KNEE REVISION Right 12/22/2022   Procedure: TOTAL KNEE REVISION;  Surgeon: Samson Frederic, MD;  Location: [facility];  Service: Orthopedics;  Laterality: Right;    Current Medications: No outpatient medications have been marked as taking for the 01/19/23 encounter (Appointment) with Yahia Bottger, Deloris Ping, MD.     Allergies:   Patient has no known allergies.   Social History   Socioeconomic History   Marital status: Married    Spouse name: Not on file   Number of children: 4   Years of education: Not on file   Highest education level: Not on file  Occupational History   Not on file  Tobacco Use   Smoking status: Never   Smokeless tobacco: Never  Vaping Use   Vaping Use: Never used  Substance and Sexual Activity   Alcohol use: Never    Alcohol/week: 1.0 - 2.0 standard drink of alcohol    Types: 1 - 2 Glasses of wine per week    Comment: occasionally    Drug use: No   Sexual activity: Not on file  Other Topics Concern   Not on file  Social History Narrative  Not on file   Social Determinants of Health   Financial Resource Strain: Not on file  Food Insecurity: No Food Insecurity (08/19/2022)   Hunger Vital Sign    Worried About Running Out of Food in the Last Year: Never true    Ran Out of Food in the Last Year: Never true  Transportation Needs: No Transportation Needs (08/19/2022)   PRAPARE - Administrator, Civil Service (Medical): No    Lack of Transportation (Non-Medical): No  Physical Activity: Not on file  Stress: Not on file  Social Connections: Not on file     Family History: [name] ***family history includes Heart disease in his brother and brother; Hypertension in his mother.  ROS:   Please see the history of present illness.     *** All other systems reviewed and are negative.  EKGs/Labs/Other Studies Reviewed:    The following studies were reviewed today: ***      Recent Labs: 01/12/2023: ALT 14; BUN 11; Creat 0.77; Hemoglobin 12.0; Platelets 445; Potassium 4.6; Sodium 136  Recent Lipid Panel No results found for: "CHOL", "TRIG", "HDL", "CHOLHDL", "VLDL", "LDLCALC", "LDLDIRECT"   Risk Assessment/Calculations:   {Does this patient have ATRIAL FIBRILLATION?:[id number]}  No BP recorded.  {Refresh Note OR Click here to enter BP  :1}***         Physical Exam:    VS:  There were no vitals taken for this visit.    Wt Readings from Last 3 Encounters:  01/12/23 201 lb (91.2 kg)  12/22/22 175 lb (79.4 kg)  12/13/22 175 lb (79.4 kg)     GEN: *** Well nourished, well developed in no acute distress HEENT: Normal NECK: No JVD; No carotid bruits LYMPHATICS: No lymphadenopathy CARDIAC: ***RRR, no murmurs, rubs, gallops RESPIRATORY:  Clear to auscultation without rales, wheezing or rhonchi  ABDOMEN: Soft, non-tender, non-distended MUSCULOSKELETAL:  No edema; No deformity  SKIN: Warm and dry NEUROLOGIC:  Alert and oriented x 3 PSYCHIATRIC:  Normal affect   ASSESSMENT:    No diagnosis found. PLAN:    In order of problems listed above:  ***      {Are you ordering a CV Procedure (e.g. stress test, cath, DCCV, TEE, etc)?   Press F2        :[id number]}    Medication Adjustments/Labs and Tests Ordered: Current medicines are reviewed at length with the patient today.  Concerns regarding medicines are outlined above.  No orders of the defined types were placed in this encounter.  No orders of the defined types were placed in this encounter.   There are no Patient Instructions on file for this visit.   Signed, Kristeen Miss, MD  01/17/2023 8:43 PM    [facility]

## 2023-01-19 ENCOUNTER — Ambulatory Visit: Payer: Self-pay | Admitting: Orthopedic Surgery

## 2023-01-19 ENCOUNTER — Ambulatory Visit: Payer: Medicare Other | Admitting: Cardiovascular Disease

## 2023-01-19 DIAGNOSIS — M25661 Stiffness of right knee, not elsewhere classified: Secondary | ICD-10-CM | POA: Diagnosis not present

## 2023-01-19 DIAGNOSIS — M25561 Pain in right knee: Secondary | ICD-10-CM | POA: Diagnosis not present

## 2023-01-21 DIAGNOSIS — M25661 Stiffness of right knee, not elsewhere classified: Secondary | ICD-10-CM | POA: Diagnosis not present

## 2023-01-21 DIAGNOSIS — M25561 Pain in right knee: Secondary | ICD-10-CM | POA: Diagnosis not present

## 2023-01-24 DIAGNOSIS — M25561 Pain in right knee: Secondary | ICD-10-CM | POA: Diagnosis not present

## 2023-01-24 DIAGNOSIS — M25661 Stiffness of right knee, not elsewhere classified: Secondary | ICD-10-CM | POA: Diagnosis not present

## 2023-01-26 DIAGNOSIS — M25661 Stiffness of right knee, not elsewhere classified: Secondary | ICD-10-CM | POA: Diagnosis not present

## 2023-01-26 DIAGNOSIS — M25561 Pain in right knee: Secondary | ICD-10-CM | POA: Diagnosis not present

## 2023-01-26 DIAGNOSIS — Z5181 Encounter for therapeutic drug level monitoring: Secondary | ICD-10-CM | POA: Diagnosis not present

## 2023-01-31 DIAGNOSIS — M25661 Stiffness of right knee, not elsewhere classified: Secondary | ICD-10-CM | POA: Diagnosis not present

## 2023-01-31 DIAGNOSIS — M25561 Pain in right knee: Secondary | ICD-10-CM | POA: Diagnosis not present

## 2023-02-02 DIAGNOSIS — M25661 Stiffness of right knee, not elsewhere classified: Secondary | ICD-10-CM | POA: Diagnosis not present

## 2023-02-02 DIAGNOSIS — M25561 Pain in right knee: Secondary | ICD-10-CM | POA: Diagnosis not present

## 2023-02-07 DIAGNOSIS — M25661 Stiffness of right knee, not elsewhere classified: Secondary | ICD-10-CM | POA: Diagnosis not present

## 2023-02-07 DIAGNOSIS — M25561 Pain in right knee: Secondary | ICD-10-CM | POA: Diagnosis not present

## 2023-02-07 DIAGNOSIS — Z96651 Presence of right artificial knee joint: Secondary | ICD-10-CM | POA: Diagnosis not present

## 2023-02-09 DIAGNOSIS — M25561 Pain in right knee: Secondary | ICD-10-CM | POA: Diagnosis not present

## 2023-02-09 DIAGNOSIS — M25661 Stiffness of right knee, not elsewhere classified: Secondary | ICD-10-CM | POA: Diagnosis not present

## 2023-02-11 DIAGNOSIS — M25561 Pain in right knee: Secondary | ICD-10-CM | POA: Diagnosis not present

## 2023-02-11 DIAGNOSIS — M25661 Stiffness of right knee, not elsewhere classified: Secondary | ICD-10-CM | POA: Diagnosis not present

## 2023-02-14 DIAGNOSIS — M25561 Pain in right knee: Secondary | ICD-10-CM | POA: Diagnosis not present

## 2023-02-14 DIAGNOSIS — M25661 Stiffness of right knee, not elsewhere classified: Secondary | ICD-10-CM | POA: Diagnosis not present

## 2023-02-16 DIAGNOSIS — M25561 Pain in right knee: Secondary | ICD-10-CM | POA: Diagnosis not present

## 2023-02-16 DIAGNOSIS — M25661 Stiffness of right knee, not elsewhere classified: Secondary | ICD-10-CM | POA: Diagnosis not present

## 2023-02-18 DIAGNOSIS — M25661 Stiffness of right knee, not elsewhere classified: Secondary | ICD-10-CM | POA: Diagnosis not present

## 2023-02-18 DIAGNOSIS — M25561 Pain in right knee: Secondary | ICD-10-CM | POA: Diagnosis not present

## 2023-02-22 DIAGNOSIS — M25661 Stiffness of right knee, not elsewhere classified: Secondary | ICD-10-CM | POA: Diagnosis not present

## 2023-02-22 DIAGNOSIS — M25561 Pain in right knee: Secondary | ICD-10-CM | POA: Diagnosis not present

## 2023-02-24 DIAGNOSIS — M25661 Stiffness of right knee, not elsewhere classified: Secondary | ICD-10-CM | POA: Diagnosis not present

## 2023-02-24 DIAGNOSIS — M25561 Pain in right knee: Secondary | ICD-10-CM | POA: Diagnosis not present

## 2023-03-01 DIAGNOSIS — M25561 Pain in right knee: Secondary | ICD-10-CM | POA: Diagnosis not present

## 2023-03-01 DIAGNOSIS — M25661 Stiffness of right knee, not elsewhere classified: Secondary | ICD-10-CM | POA: Diagnosis not present

## 2023-03-03 DIAGNOSIS — M25561 Pain in right knee: Secondary | ICD-10-CM | POA: Diagnosis not present

## 2023-03-03 DIAGNOSIS — M25661 Stiffness of right knee, not elsewhere classified: Secondary | ICD-10-CM | POA: Diagnosis not present

## 2023-03-08 DIAGNOSIS — M25561 Pain in right knee: Secondary | ICD-10-CM | POA: Diagnosis not present

## 2023-03-08 DIAGNOSIS — M25661 Stiffness of right knee, not elsewhere classified: Secondary | ICD-10-CM | POA: Diagnosis not present

## 2023-03-10 DIAGNOSIS — M25661 Stiffness of right knee, not elsewhere classified: Secondary | ICD-10-CM | POA: Diagnosis not present

## 2023-03-10 DIAGNOSIS — M25561 Pain in right knee: Secondary | ICD-10-CM | POA: Diagnosis not present

## 2023-03-15 DIAGNOSIS — M25561 Pain in right knee: Secondary | ICD-10-CM | POA: Diagnosis not present

## 2023-03-15 DIAGNOSIS — M25661 Stiffness of right knee, not elsewhere classified: Secondary | ICD-10-CM | POA: Diagnosis not present

## 2023-03-17 DIAGNOSIS — M25661 Stiffness of right knee, not elsewhere classified: Secondary | ICD-10-CM | POA: Diagnosis not present

## 2023-03-17 DIAGNOSIS — M25561 Pain in right knee: Secondary | ICD-10-CM | POA: Diagnosis not present

## 2023-03-22 DIAGNOSIS — M25561 Pain in right knee: Secondary | ICD-10-CM | POA: Diagnosis not present

## 2023-03-22 DIAGNOSIS — M25661 Stiffness of right knee, not elsewhere classified: Secondary | ICD-10-CM | POA: Diagnosis not present

## 2023-03-24 DIAGNOSIS — M25561 Pain in right knee: Secondary | ICD-10-CM | POA: Diagnosis not present

## 2023-03-24 DIAGNOSIS — M25661 Stiffness of right knee, not elsewhere classified: Secondary | ICD-10-CM | POA: Diagnosis not present

## 2023-03-29 DIAGNOSIS — M25661 Stiffness of right knee, not elsewhere classified: Secondary | ICD-10-CM | POA: Diagnosis not present

## 2023-03-29 DIAGNOSIS — M25561 Pain in right knee: Secondary | ICD-10-CM | POA: Diagnosis not present

## 2023-03-31 DIAGNOSIS — M25561 Pain in right knee: Secondary | ICD-10-CM | POA: Diagnosis not present

## 2023-03-31 DIAGNOSIS — M25661 Stiffness of right knee, not elsewhere classified: Secondary | ICD-10-CM | POA: Diagnosis not present

## 2023-05-31 DIAGNOSIS — Z23 Encounter for immunization: Secondary | ICD-10-CM | POA: Diagnosis not present

## 2023-06-09 IMAGING — CT CT CARDIAC CORONARY ARTERY CALCIUM SCORE
3 series · 14 of 20 positions shown, 16 images · non-contrast
Comparison: None.

CLINICAL DATA: Hyperlipidemia.  Nonsmoker.

EXAM:
CT CARDIAC CORONARY ARTERY CALCIUM SCORE
TECHNIQUE: Non-contrast imaging through the heart was performed using
prospective ECG gating. Image post processing was performed on an
independent workstation, allowing for quantitative analysis of the
heart and coronary arteries. Note that this exam targets the heart
and the chest was not imaged in its entirety.

[Series 3: calcium scoring 2.00 br40 bestdiast 68% axial · axial · 0.63mm/px · z∈[+1603,+1687]mm · 4 of 70 slices shown]
[im 14/70  vessel]
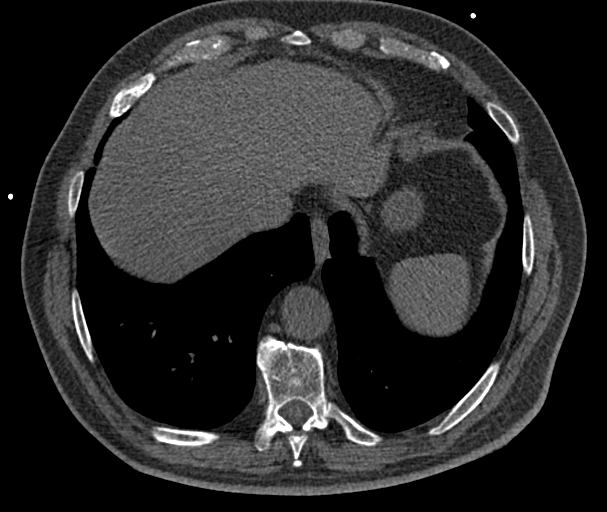
[im 28/70  vessel]
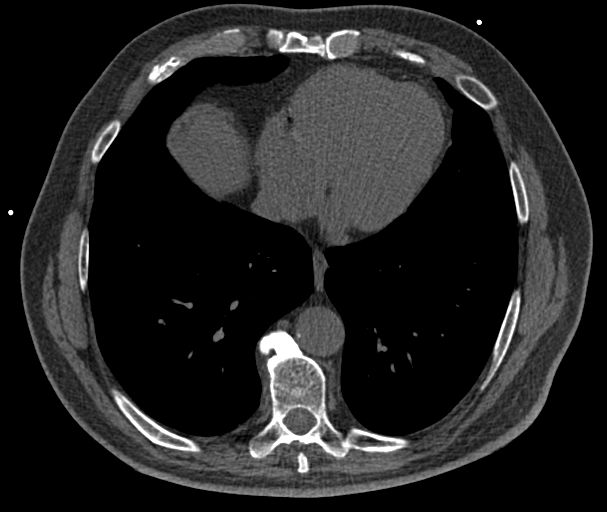
[im 42/70  vessel]
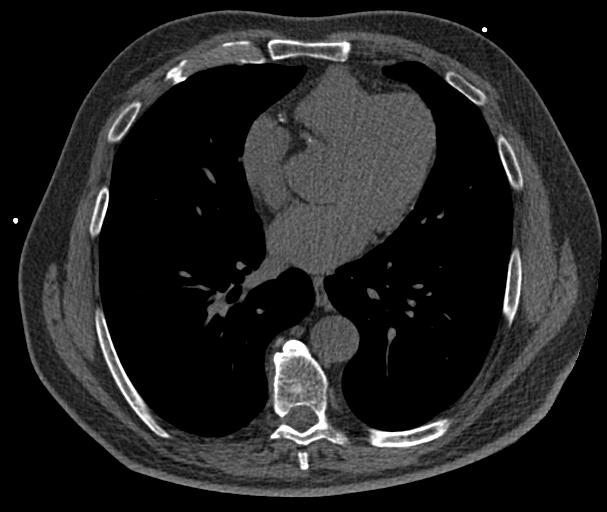
[im 56/70  vessel]
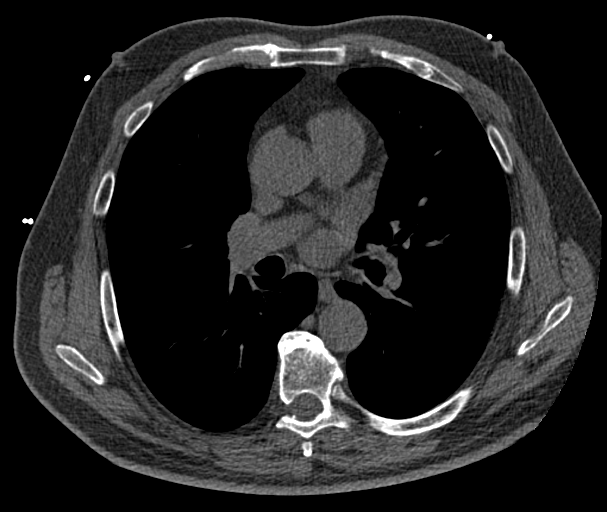

[Series 9: calcium scoring 2.00 br60 bestdiast 68% lungs · axial · 0.65mm/px · z∈[+1599,+1691]mm · 5 of 70 slices shown, 7 images]
[im 12/70  vessel]
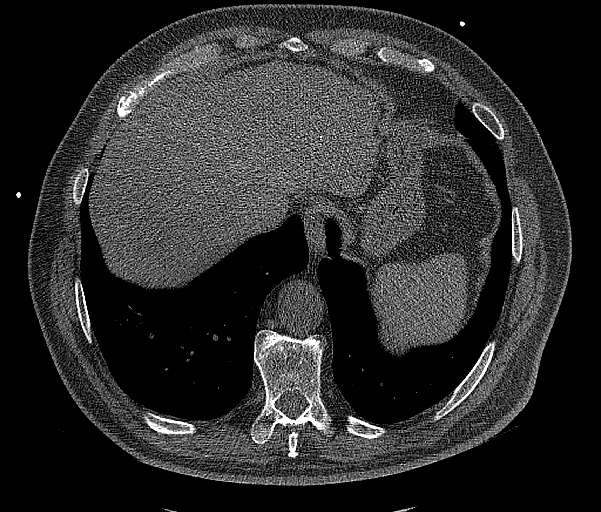
[im 12/70  lung]
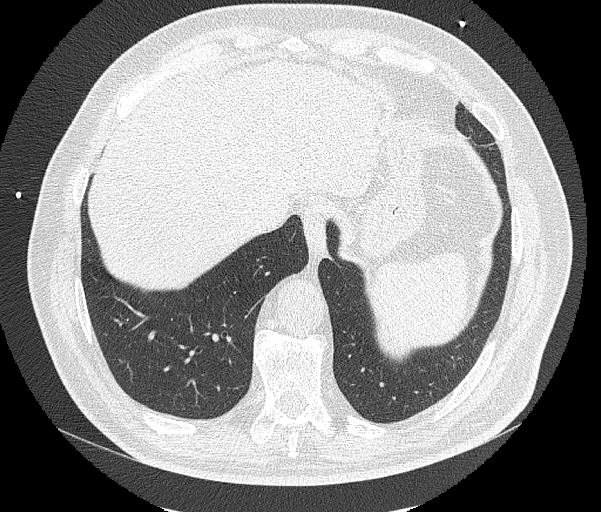
[im 24/70  vessel]
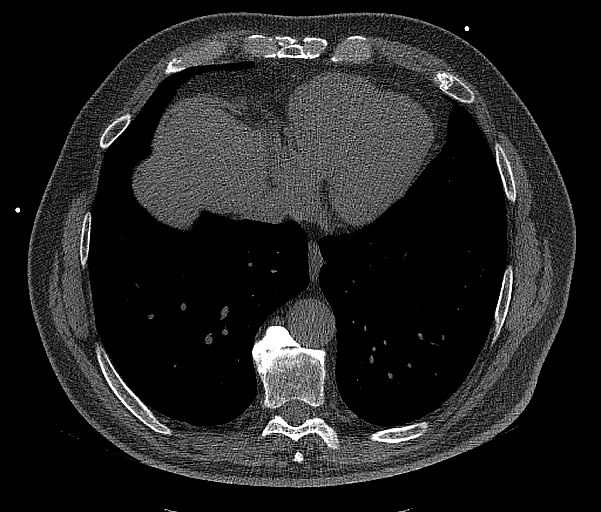
[im 35/70  vessel]
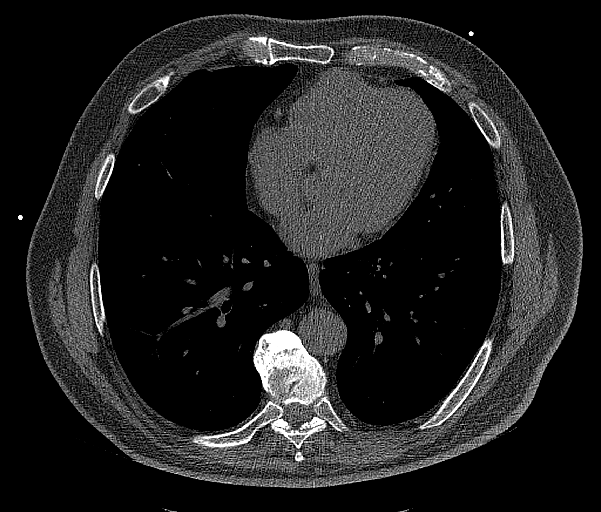
[im 47/70  vessel]
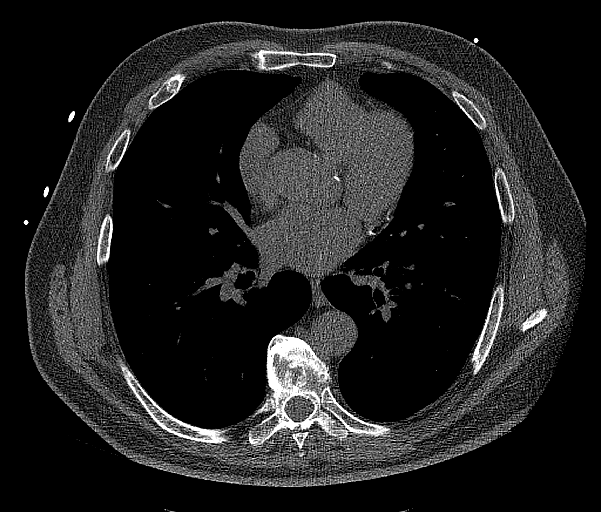
[im 58/70  vessel]
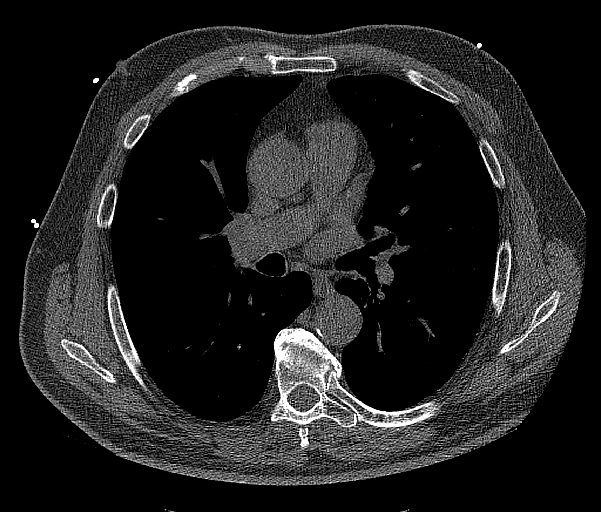
[im 58/70  lung]
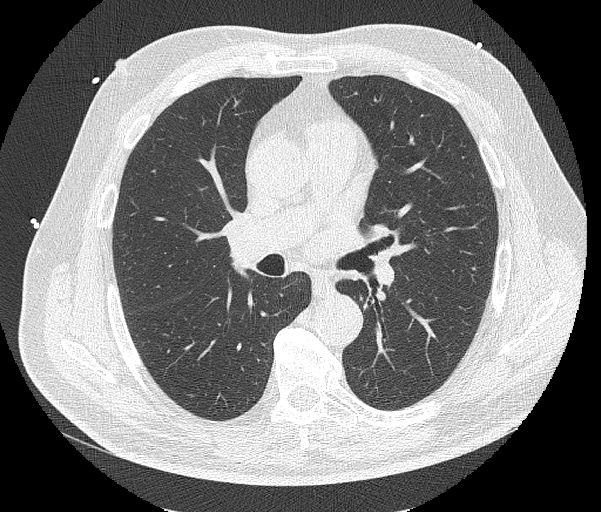

[Series 12: calcium scoring 2.00 qr36 bestdiast 68% hrt calciu · axial · 0.51mm/px · z∈[+1599,+1691]mm · 5 of 70 slices shown]
[im 12/70  vessel]
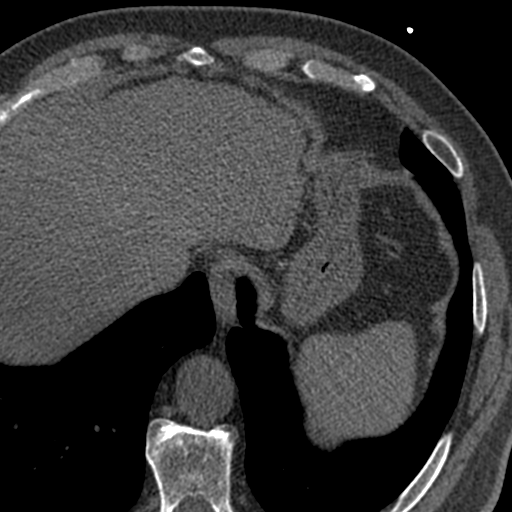
[im 24/70  vessel]
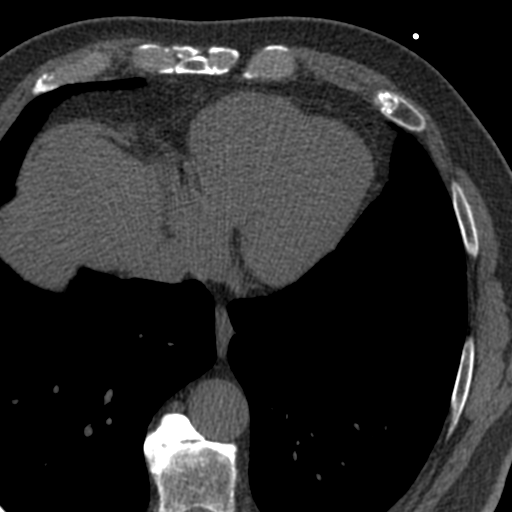
[im 35/70  vessel]
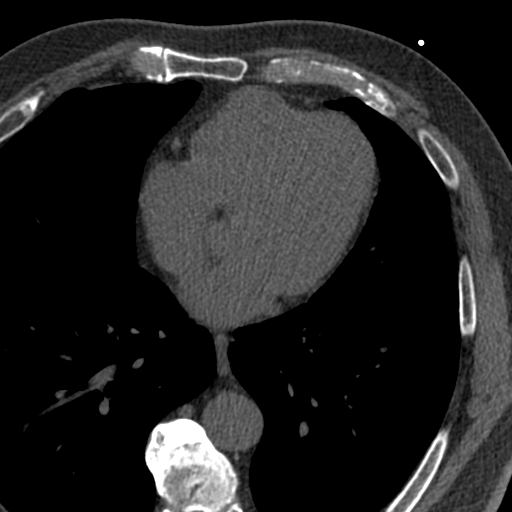
[im 47/70  vessel]
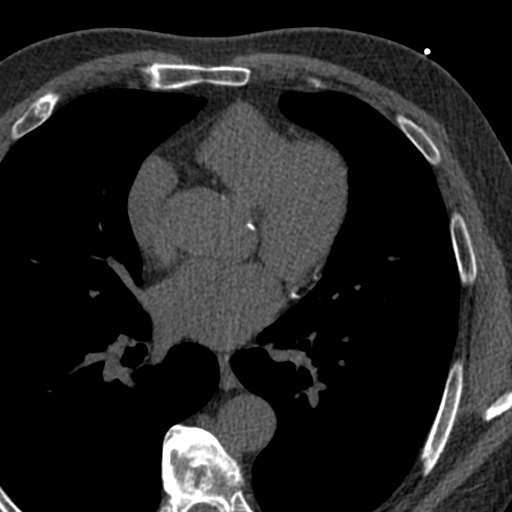
[im 58/70  vessel]
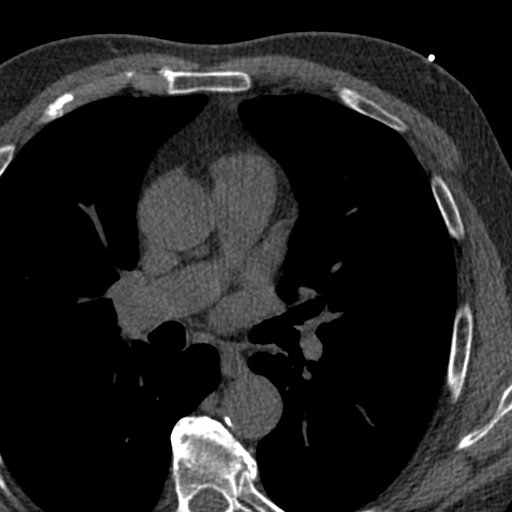

[14 of 20 positions shown; findings below may reference images not displayed]

FINDINGS: CORONARY CALCIUM SCORES:

Left Main: 117

LAD: 219

LCx: 94

RCA: 201

Total Agatston Score: 631

[HOSPITAL] percentile: 93rd

AORTA MEASUREMENTS:

Ascending Aorta: 35 mm

Descending Aorta: 30 mm

OTHER FINDINGS:

Cardiovascular: Aortic atherosclerosis.  Tortuous thoracic aorta.

Mediastinum/Nodes: Clear imaged lungs.

Lungs/Pleura: No pleural fluid.  Clear imaged lungs.

Upper Abdomen: Mild hepatic steatosis. Normal imaged portions of the
spleen, stomach.

Musculoskeletal: Midthoracic spondylosis.
IMPRESSION: 1. Total Agatston score of 631, corresponding to 93rd percentile for
age, sex, and race based cohort.
2. Hepatic steatosis.
3. Aortic Atherosclerosis ([id number]).

## 2023-07-07 DIAGNOSIS — H52223 Regular astigmatism, bilateral: Secondary | ICD-10-CM | POA: Diagnosis not present

## 2023-07-12 ENCOUNTER — Encounter: Payer: Self-pay | Admitting: Internal Medicine

## 2023-07-12 ENCOUNTER — Other Ambulatory Visit: Payer: Self-pay

## 2023-07-12 ENCOUNTER — Ambulatory Visit: Payer: Medicare Other | Admitting: Internal Medicine

## 2023-07-12 VITALS — BP 130/77 | HR 69 | Resp 16 | Ht 71.0 in | Wt 212.0 lb

## 2023-07-12 DIAGNOSIS — M25461 Effusion, right knee: Secondary | ICD-10-CM | POA: Diagnosis not present

## 2023-07-12 DIAGNOSIS — T8453XD Infection and inflammatory reaction due to internal right knee prosthesis, subsequent encounter: Secondary | ICD-10-CM

## 2023-07-12 DIAGNOSIS — T8450XS Infection and inflammatory reaction due to unspecified internal joint prosthesis, sequela: Secondary | ICD-10-CM

## 2023-07-12 DIAGNOSIS — M25561 Pain in right knee: Secondary | ICD-10-CM | POA: Diagnosis not present

## 2023-07-12 DIAGNOSIS — T8453XS Infection and inflammatory reaction due to internal right knee prosthesis, sequela: Secondary | ICD-10-CM | POA: Diagnosis not present

## 2023-07-12 DIAGNOSIS — Z8619 Personal history of other infectious and parasitic diseases: Secondary | ICD-10-CM | POA: Diagnosis not present

## 2023-07-12 NOTE — Progress Notes (Signed)
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   APIXABAN (ELIQUIS) 2.5 MG TABS TABLET    Take 1 tablet (2.5 mg total) by mouth 2 (two) times daily.   CEPHALEXIN (KEFLEX) 500 MG CAPSULE    Take 500 mg by mouth 2 (two) times daily.   LISINOPRIL (PRINIVIL,ZESTRIL) 20 MG TABLET    Take 20 mg by mouth every morning.    MULTIPLE VITAMINS-MINERALS (MULTIVITAL PO)    Take 1 tablet by mouth daily.   ONDANSETRON (ZOFRAN) 4 MG TABLET    Take 1 tablet (4 mg total) by mouth every 8 (eight) hours as needed for nausea or vomiting.   OXYCODONE (ROXICODONE) 5 MG IMMEDIATE RELEASE TABLET    Take 1 tablet (5 mg total) by mouth every 4 (four) hours as needed for severe pain.  Modified Medications   No medications on file  Discontinued Medications   No medications on file    Subjective:  72 YM with past medical history of DVT, hypertension, PVD, arthritis status post right TKA on 05/26/2017 with Dr. by Dr. Shelle Iron progressed well until this summer.  His right knee was aspirated on 05/11/2022 with cultures growing strep mitis/oralis placed on Keflex.  Seen by Dr. Kathline Magic in office and plan for two-stage procedure.  Taken to the OR on 1/19 for right knee arthroplasty with removal of hardware and placement of antibiotic spacer.  Or cultures grew strep mitis/oralis discharged on 6 weeks of ceftriaxone EOT 2/29 09/09/22: Pt presents with his son. He is  adherent to antibiotics.   09/29/22: Doing well. No new complaints.  10/20/22: Pt presents with his son. Reports increased ROM of knee.   01/12/23: Doing well. Knee is slowly healing. HE was placed on suppressive keflex 500mg  PO bid with surgery for 6months to a year.  Today 12/10 : No new complaints. Taking keflex. No fevers or chills.    Review of Systems: Review of Systems  All other systems reviewed and are negative.   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   Flexion contracture of knee, right    post op total knee 05-26-2017   GERD (gastroesophageal reflux disease)    History of colon polyps    History of DVT of lower extremity 06/13/2017   ED visit --  dvt right lower leg post op right total knee   Hypertension    Palpitations    per cardiologist note, dr Dietrich Pates, 2015--  24hr monitor show SB to ST w/  rare PVCs and rare PACs    Social History   Tobacco Use   Smoking status: Never   Smokeless tobacco: Never  Vaping Use   Vaping status: Never Used  Substance Use Topics   Alcohol use: Never    Alcohol/week: 1.0 - 2.0 standard drink  of alcohol    Types: 1 - 2 Glasses of wine per week    Comment: occasionally    Drug use: No    Family History  Problem Relation Age of Onset   Hypertension Mother    Heart disease Brother    Heart disease Brother     No Known Allergies  Health Maintenance  Topic Date Due   Medicare Annual Wellness (AWV)  Never done   Hepatitis C Screening  Never done   DTaP/Tdap/Td (1 - Tdap) Never done   Colonoscopy  Never done   Zoster Vaccines- Shingrix (1 of 2) Never done   Pneumonia Vaccine 92+ Years old (1 of 1 - PCV) Never done   INFLUENZA VACCINE  Never done   COVID-19 Vaccine (3 - 2024-25 season) 04/03/2023   HPV VACCINES  Aged Out    Objective:  Vitals:   07/12/23 1113  BP: 130/77  Pulse: 69  Resp: 16  Weight: 212 lb (96.2 kg)  Height: 5\' 11"  (1.803  m)   Body mass index is 29.57 kg/m.  Physical Exam Constitutional:      General: He is not in acute distress.    Appearance: He is normal weight. He is not toxic-appearing.  HENT:     Head: Normocephalic and atraumatic.     Right Ear: External ear normal.     Left Ear: External ear normal.     Nose: No congestion or rhinorrhea.     Mouth/Throat:     Mouth: Mucous membranes are moist.     Pharynx: Oropharynx is clear.  Eyes:     Extraocular Movements: Extraocular movements intact.     Conjunctiva/sclera: Conjunctivae normal.     Pupils: Pupils are equal, round, and reactive to light.  Cardiovascular:     Rate and Rhythm: Normal rate and regular rhythm.     Heart sounds: No murmur heard.    No friction rub. No gallop.  Pulmonary:     Effort: Pulmonary effort is normal.     Breath sounds: Normal breath sounds.  Abdominal:     General: Abdomen is flat. Bowel sounds are normal.     Palpations: Abdomen is soft.  Musculoskeletal:        General: No swelling.     Cervical back: Normal range of motion and neck supple.  Skin:    General: Skin is warm and dry.  Neurological:     General: No focal deficit present.     Mental Status: He is oriented to person, place, and time.  Psychiatric:        Mood and Affect: Mood normal.    Lab Results Lab Results  Component Value Date   WBC 9.1 01/12/2023   HGB 12.0 (L) 01/12/2023   HCT 37.1 (L) 01/12/2023   MCV 83.4 01/12/2023   PLT 445 (H) 01/12/2023    Lab Results  Component Value Date   CREATININE 0.77 01/12/2023   BUN 11 01/12/2023   NA 136 01/12/2023   K 4.6 01/12/2023   CL 102 01/12/2023   CO2 27 01/12/2023    Lab Results  Component Value Date   ALT 14 01/12/2023   AST 13 01/12/2023   BILITOT 0.5 01/12/2023    No results found for: "CHOL", "HDL", "LDLCALC", "LDLDIRECT", "TRIG", "CHOLHDL" No results found for: "LABRPR", "RPRTITER" No results found for: "HIV1RNAQUANT", "HIV1RNAVL", "CD4TABS"   Problem List Items  Addressed This Visit   None Visit Diagnoses       Infection of  prosthetic joint, sequela    -  Primary   Relevant Medications   cephALEXin (KEFLEX) 500 MG capsule      Results   LABS ESR: elevated CRP: elevated       Assessment and Plan   #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 Shelle Iron did well until he developed knee pain and swelling over the summer. - Knee aspirated on 05/13/2022 at Dr. Kathline Magic office grew strep mitis/oralis patient placed on Keflex.  Prior to admission he was on Keflex twice daily dosing - Taken to the OR for resection of right knee arthroplasty and antibiotic spacer placement.  Plan on two-stage procedure.  Or cultures with strep mitis/oralis -  Ceftriaxone x 6 weeks of antibiotics from the OR EOT 09/30/2022. Pulled PICC.  - R knee aspirated on 3/18 wbc 206, 86%neutrophils, Cx negative -On 5/22 patient underwent resection of a spacer, reimplantation of right TKA with Dr. Linna Caprice.  No purulence/infection noted per OR note.  Cultures negative. -Keflex was started on suppressive keflex 500 mg PO bid 6 months-1 year by ortho.  Discussed the role of long term(>2 weeks) suppressive keflex with negative OR Cx on 2 stage re-implantation is unclear. Pt plans on continuing keflex  Plan -Labs today -Knee is healed. - F/U PRN with ID     Danelle Earthly, MD [facility] 07/18/2023, 6:10 AM   I have personally spent 35 minutes involved in face-to-face and non-face-to-face activities for this patient on the day of the visit. Professional time spent includes the following activities: Preparing to see the patient (review of tests), Obtaining and/or reviewing separately obtained history (admission/discharge record), Performing a medically appropriate examination and/or evaluation , Ordering medications/tests/procedures,  referring and communicating with other health care professionals, Documenting clinical information in the EMR, Independently interpreting results (not separately reported), Communicating results to the patient/family/caregiver, Counseling and educating the patient/family/caregiver and Care coordination (not separately reported).

## 2023-07-19 DIAGNOSIS — Z0000 Encounter for general adult medical examination without abnormal findings: Secondary | ICD-10-CM | POA: Diagnosis not present

## 2023-07-19 DIAGNOSIS — M25569 Pain in unspecified knee: Secondary | ICD-10-CM | POA: Diagnosis not present

## 2023-07-19 DIAGNOSIS — I700 Atherosclerosis of aorta: Secondary | ICD-10-CM | POA: Diagnosis not present

## 2023-07-19 DIAGNOSIS — Z125 Encounter for screening for malignant neoplasm of prostate: Secondary | ICD-10-CM | POA: Diagnosis not present

## 2023-07-19 DIAGNOSIS — R7309 Other abnormal glucose: Secondary | ICD-10-CM | POA: Diagnosis not present

## 2023-07-19 DIAGNOSIS — E785 Hyperlipidemia, unspecified: Secondary | ICD-10-CM | POA: Diagnosis not present

## 2023-07-19 DIAGNOSIS — I10 Essential (primary) hypertension: Secondary | ICD-10-CM | POA: Diagnosis not present

## 2023-08-09 ENCOUNTER — Telehealth: Payer: Self-pay | Admitting: *Deleted

## 2023-08-09 NOTE — Telephone Encounter (Signed)
   Pre-operative Risk Assessment    Patient Name: Bryan Carroll  DOB: August 13, 1950 MRN: [id number]   Date of last office visit: 06/2022 Date of next office visit: NONE   Request for Surgical Clearance    Procedure:   COLONOSCOPY  Date of Surgery:  Clearance 08/30/23                                Surgeon:  DR. LAMAR BUNK Surgeon's Group or Practice Name:  [facility] Phone number:  [phone] Fax number:  [phone]   Type of Clearance Requested:   - Medical  - Pharmacy:  Hold Apixaban  (Eliquis ) NOT INDICATED   Type of Anesthesia:   PROPOFOL    Additional requests/questions:    [name]   08/09/2023, 2:06 PM

## 2023-08-10 NOTE — Telephone Encounter (Signed)
 Patient with diagnosis of DVT on Eliquis  for anticoagulation.    Procedure: colonoscopy Date of procedure: 08/30/23   CrCl 121 ml/min Platelet count 445K   Per office protocol, patient can hold Eliquis  for 2 days prior to procedure.    **This guidance is not considered finalized until pre-operative APP has relayed final recommendations.**

## 2023-08-10 NOTE — Telephone Encounter (Signed)
    Patient Name: Bryan Carroll  DOB: [birth year] MRN: [id number]  Primary Cardiologist: Alvan Ronal BRAVO, MD  Chart reviewed as part of pre-operative protocol coverage.   The patient already has an upcoming appointment scheduled 08/24/23 with Glendia Ferrier PA-C at which time this clearance should be addressed. (Procedure date of 08/30/23 falls after appointment.) Patient needs OV before clearing given >1 year since last seen.  - Will route to pharm for anticoag recs.  - Will route to [place] so he is aware. This is already in appt notes. Per office protocol, the provider seeing this patient should forward their finalized clearance decision to requesting party below.  - Will fax update to requesting surgeon so they are aware. Will remove from preop box as separate preop APP input not necessary at this time.  Ifrah Vest N Anara Cowman, PA-C 08/10/2023, 9:06 AM

## 2023-08-10 NOTE — Telephone Encounter (Signed)
 Noted, pharm recs now available for upcoming OV. Will remove from preop APP box.

## 2023-08-23 DIAGNOSIS — I10 Essential (primary) hypertension: Secondary | ICD-10-CM | POA: Insufficient documentation

## 2023-08-23 DIAGNOSIS — E7800 Pure hypercholesterolemia, unspecified: Secondary | ICD-10-CM | POA: Insufficient documentation

## 2023-08-23 DIAGNOSIS — I2510 Atherosclerotic heart disease of native coronary artery without angina pectoris: Secondary | ICD-10-CM | POA: Insufficient documentation

## 2023-08-23 NOTE — Progress Notes (Unsigned)
Cardiology Office Note:    Date:  08/24/2023  ID:  Bryan, Carroll 22-May-1951, MRN [id number] PCP: Bryan Has, MD  [facility] Providers Cardiologist:  Bryan Fus, MD       Patient Profile:      Coronary artery disease  CAC score 08/06/2021: 631 (93rd percentile), aortic atherosclerosis, hepatic steatosis Hypertension Hyperlipidemia Hx of right leg DVT 2018 Anticoagulation completed PVCs GERD         History of Present Illness:  Discussed the use of AI scribe software for clinical note transcription with the patient, who gave verbal consent to proceed.  Bryan Carroll is a 73 y.o. male who returns for follow-up of CAD, surgical clearance.  He was last seen by Dr. Wyline Carroll in June 2023 with as needed follow-up recommended.  He needs a colonoscopy 08/30/2023 with Dr. Matthias Carroll.  Chart was reviewed by our clinical pharmacist.  Per protocol, Eliquis can be held for 2 days.He is here alone. He no longer takes Eliquis. This was stopped a couple of years ago. The patient was started on Atorvastatin 10mg  in December by his PCP.  The patient denied any chest pain, shortness of breath, or swelling in the legs. The patient reported being able to climb a flight of stairs without stopping and doing regular exercise on a bicycle without any chest discomfort or shortness of breath. The patient denied any fainting spells.     Review of Systems  Hematologic/Lymphatic: Bruises/bleeds easily.  Musculoskeletal:  Positive for joint pain.  -See HPI    Studies Reviewed:   EKG Interpretation Date/Time:  Wednesday August 24 2023 10:23:18 EST Ventricular Rate:  59 PR Interval:  194 QRS Duration:  90 QT Interval:  378 QTC Calculation: 374 R Axis:   -4  Text Interpretation: Sinus bradycardia with sinus arrhythmia Inferior infarct , age undetermined Anterior infarct , age undetermined No significant change since last tracing dated 01/14/2022 Confirmed by Bryan Carroll [phone])  on 08/24/2023 10:28:22 AM           Risk Assessment/Calculations:             Physical Exam:   VS:  BP 121/62   Pulse (!) 59   Ht 5\' 10"  (1.778 m)   Wt 215 lb 12.8 oz (97.9 kg)   SpO2 98%   BMI 30.96 kg/m    Wt Readings from Last 3 Encounters:  08/24/23 215 lb 12.8 oz (97.9 kg)  07/12/23 212 lb (96.2 kg)  01/12/23 201 lb (91.2 kg)    Constitutional:      Appearance: Healthy appearance. Not in distress.  Neck:     Vascular: No carotid bruit. JVD normal.  Pulmonary:     Breath sounds: Normal breath sounds. No wheezing. No rales.  Cardiovascular:     Normal rate. Regular rhythm.     Murmurs: There is no murmur.  Edema:    Peripheral edema absent.  Abdominal:     Palpations: Abdomen is soft.      Assessment and Plan:   Assessment & Plan Preoperative cardiovascular examination [name] perioperative risk of a major cardiac event is 0.4% according to the Revised Cardiac Risk Index (RCRI).  Therefore, he is at low risk for perioperative complications.   His functional capacity is good at 4.31 METs according to the Duke Activity Status Index (DASI). Of note, he is no longer on Apixaban.  Recommendations: According to ACC/AHA guidelines, no further cardiovascular testing needed.  The patient may proceed to  surgery at acceptable risk.   Antiplatelet and/or Anticoagulation Recommendations: Aspirin can be held for 7 days prior to his surgery.  Please resume Aspirin post operatively when it is felt to be safe from a bleeding standpoint.  Coronary artery calcification seen on CT scan CAC score in 08/2021 631 (93rd percentile).  He is doing well without chest discomfort to suggest angina. -Continue ASA 81 mg daily, atorvastatin 10 mg daily Essential hypertension Blood pressure well-controlled.   -Continue lisinopril 20 mg daily. Pure hypercholesterolemia LDL in December 2024 was 138 (Per [facility]).  Goal for him is <70.  Hyperlipidemia is managed by primary care.       Dispo:   Return in about 1 year (around 08/23/2024) for Routine Follow Up, w/ Dr. Elease Carroll, or Bryan Newcomer, PA-C.  Signed, Bryan Newcomer, PA-C

## 2023-08-24 ENCOUNTER — Ambulatory Visit: Payer: Medicare Other | Attending: Physician Assistant | Admitting: Physician Assistant

## 2023-08-24 ENCOUNTER — Encounter: Payer: Self-pay | Admitting: Physician Assistant

## 2023-08-24 VITALS — BP 121/62 | HR 59 | Ht 70.0 in | Wt 215.8 lb

## 2023-08-24 DIAGNOSIS — E7800 Pure hypercholesterolemia, unspecified: Secondary | ICD-10-CM

## 2023-08-24 DIAGNOSIS — Z01810 Encounter for preprocedural cardiovascular examination: Secondary | ICD-10-CM

## 2023-08-24 DIAGNOSIS — I10 Essential (primary) hypertension: Secondary | ICD-10-CM

## 2023-08-24 DIAGNOSIS — I2510 Atherosclerotic heart disease of native coronary artery without angina pectoris: Secondary | ICD-10-CM

## 2023-08-24 NOTE — Telephone Encounter (Signed)
Notes faxed to GI Tereso Newcomer, PA-C    08/24/2023 5:16 PM

## 2023-08-24 NOTE — Assessment & Plan Note (Signed)
CAC score in 08/2021 631 (93rd percentile).  He is doing well without chest discomfort to suggest angina. -Continue ASA 81 mg daily, atorvastatin 10 mg daily

## 2023-08-24 NOTE — Assessment & Plan Note (Signed)
LDL in December 2024 was 138 (Per [facility]).  Goal for him is <70.  Hyperlipidemia is managed by primary care.

## 2023-08-24 NOTE — Assessment & Plan Note (Signed)
 Blood pressure well controlled.  Continue lisinopril 20 mg daily.

## 2023-08-24 NOTE — Patient Instructions (Signed)
Medication Instructions:  Your physician recommends that you continue on your current medications as directed. Please refer to the Current Medication list given to you today.  *If you need a refill on your cardiac medications before your next appointment, please call your pharmacy*   Lab Work: None If you have labs (blood work) drawn today and your tests are completely normal, you will receive your results only by: [place] (if you have MyChart) OR A paper copy in the mail If you have any lab test that is abnormal or we need to change your treatment, we will call you to review the results.   Testing/Procedures: None   Follow-Up: At [facility], you and your health needs are our priority.  As part of our continuing mission to provide you with exceptional heart care, we have created designated Provider Care Teams.  These Care Teams include your primary Cardiologist (physician) and Advanced Practice Providers (APPs -  Physician Assistants and Nurse Practitioners) who all work together to provide you with the care you need, when you need it.  We recommend signing up for the patient portal called "MyChart".  Sign up information is provided on this After Visit Summary.  MyChart is used to connect with patients for Virtual Visits (Telemedicine).  Patients are able to view lab/test results, encounter notes, upcoming appointments, etc.  Non-urgent messages can be sent to your provider as well.   To learn more about what you can do with MyChart, go to [email or web address].    Your next appointment:   1 year(s)  The format for your next appointment:   In Person  Provider:   Richardson Dopp, PA-C   Other Instructions

## 2023-08-30 DIAGNOSIS — D122 Benign neoplasm of ascending colon: Secondary | ICD-10-CM | POA: Diagnosis not present

## 2023-08-30 DIAGNOSIS — Z09 Encounter for follow-up examination after completed treatment for conditions other than malignant neoplasm: Secondary | ICD-10-CM | POA: Diagnosis not present

## 2023-08-30 DIAGNOSIS — Z860101 Personal history of adenomatous and serrated colon polyps: Secondary | ICD-10-CM | POA: Diagnosis not present

## 2023-08-30 DIAGNOSIS — K5730 Diverticulosis of large intestine without perforation or abscess without bleeding: Secondary | ICD-10-CM | POA: Diagnosis not present

## 2023-09-01 DIAGNOSIS — D122 Benign neoplasm of ascending colon: Secondary | ICD-10-CM | POA: Diagnosis not present

## 2023-10-28 DIAGNOSIS — Z96651 Presence of right artificial knee joint: Secondary | ICD-10-CM | POA: Diagnosis not present

## 2023-10-28 DIAGNOSIS — T8453XD Infection and inflammatory reaction due to internal right knee prosthesis, subsequent encounter: Secondary | ICD-10-CM | POA: Diagnosis not present

## 2024-04-09 DIAGNOSIS — L818 Other specified disorders of pigmentation: Secondary | ICD-10-CM | POA: Diagnosis not present

## 2024-04-26 DIAGNOSIS — L821 Other seborrheic keratosis: Secondary | ICD-10-CM | POA: Diagnosis not present

## 2024-04-26 DIAGNOSIS — D485 Neoplasm of uncertain behavior of skin: Secondary | ICD-10-CM | POA: Diagnosis not present

## 2024-04-26 DIAGNOSIS — L820 Inflamed seborrheic keratosis: Secondary | ICD-10-CM | POA: Diagnosis not present
